# Patient Record
Sex: Female | Born: 1980 | Race: White | Hispanic: Yes | Marital: Single | Smoking: Never smoker
Health system: Southern US, Community
[De-identification: ages and names within clinical notes are randomized; demographics above are authoritative.]

## PROBLEM LIST (undated history)

## (undated) ENCOUNTER — Ambulatory Visit: Admission: EM

## (undated) DIAGNOSIS — R87629 Unspecified abnormal cytological findings in specimens from vagina: Secondary | ICD-10-CM

## (undated) DIAGNOSIS — R51 Headache: Secondary | ICD-10-CM

## (undated) DIAGNOSIS — O24419 Gestational diabetes mellitus in pregnancy, unspecified control: Secondary | ICD-10-CM

## (undated) DIAGNOSIS — E039 Hypothyroidism, unspecified: Secondary | ICD-10-CM

## (undated) DIAGNOSIS — R519 Headache, unspecified: Secondary | ICD-10-CM

## (undated) DIAGNOSIS — A609 Anogenital herpesviral infection, unspecified: Secondary | ICD-10-CM

## (undated) DIAGNOSIS — O139 Gestational [pregnancy-induced] hypertension without significant proteinuria, unspecified trimester: Secondary | ICD-10-CM

## (undated) DIAGNOSIS — D649 Anemia, unspecified: Secondary | ICD-10-CM

## (undated) HISTORY — DX: Anogenital herpesviral infection, unspecified: A60.9

---
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
## 1999-04-26 ENCOUNTER — Inpatient Hospital Stay (HOSPITAL_COMMUNITY): Admission: AD | Admit: 1999-04-26 | Discharge: 1999-04-26 | Payer: Self-pay | Admitting: *Deleted

## 1999-04-28 ENCOUNTER — Inpatient Hospital Stay (HOSPITAL_COMMUNITY): Admission: AD | Admit: 1999-04-28 | Discharge: 1999-04-28 | Payer: Self-pay | Admitting: Obstetrics

## 1999-05-01 ENCOUNTER — Inpatient Hospital Stay (HOSPITAL_COMMUNITY): Admission: AD | Admit: 1999-05-01 | Discharge: 1999-05-01 | Payer: Self-pay | Admitting: *Deleted

## 1999-05-10 ENCOUNTER — Inpatient Hospital Stay (HOSPITAL_COMMUNITY): Admission: AD | Admit: 1999-05-10 | Discharge: 1999-05-10 | Payer: Self-pay | Admitting: Obstetrics & Gynecology

## 1999-05-11 ENCOUNTER — Encounter (HOSPITAL_COMMUNITY): Admission: RE | Admit: 1999-05-11 | Discharge: 1999-05-17 | Payer: Self-pay | Admitting: *Deleted

## 1999-05-14 ENCOUNTER — Inpatient Hospital Stay (HOSPITAL_COMMUNITY): Admission: AD | Admit: 1999-05-14 | Discharge: 1999-05-20 | Payer: Self-pay | Admitting: Obstetrics

## 1999-05-14 ENCOUNTER — Encounter (INDEPENDENT_AMBULATORY_CARE_PROVIDER_SITE_OTHER): Payer: Self-pay

## 1999-05-22 ENCOUNTER — Inpatient Hospital Stay (HOSPITAL_COMMUNITY): Admission: AD | Admit: 1999-05-22 | Discharge: 1999-05-22 | Payer: Self-pay | Admitting: *Deleted

## 2001-06-23 ENCOUNTER — Encounter: Payer: Self-pay | Admitting: *Deleted

## 2001-06-23 ENCOUNTER — Ambulatory Visit (HOSPITAL_COMMUNITY): Admission: RE | Admit: 2001-06-23 | Discharge: 2001-06-23 | Payer: Self-pay | Admitting: *Deleted

## 2001-08-16 ENCOUNTER — Inpatient Hospital Stay (HOSPITAL_COMMUNITY): Admission: AD | Admit: 2001-08-16 | Discharge: 2001-08-16 | Payer: Self-pay | Admitting: *Deleted

## 2001-08-18 ENCOUNTER — Encounter: Payer: Self-pay | Admitting: *Deleted

## 2001-08-18 ENCOUNTER — Ambulatory Visit (HOSPITAL_COMMUNITY): Admission: RE | Admit: 2001-08-18 | Discharge: 2001-08-18 | Payer: Self-pay | Admitting: *Deleted

## 2001-09-29 ENCOUNTER — Ambulatory Visit (HOSPITAL_COMMUNITY): Admission: RE | Admit: 2001-09-29 | Discharge: 2001-09-29 | Payer: Self-pay | Admitting: *Deleted

## 2001-10-06 ENCOUNTER — Encounter (HOSPITAL_COMMUNITY): Admission: RE | Admit: 2001-10-06 | Discharge: 2001-10-22 | Payer: Self-pay | Admitting: *Deleted

## 2001-10-18 ENCOUNTER — Inpatient Hospital Stay (HOSPITAL_COMMUNITY): Admission: AD | Admit: 2001-10-18 | Discharge: 2001-10-18 | Payer: Self-pay | Admitting: *Deleted

## 2001-10-26 ENCOUNTER — Inpatient Hospital Stay (HOSPITAL_COMMUNITY): Admission: AD | Admit: 2001-10-26 | Discharge: 2001-10-30 | Payer: Self-pay | Admitting: *Deleted

## 2002-01-12 ENCOUNTER — Encounter (INDEPENDENT_AMBULATORY_CARE_PROVIDER_SITE_OTHER): Payer: Self-pay | Admitting: Specialist

## 2002-01-12 ENCOUNTER — Encounter: Admission: RE | Admit: 2002-01-12 | Discharge: 2002-01-12 | Payer: Self-pay | Admitting: *Deleted

## 2002-01-12 ENCOUNTER — Other Ambulatory Visit: Admission: RE | Admit: 2002-01-12 | Discharge: 2002-01-12 | Payer: Self-pay | Admitting: *Deleted

## 2004-03-07 ENCOUNTER — Other Ambulatory Visit: Admission: RE | Admit: 2004-03-07 | Discharge: 2004-03-07 | Payer: Self-pay | Admitting: Gynecology

## 2005-04-03 ENCOUNTER — Other Ambulatory Visit: Admission: RE | Admit: 2005-04-03 | Discharge: 2005-04-03 | Payer: Self-pay | Admitting: Gynecology

## 2007-01-13 ENCOUNTER — Inpatient Hospital Stay (HOSPITAL_COMMUNITY): Admission: AD | Admit: 2007-01-13 | Discharge: 2007-01-13 | Payer: Self-pay | Admitting: Obstetrics and Gynecology

## 2007-04-07 ENCOUNTER — Inpatient Hospital Stay (HOSPITAL_COMMUNITY): Admission: AD | Admit: 2007-04-07 | Discharge: 2007-04-07 | Payer: Self-pay | Admitting: Obstetrics and Gynecology

## 2007-05-09 ENCOUNTER — Inpatient Hospital Stay (HOSPITAL_COMMUNITY): Admission: AD | Admit: 2007-05-09 | Discharge: 2007-05-10 | Payer: Self-pay | Admitting: Obstetrics & Gynecology

## 2007-05-15 ENCOUNTER — Inpatient Hospital Stay (HOSPITAL_COMMUNITY): Admission: AD | Admit: 2007-05-15 | Discharge: 2007-05-15 | Payer: Self-pay | Admitting: Obstetrics and Gynecology

## 2007-06-01 ENCOUNTER — Inpatient Hospital Stay (HOSPITAL_COMMUNITY): Admission: RE | Admit: 2007-06-01 | Discharge: 2007-06-03 | Payer: Self-pay | Admitting: Obstetrics and Gynecology

## 2010-11-23 NOTE — Discharge Summary (Signed)
Marisa Campbell, Marisa Campbell              ACCOUNT NO.:  [id number]   MEDICAL RECORD NO.:  [id number]          PATIENT TYPE:  INP   LOCATION:  9166                          FACILITY:  [facility]   PHYSICIAN:  Gerrit Friends. Aldona Bar, M.D.   DATE OF BIRTH:  December 26, 1980   DATE OF ADMISSION:  05/09/2007  DATE OF DISCHARGE:  05/10/2007                               DISCHARGE SUMMARY   FINAL DIAGNOSES:  1. Intrauterine gestation at 36-1/2 weeks.  2. Threatened labor.   COMPLICATIONS:  None.   This 30 year old, G4, P 2-0-1-2, presents at 36-1/[redacted] weeks gestation in  questionable labor.  The patient was monitored in the hospital for about  9 hours with no change in her cervix. At this point, Dr. Aldona Bar realized  the patient was not actually in labor.  She was able to be sent home and  follow up in our office the next day for her routine office visit.   Labs:  On discharge, the patient had a hemoglobin of 12.4, white blood  cell count of 10.5 and platelets of 212,000.      Leilani Able, P.A.-C.      Gerrit Friends. Aldona Bar, M.D.  Electronically Signed    MB/MEDQ  D:  06/26/2007  T:  06/27/2007  Job:  [id number]

## 2010-11-23 NOTE — Op Note (Signed)
[facility]  Patient:    Marisa Campbell                      MRN: [id number] Proc. Date: 05/17/99 Adm. Date:  [id number] Attending:  Tammi Sou                           Operative Report  PREOPERATIVE DIAGNOSES:       1. Forty-week intrauterine pregnancy.                               2. Meconium-stained fluid.                               3. Chorioamnionitis.                               4. Arrested descent, persistent occiput posterior.  POSTOPERATIVE DIAGNOSES:      1. Forty-week intrauterine pregnancy.                               2. Meconium-stained fluid.                               3. Chorioamnionitis.                               4. Arrested descent, persistent occiput posterior.                               5. Large for gestational age infant.  PROCEDURE:                    Primary low transverse cesarean section via Pfannenstiel.  SURGEON:                      Roseanna Rainbow, M.D.                               Charles A. Clearance Coots, M.D.  ANESTHESIA:                   Epidural.  COMPLICATIONS:                None.  ESTIMATED BLOOD LOSS:         800 ccs.  FLUIDS:                       1500 cc lactated Ringers.  URINE OUTPUT:                 300 cc clear urine.  INDICATIONS:                  The patient is a 30 year old gravida 1, para 0 at 40 weeks induced for oligohydramnios.  Maximum dilatation fully dilated with descent to 0 to +1 station.  FINDINGS:                     Female  infant, cephalic, presentation occipitoposterior.  Suspect meconium of the cord.  Pediatrics were present at delivery.  Apgars were 9 and 9.  Weight 9 pounds.  Normal uterus, tubes and ovaries.  DESCRIPTION OF PROCEDURE:     The patient was taken to the operating room where her epidural anesthetic was found to be adequate.  She was then prepped and draped n the usual sterile fashion in the dorsal supine position with  leftward tilt.  A Pfannenstiel skin incision was then made with the scalpel and carried through to the underlying layer of fascia.  The fascia was entered in the midline and incision extended laterally with Mayo scissors.  Superior aspect of the fascial incision was then grasped with Kocher clamps, elevated and the underlying rectus muscles dissected off.  Attention was then turned to the inferior aspect of the incision which was manipulated in a similar fashion.  The rectus muscles were separated n the midline and the parietal peritoneum identified, tented up and entered sharply with Metzenbaum scissors.  The peritoneal incision was then extended superiorly and inferiorly and there was good visualization of the bladder.  The bladder blade as then inserted and the vesicouterine peritoneum identified ___ and entered sharply with Metzenbaum scissors.  The incision was extended laterally and the bladder lap created distally.  The bladder blade was then reinserted and the lower uterine segment incised in a transverse fashion with the scalpel.  The uterine incision was then extended laterally with bandage scissors.  The bladder blade was removed and the infants head delivered atraumatically with elevation from below. The nose and mouth were suctioned with DeLee suction and the cord was clamped and cut.  The infant was handed off the awaiting pediatricians.  Cord gases were sent.  The umbilical artery pH was 7.31.  The placenta was then removed and the uterus exteriorized and cleared of all clots and debris.  The uterine incision was noted to have two extensions, approximately 1 to 2 cm in length involving the lateral  lower margins of the uterine incision.  The uterine incision including these extensions was repaired with 0 Monocryl in running lock fashion.  Excellent hemostasis was noted.  The gutters were cleared of all clots and the fascia was  reapproximated with 0 Vicryl in a  running fashion.  The skin was closed with staples. The patient tolerated the procedure well. Sponge and needle counts were correct x 2.  The patient is taken to the PACU in stable condition. DD:  05/17/99 TD:  05/20/99 Job: [id number]

## 2010-11-23 NOTE — Discharge Summary (Signed)
[facility]  Patient:    Marisa Campbell, Marisa Campbell A Visit Number: [id number] MRN: [id number]          Service Type: OBS Location: 910A 9104 01 Attending Physician:  Enid Cutter Dictated by:   Emelda Fear, M.D. Admit Date:  10/26/2001 Discharge Date: 10/30/2001                             Discharge Summary  DATE OF BIRTH:                11-Nov-1980  PROCEDURES:                   Transfusion of two units of packed red blood                               cells.  CONSULTS:                     None.  DISCHARGE DIAGNOSES:          1. Intrauterine pregnancy at 39-6/[redacted] weeks                                  gestation.                               2. Active labor.                               3. Delivery of a viable female infant via normal                                  spontaneous vaginal delivery.                               4. Postpartum hemorrhage secondary to uterine                                  atony and periurethral lacerations.                               5. Uterine atony.                               6. Bilateral periurethral lacerations.                               7. Anemia, acute blood loss.                               8. Status post transfusion.  DISCHARGE MEDICATIONS:        1. Ibuprofen 600 mg one tablet p.o. q.6h.  p.r.n. pain.                               2. Prenatal vitamins with iron one tablet p.o.                                  q.d. while breast feeding.  HOSPITAL COURSE:              Mrs. Roger Shelter is a 30 year old, G2, P1-0-0-1, presenting at 39-6/[redacted] weeks gestation in active labor.  The patient with a previous cesarean section secondary to failure to descend and desires a trial of labor after cesarean section.  The patient underwent spontaneous rupture of membranes with clear fluid return.  Delivery of a viable female infant via normal spontaneous vaginal delivery.  Infant with  Apgars of 8 at one minute and 9 at five minutes.  The patient weighted 9 pounds 9 ounces.  The patient suffered a postpartum hemorrhage secondary to uterine atony and bilateral periurethral lacerations.  Fundal massage was performed to facilitate increased uterine tone.  The patient also received Pitocin and methergine IM times one with decreased bleeding.  Periurethral lacerations were repaired to facilitate hemostasis.  The patient was continued on methergine p.o. 24 hours after delivery.  Expected blood loss was 1 liter.  The patient with a profound anemia secondary to acute blood loss due to postpartum hemorrhage.  Postpartum day two, the patient with a hemoglobin of 5.7 and symptomatic, therefore the patient received two units of packed red blood cells.  Transfusion hemoglobin of 7.4.  The patient symptomatically improved following transfusion.  The patient is breast feeding and desires an intrauterine device for contraception.  The patient was discharged on postpartum day three in stable condition.  DISCHARGE INSTRUCTIONS:  ACTIVITY:                     No restrictions.  DIET:                         No restrictions.  INSTRUCTIONS:                 The patient is recommended to return to [facility] for development of fever, vomiting, severe abdominal pain, or increased bleeding.  FOLLOW-UP:                    The patient will follow up in six weeks for postpartum visit at [facility] Dictated by:   Emelda Fear, M.D. Attending Physician:  Enid Cutter DD:  10/30/01 TD:  10/31/01 Job: [id number]

## 2011-01-10 ENCOUNTER — Other Ambulatory Visit (HOSPITAL_COMMUNITY): Payer: Self-pay | Admitting: Endocrinology

## 2011-01-10 DIAGNOSIS — E049 Nontoxic goiter, unspecified: Secondary | ICD-10-CM

## 2011-01-14 ENCOUNTER — Other Ambulatory Visit (HOSPITAL_COMMUNITY): Payer: Self-pay

## 2011-01-15 ENCOUNTER — Ambulatory Visit (HOSPITAL_COMMUNITY)
Admission: RE | Admit: 2011-01-15 | Discharge: 2011-01-15 | Disposition: A | Discharge status: Home / Self Care | Payer: Self-pay | Source: Ambulatory Visit | Attending: Endocrinology | Admitting: Endocrinology

## 2011-01-15 DIAGNOSIS — E049 Nontoxic goiter, unspecified: Secondary | ICD-10-CM | POA: Insufficient documentation

## 2011-01-15 DIAGNOSIS — R1310 Dysphagia, unspecified: Secondary | ICD-10-CM | POA: Insufficient documentation

## 2011-04-16 LAB — CROSSMATCH
ABO/RH(D): AB POS
Antibody Screen: POSITIVE
DAT, IgG: NEGATIVE
Donor AG Type: NEGATIVE

## 2011-04-16 LAB — TYPE AND SCREEN
Antibody Screen: POSITIVE
DAT, IgG: NEGATIVE
Donor AG Type: NEGATIVE

## 2011-04-16 LAB — CBC
HCT: 28.6 — ABNORMAL LOW
HCT: 35 — ABNORMAL LOW
Hemoglobin: 12
Hemoglobin: 12.4
Platelets: 179
RBC: 3.98
RDW: 13.7
WBC: 7.3
WBC: 9.1

## 2011-04-18 LAB — URINALYSIS, ROUTINE W REFLEX MICROSCOPIC
Bilirubin Urine: NEGATIVE
Ketones, ur: NEGATIVE
Nitrite: NEGATIVE
Protein, ur: NEGATIVE
Urobilinogen, UA: 0.2

## 2011-04-23 LAB — LIPASE, BLOOD: Lipase: 14

## 2011-04-23 LAB — COMPREHENSIVE METABOLIC PANEL
Alkaline Phosphatase: 41
BUN: 5 — ABNORMAL LOW
CO2: 23
GFR calc non Af Amer: >60
Glucose, Bld: 86
Potassium: 3.8
Total Protein: 6.4

## 2011-04-23 LAB — URINALYSIS, ROUTINE W REFLEX MICROSCOPIC
Glucose, UA: NEGATIVE
Hgb urine dipstick: NEGATIVE
Ketones, ur: 15 — AB
Protein, ur: NEGATIVE

## 2011-04-23 LAB — CBC
HCT: 35.4 — ABNORMAL LOW
Hemoglobin: 12.1
MCHC: 34.1
RDW: 14.8 — ABNORMAL HIGH

## 2011-10-25 ENCOUNTER — Other Ambulatory Visit (HOSPITAL_COMMUNITY): Payer: Self-pay | Admitting: *Deleted

## 2011-10-25 DIAGNOSIS — E669 Obesity, unspecified: Secondary | ICD-10-CM

## 2011-10-25 DIAGNOSIS — E0590 Thyrotoxicosis, unspecified without thyrotoxic crisis or storm: Secondary | ICD-10-CM

## 2011-11-01 ENCOUNTER — Other Ambulatory Visit (HOSPITAL_COMMUNITY): Payer: Self-pay

## 2012-06-01 ENCOUNTER — Other Ambulatory Visit (HOSPITAL_COMMUNITY): Payer: Self-pay | Admitting: *Deleted

## 2012-06-01 DIAGNOSIS — E669 Obesity, unspecified: Secondary | ICD-10-CM

## 2012-06-01 DIAGNOSIS — E0590 Thyrotoxicosis, unspecified without thyrotoxic crisis or storm: Secondary | ICD-10-CM

## 2012-06-09 ENCOUNTER — Ambulatory Visit (HOSPITAL_COMMUNITY)
Admission: RE | Admit: 2012-06-09 | Discharge: 2012-06-09 | Disposition: A | Discharge status: Home / Self Care | Payer: Self-pay | Source: Ambulatory Visit | Attending: *Deleted | Admitting: *Deleted

## 2012-06-09 DIAGNOSIS — E039 Hypothyroidism, unspecified: Secondary | ICD-10-CM | POA: Insufficient documentation

## 2012-06-09 DIAGNOSIS — E049 Nontoxic goiter, unspecified: Secondary | ICD-10-CM | POA: Insufficient documentation

## 2012-06-09 DIAGNOSIS — E669 Obesity, unspecified: Secondary | ICD-10-CM

## 2012-06-09 DIAGNOSIS — E0590 Thyrotoxicosis, unspecified without thyrotoxic crisis or storm: Secondary | ICD-10-CM

## 2016-05-17 DIAGNOSIS — R87612 Low grade squamous intraepithelial lesion on cytologic smear of cervix (LGSIL): Secondary | ICD-10-CM | POA: Insufficient documentation

## 2016-05-24 DIAGNOSIS — N870 Mild cervical dysplasia: Secondary | ICD-10-CM | POA: Insufficient documentation

## 2017-03-25 LAB — OB RESULTS CONSOLE GC/CHLAMYDIA
Chlamydia: NEGATIVE
GC PROBE AMP, GENITAL: NEGATIVE

## 2017-03-25 LAB — OB RESULTS CONSOLE ABO/RH: RH TYPE: POSITIVE

## 2017-03-25 LAB — OB RESULTS CONSOLE RPR: RPR: NONREACTIVE

## 2017-03-25 LAB — OB RESULTS CONSOLE HIV ANTIBODY (ROUTINE TESTING): HIV: NONREACTIVE

## 2017-03-25 LAB — OB RESULTS CONSOLE ANTIBODY SCREEN: Antibody Screen: POSITIVE

## 2017-03-25 LAB — OB RESULTS CONSOLE RUBELLA ANTIBODY, IGM: Rubella: IMMUNE

## 2017-03-25 LAB — OB RESULTS CONSOLE HEPATITIS B SURFACE ANTIGEN: Hepatitis B Surface Ag: NEGATIVE

## 2017-05-12 ENCOUNTER — Encounter (HOSPITAL_COMMUNITY): Payer: Self-pay

## 2017-05-13 ENCOUNTER — Other Ambulatory Visit (HOSPITAL_COMMUNITY): Payer: Self-pay

## 2017-05-13 ENCOUNTER — Ambulatory Visit (HOSPITAL_COMMUNITY)
Admission: RE | Admit: 2017-05-13 | Discharge: 2017-05-13 | Disposition: A | Discharge status: Home / Self Care | Payer: Medicaid Other | Source: Ambulatory Visit | Attending: Obstetrics and Gynecology | Admitting: Obstetrics and Gynecology

## 2017-05-13 ENCOUNTER — Encounter (HOSPITAL_COMMUNITY): Payer: Self-pay | Admitting: *Deleted

## 2017-05-13 DIAGNOSIS — O09522 Supervision of elderly multigravida, second trimester: Secondary | ICD-10-CM | POA: Insufficient documentation

## 2017-05-13 DIAGNOSIS — E039 Hypothyroidism, unspecified: Secondary | ICD-10-CM | POA: Diagnosis not present

## 2017-05-13 DIAGNOSIS — O99282 Endocrine, nutritional and metabolic diseases complicating pregnancy, second trimester: Secondary | ICD-10-CM | POA: Insufficient documentation

## 2017-05-13 DIAGNOSIS — O360920 Maternal care for other rhesus isoimmunization, second trimester, not applicable or unspecified: Secondary | ICD-10-CM | POA: Insufficient documentation

## 2017-05-13 DIAGNOSIS — Z3A19 19 weeks gestation of pregnancy: Secondary | ICD-10-CM | POA: Insufficient documentation

## 2017-05-13 DIAGNOSIS — O361110 Maternal care for Anti-A sensitization, first trimester, not applicable or unspecified: Secondary | ICD-10-CM | POA: Insufficient documentation

## 2017-05-13 DIAGNOSIS — O99012 Anemia complicating pregnancy, second trimester: Secondary | ICD-10-CM | POA: Insufficient documentation

## 2017-05-13 HISTORY — DX: Headache: R51

## 2017-05-13 HISTORY — DX: Hypothyroidism, unspecified: E03.9

## 2017-05-13 HISTORY — DX: Anemia, unspecified: D64.9

## 2017-05-13 HISTORY — DX: Headache, unspecified: R51.9

## 2017-05-13 HISTORY — DX: Unspecified abnormal cytological findings in specimens from vagina: R87.629

## 2017-05-13 NOTE — Consult Note (Signed)
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 She had this with her last pregnancy and apparently had low titers throughout. Baby did have moderate to severe neonatal jaundice. She probably was isoimmunized after blood transfusion after her second child OB History: OB History    Gravida Para Term Preterm AB Living   6 3 3   1 3    SAB TAB Ectopic Multiple Live Births   1              PMH:  Past Medical History:  Diagnosis Date  . Anemia   . Headache   . Hypothyroidism   . Vaginal Pap smear, abnormal     PSH:  Past Surgical History:  Procedure Laterality Date  . CESAREAN SECTION     Meds: PNV Allergies: NKDA FH: See EPIC Section Soc: See EPIC Section  Review of Systems: no vaginal bleeding or cramping/contractions, no LOF, no nausea/vomiting. All other systems reviewed and are negative.  PE:  VS: See EPIC Section GEN: well-appearing female ABD: gravid, NT  A/P: AMA: recommend some form of testing; she is a candidate for cell free DNA testing. If desired, we would be glad to have her see our genetic counselor Anti-c isoimmunization: FOB refuses testing for antigen status. At this time the titer is low and does not need middle cerebral artery doppler studies. These would be required if titer reached 8 or greater. Recommend checking titer again at 17 weeks. If >8 then we can begin MCA dopplers as early as 18 weeks. If it remains <8 then titers every 4 weeks are sufficient, with a referral for MCA dopplers if it ever becomes 8 or greater.  Thank you for the opportunity to be a part of the care of [employer] Motor [employer]ebecca A Dehaven. Please contact our office if we can be of further assistance.   I spent approximately 30 minutes with this patient with over 50% of time spent in face-to-face counseling.

## 2017-05-16 ENCOUNTER — Other Ambulatory Visit (HOSPITAL_COMMUNITY): Payer: Self-pay

## 2017-05-16 ENCOUNTER — Encounter (HOSPITAL_COMMUNITY): Payer: Self-pay

## 2017-07-08 NOTE — L&D Delivery Note (Signed)
Delivery Note At 3:54 AM a viable and healthy female was delivered via Vaginal, Spontaneous (Presentation: Occiput; Anterior ). Pt had precipitous delivery.  APGAR: 8, 9; weight pending .   Placenta status: spontaneous, intact.  Cord: 3V with the following complications: .    Anesthesia:  None Episiotomy: None Lacerations: None Suture Repair: NA Est. Blood Loss (mL): 250  Mom to postpartum.  Baby to Couplet care / Skin to Skin.  Waynard Reeds 11/08/2017, 4:16 AM

## 2017-07-22 DIAGNOSIS — B001 Herpesviral vesicular dermatitis: Secondary | ICD-10-CM | POA: Insufficient documentation

## 2017-10-15 LAB — OB RESULTS CONSOLE GBS: STREP GROUP B AG: NEGATIVE

## 2017-11-06 ENCOUNTER — Encounter (HOSPITAL_COMMUNITY): Payer: Self-pay | Admitting: *Deleted

## 2017-11-06 ENCOUNTER — Other Ambulatory Visit: Payer: Self-pay | Admitting: Obstetrics and Gynecology

## 2017-11-06 ENCOUNTER — Telehealth (HOSPITAL_COMMUNITY): Payer: Self-pay | Admitting: *Deleted

## 2017-11-06 NOTE — Telephone Encounter (Signed)
Preadmission screen  

## 2017-11-07 ENCOUNTER — Inpatient Hospital Stay (HOSPITAL_COMMUNITY): Admission: RE | Admit: 2017-11-07 | Payer: Medicaid Other | Source: Ambulatory Visit

## 2017-11-08 ENCOUNTER — Inpatient Hospital Stay (HOSPITAL_COMMUNITY)
Admission: AD | Admit: 2017-11-08 | Discharge: 2017-11-10 | DRG: 807 | Disposition: A | Discharge status: Home / Self Care | Payer: Medicaid Other | Source: Ambulatory Visit | Attending: Obstetrics and Gynecology | Admitting: Obstetrics and Gynecology

## 2017-11-08 ENCOUNTER — Encounter (HOSPITAL_COMMUNITY): Payer: Self-pay

## 2017-11-08 DIAGNOSIS — O34219 Maternal care for unspecified type scar from previous cesarean delivery: Secondary | ICD-10-CM | POA: Diagnosis present

## 2017-11-08 DIAGNOSIS — Z3A39 39 weeks gestation of pregnancy: Secondary | ICD-10-CM | POA: Diagnosis not present

## 2017-11-08 DIAGNOSIS — E039 Hypothyroidism, unspecified: Secondary | ICD-10-CM | POA: Diagnosis present

## 2017-11-08 DIAGNOSIS — Z3483 Encounter for supervision of other normal pregnancy, third trimester: Secondary | ICD-10-CM | POA: Diagnosis present

## 2017-11-08 DIAGNOSIS — O99284 Endocrine, nutritional and metabolic diseases complicating childbirth: Secondary | ICD-10-CM | POA: Diagnosis present

## 2017-11-08 LAB — CBC
HCT: 31.5 % — ABNORMAL LOW (ref 36.0–46.0)
HCT: 34.9 % — ABNORMAL LOW (ref 36.0–46.0)
Hemoglobin: 11.1 g/dL — ABNORMAL LOW (ref 12.0–15.0)
Hemoglobin: 12.2 g/dL (ref 12.0–15.0)
MCH: 29.6 pg (ref 26.0–34.0)
MCH: 29.8 pg (ref 26.0–34.0)
MCHC: 35 g/dL (ref 30.0–36.0)
MCHC: 35.2 g/dL (ref 30.0–36.0)
MCV: 84 fL (ref 78.0–100.0)
MCV: 85.1 fL (ref 78.0–100.0)
PLATELETS: 167 10*3/uL (ref 150–400)
PLATELETS: 173 10*3/uL (ref 150–400)
RBC: 3.75 MIL/uL — ABNORMAL LOW (ref 3.87–5.11)
RBC: 4.1 MIL/uL (ref 3.87–5.11)
RDW: 13.5 % (ref 11.5–15.5)
RDW: 13.6 % (ref 11.5–15.5)
WBC: 10.3 10*3/uL (ref 4.0–10.5)
WBC: 8.3 10*3/uL (ref 4.0–10.5)

## 2017-11-08 LAB — TYPE AND SCREEN
ABO/RH(D): AB POS
ANTIBODY SCREEN: POSITIVE

## 2017-11-08 LAB — RPR: RPR Ser Ql: NONREACTIVE

## 2017-11-08 MED ORDER — BUTORPHANOL TARTRATE 1 MG/ML IJ SOLN
1.0000 mg | INTRAMUSCULAR | Status: DC | PRN
  Administered 2017-11-08: 1 mg via INTRAVENOUS
  Filled 2017-11-08 (×2): qty 1

## 2017-11-08 MED ORDER — LEVOTHYROXINE SODIUM 150 MCG PO TABS
150.0000 ug | ORAL_TABLET | Freq: Every day | ORAL | Status: DC
  Administered 2017-11-08 – 2017-11-10 (×3): 150 ug via ORAL
  Filled 2017-11-08 (×4): qty 1

## 2017-11-08 MED ORDER — SOD CITRATE-CITRIC ACID 500-334 MG/5ML PO SOLN
30.0000 mL | ORAL | Status: DC | PRN
  Administered 2017-11-08: 30 mL via ORAL
  Filled 2017-11-08: qty 15

## 2017-11-08 MED ORDER — OXYCODONE-ACETAMINOPHEN 5-325 MG PO TABS: 1.0000 | ORAL_TABLET | ORAL | Status: DC | PRN

## 2017-11-08 MED ORDER — COCONUT OIL OIL
1.0000 "application " | TOPICAL_OIL | Status: DC | PRN
  Administered 2017-11-10: 1 via TOPICAL
  Filled 2017-11-08: qty 120

## 2017-11-08 MED ORDER — PRENATAL MULTIVITAMIN CH
1.0000 | ORAL_TABLET | Freq: Every day | ORAL | Status: DC
  Administered 2017-11-08 – 2017-11-10 (×3): 1 via ORAL
  Filled 2017-11-08 (×3): qty 1

## 2017-11-08 MED ORDER — BUTORPHANOL TARTRATE 1 MG/ML IJ SOLN
1.0000 mg | INTRAMUSCULAR | Status: DC | PRN
  Administered 2017-11-08: 1 mg via INTRAVENOUS

## 2017-11-08 MED ORDER — OXYCODONE-ACETAMINOPHEN 5-325 MG PO TABS: 2.0000 | ORAL_TABLET | ORAL | Status: DC | PRN

## 2017-11-08 MED ORDER — METHYLERGONOVINE MALEATE 0.2 MG PO TABS: 0.2000 mg | ORAL_TABLET | ORAL | Status: DC | PRN

## 2017-11-08 MED ORDER — SIMETHICONE 80 MG PO CHEW: 80.0000 mg | CHEWABLE_TABLET | ORAL | Status: DC | PRN

## 2017-11-08 MED ORDER — BENZOCAINE-MENTHOL 20-0.5 % EX AERO: 1.0000 "application " | INHALATION_SPRAY | CUTANEOUS | Status: DC | PRN

## 2017-11-08 MED ORDER — LIDOCAINE HCL (PF) 1 % IJ SOLN
30.0000 mL | INTRAMUSCULAR | Status: DC | PRN
  Filled 2017-11-08: qty 30

## 2017-11-08 MED ORDER — ACETAMINOPHEN 325 MG PO TABS: 650.0000 mg | ORAL_TABLET | ORAL | Status: DC | PRN

## 2017-11-08 MED ORDER — SENNOSIDES-DOCUSATE SODIUM 8.6-50 MG PO TABS
2.0000 | ORAL_TABLET | ORAL | Status: DC
  Administered 2017-11-09 (×2): 2 via ORAL
  Filled 2017-11-08 (×2): qty 2

## 2017-11-08 MED ORDER — METHYLERGONOVINE MALEATE 0.2 MG/ML IJ SOLN: 0.2000 mg | INTRAMUSCULAR | Status: DC | PRN

## 2017-11-08 MED ORDER — ZOLPIDEM TARTRATE 5 MG PO TABS: 5.0000 mg | ORAL_TABLET | Freq: Every evening | ORAL | Status: DC | PRN

## 2017-11-08 MED ORDER — IBUPROFEN 600 MG PO TABS
600.0000 mg | ORAL_TABLET | Freq: Four times a day (QID) | ORAL | Status: DC
  Administered 2017-11-08 – 2017-11-10 (×9): 600 mg via ORAL
  Filled 2017-11-08 (×11): qty 1

## 2017-11-08 MED ORDER — ONDANSETRON HCL 4 MG/2ML IJ SOLN: 4.0000 mg | INTRAMUSCULAR | Status: DC | PRN

## 2017-11-08 MED ORDER — TETANUS-DIPHTH-ACELL PERTUSSIS 5-2.5-18.5 LF-MCG/0.5 IM SUSP: 0.5000 mL | Freq: Once | INTRAMUSCULAR | Status: DC

## 2017-11-08 MED ORDER — LACTATED RINGERS IV SOLN
INTRAVENOUS | Status: DC
  Administered 2017-11-08: 01:00:00 via INTRAVENOUS

## 2017-11-08 MED ORDER — ACETAMINOPHEN 325 MG PO TABS
650.0000 mg | ORAL_TABLET | ORAL | Status: DC | PRN
  Administered 2017-11-08 – 2017-11-09 (×2): 650 mg via ORAL
  Filled 2017-11-08 (×2): qty 2

## 2017-11-08 MED ORDER — ONDANSETRON HCL 4 MG/2ML IJ SOLN: 4.0000 mg | Freq: Four times a day (QID) | INTRAMUSCULAR | Status: DC | PRN

## 2017-11-08 MED ORDER — DIBUCAINE 1 % RE OINT: 1.0000 "application " | TOPICAL_OINTMENT | RECTAL | Status: DC | PRN

## 2017-11-08 MED ORDER — OXYTOCIN BOLUS FROM INFUSION
500.0000 mL | Freq: Once | INTRAVENOUS | Status: AC
  Administered 2017-11-08: 500 mL via INTRAVENOUS

## 2017-11-08 MED ORDER — ONDANSETRON HCL 4 MG PO TABS: 4.0000 mg | ORAL_TABLET | ORAL | Status: DC | PRN

## 2017-11-08 MED ORDER — WITCH HAZEL-GLYCERIN EX PADS: 1.0000 "application " | MEDICATED_PAD | CUTANEOUS | Status: DC | PRN

## 2017-11-08 MED ORDER — DIPHENHYDRAMINE HCL 25 MG PO CAPS: 25.0000 mg | ORAL_CAPSULE | Freq: Four times a day (QID) | ORAL | Status: DC | PRN

## 2017-11-08 MED ORDER — OXYTOCIN 40 UNITS IN LACTATED RINGERS INFUSION - SIMPLE MED
2.5000 [IU]/h | INTRAVENOUS | Status: DC
  Filled 2017-11-08: qty 1000

## 2017-11-08 MED ORDER — FLEET ENEMA 7-19 GM/118ML RE ENEM: 1.0000 | ENEMA | RECTAL | Status: DC | PRN

## 2017-11-08 MED ORDER — LACTATED RINGERS IV SOLN: 500.0000 mL | INTRAVENOUS | Status: DC | PRN

## 2017-11-08 NOTE — H&P (Signed)
Marisa Campbell is a 37 y.o. female presenting for labor  37 yo [id number] @ 39+3 presents for labor. 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 Her husband declined to have his blood tested to see if he was a carrier of either of these antigens. She was followed with serial titers. The highest her titer got was 1:4. She has a history of prior C/S and then 2 successful VBACs OB History    Gravida  6   Para  3   Term  3   Preterm      AB  2   Living  3     SAB  1   TAB  1   Ectopic      Multiple      Live Births  3          Past Medical History:  Diagnosis Date  . Anemia   . Headache   . HSV (herpes simplex virus) anogenital infection   . Hypothyroidism   . Vaginal Pap smear, abnormal    Past Surgical History:  Procedure Laterality Date  . CESAREAN SECTION     Family History: family history includes Hypertension in her father. Social History:  reports that she has never smoked. She has never used smokeless tobacco. She reports that she does not drink alcohol or use drugs.     Maternal Diabetes: No Genetic Screening: Declined Maternal Ultrasounds/Referrals: Normal Fetal Ultrasounds or other Referrals:  Referred to Materal Fetal Medicine  Maternal Substance Abuse:  No Significant Maternal Medications:  Meds include: Syntroid Significant Maternal Lab Results:  Lab values include: Other: Anti little c, Anti E Other Comments:  None  ROS History Dilation: 10 Effacement (%): 80 Station: -2 Exam by:: K.Wilson,RN Blood pressure (!) 142/79, pulse 65, temperature 98.7 F (37.1 C), temperature source Oral, resp. rate 18, last menstrual period 02/05/2017. Exam Physical Exam  Prenatal labs: ABO, Rh: --/--/AB POS (05/04 0102) Antibody: POS (05/04 0102) Rubella: Immune (09/18 0000) RPR: Nonreactive (09/18 0000)  HBsAg: Negative (09/18 0000)  HIV: Non-reactive (09/18 0000)  GBS:  Negative (04/10 0000)   Assessment/Plan: 1) Admit 2) Epidural on request 3) TOLAC   Waynard Reeds 11/08/2017, 4:08 AM

## 2017-11-08 NOTE — MAU Note (Signed)
Pt reports ctx since this morning. Closer now. 5cm in office/.Reports she had some blood when wiping. Good fetal movement

## 2017-11-09 NOTE — Lactation Note (Signed)
This note was copied from a baby's chart. Lactation Consultation Note  Patient Name: Marisa Campbell [name] Date: 11/09/2017    [facility] Visit for NICU baby: Mother not in bed and support person asleep: will try to check back later.         Marisa Campbell 11/09/2017, 12:45 AM

## 2017-11-09 NOTE — Progress Notes (Signed)
Post Partum Day 0 Subjective: no complaints, up ad lib, voiding and tolerating PO  Objective: Blood pressure 118/76, pulse (!) 56, temperature 98 F (36.7 C), temperature source Oral, resp. rate 20, last menstrual period 02/05/2017, SpO2 100 %.  Physical Exam:  General: alert, cooperative and appears stated age Lochia: appropriate Uterine Fundus: firm   Recent Labs    11/08/17 0050 11/08/17 0726  HGB 12.2 11.1*  HCT 34.9* 31.5*    Assessment/Plan: Routine PP care Baby in NICU due to severely elevated bilirubin   LOS: 1 day   Waynard Reeds 11/09/2017, 12:02 PM

## 2017-11-09 NOTE — Lactation Note (Signed)
This note was copied from a baby's chart. Lactation Consultation Note  Patient Name: Marisa Campbell [name] Date: 11/09/2017   [facility] Visit for NICU Baby:  Second attempt to visit with mother. She is not currently in her room.               [place]      Irene Pap Torion Hulgan 11/09/2017, 3:32 AM

## 2017-11-10 ENCOUNTER — Encounter (HOSPITAL_COMMUNITY): Payer: Self-pay | Admitting: *Deleted

## 2017-11-10 MED ORDER — POLYETHYLENE GLYCOL 3350 17 G PO PACK: 17.0000 g | PACK | Freq: Every day | ORAL | 0 refills | Status: DC

## 2017-11-10 MED ORDER — POLYETHYLENE GLYCOL 3350 17 G PO PACK
17.0000 g | PACK | Freq: Every day | ORAL | Status: DC
  Administered 2017-11-10: 17 g via ORAL
  Filled 2017-11-10 (×2): qty 1

## 2017-11-10 NOTE — Progress Notes (Signed)
Patient is doing well.  She is ambulating, voiding, tolerating PO.  Pain control is good.  Lochia is appropriate Reports baby's bilirubin improving in NICU  Vitals:   11/08/17 0640 11/08/17 1117 11/08/17 1820 11/09/17 0542  BP: 128/73 (!) 102/54 113/74 118/76  Pulse: (!) 57 66 68 (!) 56  Resp: Temp: 97.8 F (36.6 C) 98.8 F (37.1 C) 98.2 F (36.8 C) 98 F (36.7 C)  TempSrc: Oral Oral Axillary Oral  SpO2:   100%     NAD Fundus firm Ext: trace edema b/l  Lab Results  Component Value Date   WBC 10.3 11/08/2017   HGB 11.1 (L) 11/08/2017   HCT 31.5 (L) 11/08/2017   MCV 84.0 11/08/2017   PLT 173 11/08/2017    --/--/AB POS (05/04 0102)/RImmune  A/P 36 y.o. [id number] PPD#2. Routine care.   Meeting all goals--d/c to home today    [facility] GEFFEL Norman

## 2017-11-10 NOTE — Lactation Note (Addendum)
This note was copied from a baby's chart. Lactation Consultation Note  Patient Name: Marisa Campbell [name] Date: 11/10/2017 Reason for consult: Initial assessment;NICU baby;Maternal endocrine disorder Type of Endocrine Disorder?: Thyroid(Levothyroxine)   Initial assessment with Exp BF mom of 57 hour old NICU infant. Mom is pumping about every 3 hours and getting about 20 mol of EBM /pumping. Mom is concerned that her milk is not as abundant as it was with there first 2 children. She was diagnosed with Thyroid issues after 2nd child. She reports she has not had her levels checked in a while.   Mom is feeling fuller but no signs of engorgement today. Mom to tell nurses if she gets to feeling fuller to ask for ice.   Mom reports her nipples are sore with initial pumping. She does have a red ring around her nipple. She is using # 27 flanges and are thought to be the right fit for her. Enc mom to use Coconut oil to nipples prior to pumping and EBM and comfort gels to nipples post pumping/feeding. Comfort gels given with instructions for use.   Mom is planning to apply for [facility], spoke with [facility] reps and asked them to stop by and see mom about applying for [facility]. Attempted to fax [facility] referral, would not send at this time, will resend later today.   BF Resources handout and LC Brochure given, mom informed of IP/OP Services, BF Support Groups and LC phone #. Providing Milk for your Infant in NICU booklet given,   Mom was called to NICU to feed infant. She reports all questions/concenrs have been answered. Report to Wonda Horner, Charity fundraiser. Susie to take mom Coconut oil when mom returns.    Maternal Data Formula Feeding for Exclusion: No Has patient been taught Hand Expression?: Yes Does the patient have breastfeeding experience prior to this delivery?: Yes  Feeding    LATCH Score                   Interventions    Lactation Tools Discussed/Used WIC Program: No Pump Review: Setup,  frequency, and cleaning;Milk Storage Initiated by:: Reviewed and encouraged 8-12 x in 24 hours   Consult Status Consult Status: Follow-up Date: 11/11/17 Follow-up type: In-patient    Marisa Campbell Marisa Campbell 11/10/2017, 1:41 PM

## 2017-11-10 NOTE — Discharge Summary (Signed)
Obstetric Discharge Summary Reason for Admission: onset of labor Prenatal Procedures: none Intrapartum Procedures: spontaneous vaginal delivery Postpartum Procedures: none Complications-Operative and Postpartum: none Hemoglobin  Date Value Ref Range Status  11/08/2017 11.1 (L) 12.0 - 15.0 g/dL Final   HCT  Date Value Ref Range Status  11/08/2017 31.5 (L) 36.0 - 46.0 % Final    Physical Exam:  General: alert, cooperative and appears stated age 37: appropriate Uterine Fundus: firm DVT Evaluation: No evidence of DVT seen on physical exam.  Discharge Diagnoses: Term Pregnancy-delivered  Discharge Information: Date: 11/10/2017 Activity: pelvic rest Diet: routine Medications: PNV and Ibuprofen Condition: stable Instructions: refer to practice specific booklet Discharge to: home Follow-up Information    Marisa Reeds, Marisa Campbell Follow up in 4 week(s).   Specialty:  Obstetrics and Gynecology Contact information: [address] [place] [address] [phone]           Newborn Data: Live born female  Birth Weight: 8 lb 3.8 oz (3735 g) APGAR: 8, 9  Newborn Delivery   Birth date/time:  11/08/2017 03:54:00 Delivery type:  Vaginal, Spontaneous     Baby in  NICU w elevated bilirubin  Marisa Campbell Marisa Campbell 11/10/2017, 9:24 AM

## 2017-12-04 ENCOUNTER — Ambulatory Visit: Payer: Self-pay

## 2017-12-04 NOTE — Lactation Note (Addendum)
This note was copied from a baby's chart.  12/04/2017  Name: Marisa Campbell MRN: [id number] Date of Birth: 11/08/2018 Gestational Age: Gestational Age: [redacted]w[redacted]d Birth Weight: 131.8 oz Weight today:    9 pounds 1.3 ounces (4120 grams) with clean newborn diaper  Infant has gained 150 grams in the last 23 days with an average daily weight gain of 6 grams a day. Mom reports infant's lowest weight was 8 pounds 4 ounces on 5/17 at the [facility] office. With that in mind infant has gained 9 ounces in 13 days with an average daily weight gain of 0.7 ounces a day.   Marisa Campbell presents today with mom for feeding assessment due to weight gain. Mom is concerned that infant was not getting enough food. She reports since beginning Fenugreek last week she has noticed that infant is more satisfied and is sleeping better. Mom reports infant was not stooling last week but stools have increased in the last several days. Infant last ate at 7 am.   Mom reports infant is sleepy at the breast and takes up to an hour to feed. Enc mom to keep infant awake at the breast and to massage/compress breast with feeding.   Mom is pumping once a day with a manual pump and getting about an ounce, she is feeding to infant along with 1 bottle of formula bottle once a day with a Dr. Theora Gianotti bottle. Usually given in the evening when dad is at home. Mom has manual pump and has not been able to get to [facility] to get a DEBP due to taking care of sick family member.   Infant with thick labial frenulum that inserts at the bottom of the gum ridge. Upper lip is tight with flanging and on the breast. Infant with limited mid tongue elevation and bowl shaped tongue with crying. Infant with good tongue extension and lateralization. Mom's right nipple is asymmetrical after latch. Infant tongue thrusting and chomping on gloved finger, infant with poor seal on gloved finger with easy release with tension. Infant with hump to back of tongue when suckling  on gloved finger. Reviewed how tongue and lip restrictions may effect BF. Mom given website and local provider information. Mom reports 2 of her older children had to have tongue tie revised by Pediatrician.   Mom latched infant to left breast in the football hold. Infant sleepy at the breast and needed stimulation to maintain suckling. Infant transferred 40 ml on the left breast over 20 minutes. Infant was then latched to the right breast, he was more unsettled on the right breast. Infant transferred 8 ml on the right breast. He became fussy and did not want to continue feeding  Reviewed with mom that infant weight gain is not optimal, enc mom to offer bottle of EBM or formula post BF if infant still cueing to feed. Gave mom 5 french feeding tube to try at home with instructions for use and cleaning. Enc mom to offer supplement with each feeding until weight gain pattern improves. Strongly advised mom to rent DEBP. Mom rented [facility] loaner today. Enc mom to pump to stimulate supply and use EBM to feed infant.   Mom reports she is unsure how long ago her Thyroid levels were checked, mom to speak with OB at her follow up visit on June 3rd.   Infant to follow up with Pediatrician on June 12 th. Infant to follow up with Lactation in 1 week or 1-2 days post revisions if completed.  General Information: Mother's reason for visit: Feeding assessment, milk supply issues Consult: Initial Lactation consultant: Noralee Stain RN,IBCLC Breastfeeding experience: going better since starting Fenugreek, sleepy at the breast Maternal medical conditions: Thyroid(Hypothyroidism) Maternal medications: Pre-natal vitamin, Other(Fenugreek 610 ml 3 capsules TID, Levothyroxine 175 mcg qd)  Breastfeeding History: Frequency of breast feeding: every 2 hours Duration of feeding: 45- 1 hour  Supplementation: Supplement method: bottle(Dr. Brown's ) Brand: Enfamil Formula volume: 2-4 oz Formula frequency: 1 x a  day Total formula volume per day: 2-4 ounces Breast milk volume: 1 ounce  Breast milk frequency: once a day   Pump type: Manual Pump frequency: 1 x a day Pump volume: 1 oz  Infant Output Assessment: Voids per 24 hours: 8+ Urine color: Clear yellow Stools per 24 hours: 8+ Stool color: Yellow  Breast Assessment: Breast: Soft Nipple: Erect Pain level: 0 Pain interventions: Bra  Feeding Assessment: Infant oral assessment: Variance Infant oral assessment comment: Infant with thick labial frenulum that inserts at the bottom of the gum ridge. Upper lip is tight with flanging and on the breast. Infant with limited mid tongue elevation and bowl shaped tongue with crying. Infant with good tongue extension and lateralization. Mom's right nipple is asymmetrical after latch. Infant tongue thrusting and chomping on gloved finger, infant with poor seal on gloved finger with easy release with tension. Infant with hump to back of tongue when suckling on gloved finger. Reviewed how tongue and lip restrictions may effect BF.  Positioning: Cradle Latch: 2 - Grasps breast easily, tongue down, lips flanged, rhythmical sucking. Audible swallowing: 2 - Spontaneous and intermittent Type of nipple: 2 - Everted at rest and after stimulation Comfort: 2 - Soft/non-tender Hold: 2 - No assistance needed to correctly position infant at breast LATCH score: 10 Latch assessment: Deep Lips flanged: No(upper lip tight at the breast) Suck assessment: Displays both   Pre-feed weight: 4120 grams Post feed weight: 4160 grams Amount transferred: 40 ml Amount supplemented: 0  Additional Feeding Assessment: Infant oral assessment: Variance Infant oral assessment comment: see above Positioning: Cradle Latch: 1 - Repeated attempts neede to sustain latch, nipple held in mouth throughout feeding, stimulation needed to elicit sucking reflex. Audible swallowing: 2 - Spontaneous and intermittent Type of nipple: 2 -  Everted at rest and after stimulation Comfort: 2 - Soft/non-tender Hold: 2 - No assistance needed to correctly position infant at breast LATCH score: 9 Latch assessment: Deep Lips flanged: No Suck assessment: Displays both   Pre-feed weight: 4160 grams /4152 grams post diaper change Post feed weight: 4168 grams/4158 grams post diaper change Amount transferred: 8 ml/6 ml Amount supplemented: 14 ml  Totals: Total amount transferred: 54 ml Total supplement given: 0, did not have his kind of fomula available.  Total amount pumped post feed: 0   Ed Blalock RN, IBCLC  Addendeum 14:47 pm-Following appointment  mom was taken to clinic to check out and make follow up appt. LC left to take care of another patient. Mom did not check out, make follow up appt, or  get a print out of her AVS Summary. Copy of AVS printed and mailed to mom.                                                            Marisa Campbell  Marisa Campbell 12/04/2017, 8:46 AM

## 2018-11-17 ENCOUNTER — Other Ambulatory Visit (HOSPITAL_COMMUNITY): Payer: Self-pay | Admitting: Obstetrics and Gynecology

## 2018-11-17 LAB — OB RESULTS CONSOLE RUBELLA ANTIBODY, IGM: Rubella: IMMUNE

## 2018-11-17 LAB — OB RESULTS CONSOLE GC/CHLAMYDIA
Chlamydia: NEGATIVE
Gonorrhea: NEGATIVE

## 2018-11-17 LAB — OB RESULTS CONSOLE HIV ANTIBODY (ROUTINE TESTING): HIV: NONREACTIVE

## 2018-11-17 LAB — OB RESULTS CONSOLE HEPATITIS B SURFACE ANTIGEN: Hepatitis B Surface Ag: NEGATIVE

## 2018-11-17 LAB — OB RESULTS CONSOLE RPR: RPR: NONREACTIVE

## 2018-11-19 ENCOUNTER — Encounter (HOSPITAL_COMMUNITY): Payer: Self-pay | Admitting: *Deleted

## 2018-11-20 ENCOUNTER — Other Ambulatory Visit (HOSPITAL_COMMUNITY): Payer: Self-pay | Admitting: *Deleted

## 2018-11-20 ENCOUNTER — Other Ambulatory Visit: Payer: Self-pay

## 2018-11-20 ENCOUNTER — Ambulatory Visit (HOSPITAL_COMMUNITY): Payer: Medicaid Other | Attending: Obstetrics and Gynecology | Admitting: Obstetrics and Gynecology

## 2018-11-20 ENCOUNTER — Ambulatory Visit (HOSPITAL_COMMUNITY): Payer: Medicaid Other | Admitting: *Deleted

## 2018-11-20 ENCOUNTER — Encounter (HOSPITAL_COMMUNITY): Payer: Self-pay

## 2018-11-20 VITALS — BP 132/82 | HR 61 | Temp 98.2°F

## 2018-11-20 DIAGNOSIS — O09521 Supervision of elderly multigravida, first trimester: Secondary | ICD-10-CM | POA: Diagnosis not present

## 2018-11-20 DIAGNOSIS — O361110 Maternal care for Anti-A sensitization, first trimester, not applicable or unspecified: Secondary | ICD-10-CM

## 2018-11-20 DIAGNOSIS — O360910 Maternal care for other rhesus isoimmunization, first trimester, not applicable or unspecified: Secondary | ICD-10-CM

## 2018-11-20 DIAGNOSIS — Z3A08 8 weeks gestation of pregnancy: Secondary | ICD-10-CM | POA: Insufficient documentation

## 2018-11-20 DIAGNOSIS — O0990 Supervision of high risk pregnancy, unspecified, unspecified trimester: Secondary | ICD-10-CM

## 2018-11-20 DIAGNOSIS — O34219 Maternal care for unspecified type scar from previous cesarean delivery: Secondary | ICD-10-CM | POA: Diagnosis not present

## 2018-11-20 NOTE — Progress Notes (Signed)
Maternal-Fetal Medicine    Name: Marisa Campbell MRN: [id number] Requesting Provider: Waynard Reeds, MD  I had the pleasure of seeing [facility] at [facility]. She is G7 P4024 at 8w 4d gestation and is here for consultation because of increased anti-c (small) and anti-E (big) titers (1:32) on screening.  Obstetric history: 6. In May 2019, she delivered a female infant at term weighing 8-4. Precipitous delivery (from chart). The infant developed severe jaundice about 4 hours after birth and total bilirubin was greater than 20 mg/dL. He received IVIG treatment and exchange transfusion (once). The baby was discharged 3 days after delivery. Currently, he is in good health.  Patient had anti-c and anti-E antibody titers 1:4 in pregnancy and had MFM consultation. The titers have been stable and did not have serial middle-cerebral artery (MCA) Doppler surveillance.  5. 2017: Early SAB. 4. 2014: Early TAB without complications. 3. 2008: VBAC. Term delivery of a female infant weighing 8-10. No neonatal complications. 2. 2003: VBAC. Term delivery of a female infant weighing 9-9 at birth. Patient received blood transfusion following postpartum hemorrhage. No neonatal complications. 1. 2000: Term cesarean delivery (failure to progress in labor) of a female infant weighing 9-6 at birth.  Gyn history: History of abnormal Pap smears and cervical biopsy. No cervical surgeries including LEEP. No history of breast disease. She reports she had regular menstrual cycles.  PMH: Hypothyroidism. Patient takes nature thyroid. No history of hypertension or diabetes or any other chronic medical conditions. 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 No history of genital herpes infection. PSH: Cesarean section (2000). Allergies: NKDA. Medications: Prenatal vitamins, nature thyroid. Social: Denies tobacco or drug or alcohol use. Her husband is in good health. He is Hispanic and  is the father of her previous child (2019) and her pregnancy in 2017. All her previous children are from her previous partner.  Prenatal course: She had an early ultrasound and her EDD is established at 06/28/2019. Her blood pressure today at our office is 132/82 (132/88 mm Hg on 11/17/18).  I counseled the patient on the following: Rhesus Isoimmunization: -I explained rhesus antigens with help of diagrams.  -Patient does not have c and E antigen and she mounted antibody response to infused c and E antigens (blood transfusions and/or delivery of infant who has c and E antigens). -Explained isoimmunization in pregnancy, transplacental transfer of IgG antibodies and destruction of fetal RBC that can lead to fetal or neonatal hemolysis. -Her partner is more-likely to be c and E antigen positive. Partner's rhesus typing may be performed (zygosity). -Amniocentesis will determine fetal Rh status. I discussed amniocentesis procedure and its possible complications including miscarriage (1 in 500 procedures). Amniocentesis will also determine fetal karyotype (prenatal diagnosis). -I briefly discussed other options to screen for fetal Down syndrome (cell-free fetal DNA screening). -Fetal anemia can lead to fetal hydrops and stillbirth in severe cases. -If fetal Rh antigen status (c and E antigen) is unknown (i.e. the patient opts not to have amniocentesis), we will assume that the fetus is c and E antigen positive and would institute MCA Doppler surveillance. -Antenatal surveillance for detection of fetal anemia is recommended with a high titer of 1:32. Middle-cerebral artery Doppler is a noninvasive method of identifying fetal anemia and if the peak-systolic velocity is higher than the threshold (1.5 MoM), we would recommend fetal blood sampling.  -Briefly discussed fetal blood sampling and fetal blood transfusion that carries a fetal mortality rate of  about 1% to 2%.  -If pregnancy is under 20 weeks'  gestation and fetal anemia is detected, intraperitoneal transfusion may be tried. IVIG treatment has been given in some centers. -I recommend initiating MCA Doppler surveillance from 16 to 17 weeks' gestation and the frequency may be determined on the results obtained.  Timing of delivery: If there is no evidence of fetal anemia, the pregnancy can continue till 39 weeks. Elective induction at 39 weeks may be considered because of her history of precipitous labor.  Low-dose aspirin may be initiated if she has chronic hypertension. I briefly counseled her on the benefit of low-dose aspirin in delaying or preventing preeclampsia. If blood pressures are normal, aspirin is not indicated.  Previous cesarean section: I discussed the benefit and risks of VBAC. Having had 3 VBACs, the likelihood of successful VBAC is very high.  Recommendations: -Ultrasound at the [facility] in 8 weeks (16 weeks' gestation) for MCA Doppler study. -Amniocentesis at 16 weeks if the patient opts for the procedure. -Surveillance frequency will be determined on follow-up scans.  Thank you for your consult. Please do not hesitate to contact me if you have any questions or concerns.  Consultation including face-to-face counseling: 45 min.

## 2019-01-12 ENCOUNTER — Encounter (HOSPITAL_COMMUNITY): Payer: Self-pay

## 2019-01-13 ENCOUNTER — Other Ambulatory Visit: Payer: Self-pay

## 2019-01-13 ENCOUNTER — Encounter (HOSPITAL_COMMUNITY): Payer: Self-pay | Admitting: *Deleted

## 2019-01-13 ENCOUNTER — Other Ambulatory Visit (HOSPITAL_COMMUNITY): Payer: Self-pay | Admitting: Obstetrics and Gynecology

## 2019-01-13 ENCOUNTER — Ambulatory Visit (HOSPITAL_COMMUNITY): Payer: Medicaid Other | Admitting: *Deleted

## 2019-01-13 ENCOUNTER — Ambulatory Visit (HOSPITAL_COMMUNITY)
Admission: RE | Admit: 2019-01-13 | Discharge: 2019-01-13 | Disposition: A | Discharge status: Home / Self Care | Payer: Medicaid Other | Source: Ambulatory Visit | Attending: Obstetrics and Gynecology | Admitting: Obstetrics and Gynecology

## 2019-01-13 ENCOUNTER — Other Ambulatory Visit (HOSPITAL_COMMUNITY): Payer: Self-pay | Admitting: *Deleted

## 2019-01-13 VITALS — BP 130/72 | HR 59 | Temp 98.5°F

## 2019-01-13 DIAGNOSIS — O09522 Supervision of elderly multigravida, second trimester: Secondary | ICD-10-CM

## 2019-01-13 DIAGNOSIS — Z3A16 16 weeks gestation of pregnancy: Secondary | ICD-10-CM

## 2019-01-13 DIAGNOSIS — O99212 Obesity complicating pregnancy, second trimester: Secondary | ICD-10-CM

## 2019-01-13 DIAGNOSIS — O99282 Endocrine, nutritional and metabolic diseases complicating pregnancy, second trimester: Secondary | ICD-10-CM

## 2019-01-13 DIAGNOSIS — E039 Hypothyroidism, unspecified: Secondary | ICD-10-CM | POA: Diagnosis not present

## 2019-01-13 DIAGNOSIS — O34219 Maternal care for unspecified type scar from previous cesarean delivery: Secondary | ICD-10-CM

## 2019-01-13 DIAGNOSIS — O360910 Maternal care for other rhesus isoimmunization, first trimester, not applicable or unspecified: Secondary | ICD-10-CM | POA: Diagnosis present

## 2019-01-13 DIAGNOSIS — O09529 Supervision of elderly multigravida, unspecified trimester: Secondary | ICD-10-CM

## 2019-01-13 DIAGNOSIS — O360920 Maternal care for other rhesus isoimmunization, second trimester, not applicable or unspecified: Secondary | ICD-10-CM | POA: Diagnosis not present

## 2019-01-31 ENCOUNTER — Other Ambulatory Visit: Payer: Self-pay

## 2019-01-31 ENCOUNTER — Inpatient Hospital Stay (HOSPITAL_COMMUNITY)
Admission: AD | Admit: 2019-01-31 | Discharge: 2019-02-01 | Disposition: A | Discharge status: Home / Self Care | Payer: Medicaid Other | Attending: Obstetrics and Gynecology | Admitting: Obstetrics and Gynecology

## 2019-01-31 ENCOUNTER — Encounter (HOSPITAL_COMMUNITY): Payer: Self-pay

## 2019-01-31 DIAGNOSIS — Z7982 Long term (current) use of aspirin: Secondary | ICD-10-CM | POA: Diagnosis not present

## 2019-01-31 DIAGNOSIS — O26892 Other specified pregnancy related conditions, second trimester: Secondary | ICD-10-CM | POA: Diagnosis present

## 2019-01-31 DIAGNOSIS — U071 COVID-19: Secondary | ICD-10-CM

## 2019-01-31 DIAGNOSIS — O98512 Other viral diseases complicating pregnancy, second trimester: Secondary | ICD-10-CM | POA: Diagnosis not present

## 2019-01-31 DIAGNOSIS — Z3A19 19 weeks gestation of pregnancy: Secondary | ICD-10-CM | POA: Diagnosis not present

## 2019-01-31 HISTORY — DX: COVID-19: U07.1

## 2019-01-31 LAB — CBC WITH DIFFERENTIAL/PLATELET
Abs Immature Granulocytes: 0.04 10*3/uL (ref 0.00–0.07)
Basophils Absolute: 0 10*3/uL (ref 0.0–0.1)
Basophils Relative: 0 %
Eosinophils Absolute: 0 10*3/uL (ref 0.0–0.5)
Eosinophils Relative: 0 %
HCT: 34.2 % — ABNORMAL LOW (ref 36.0–46.0)
Hemoglobin: 11.9 g/dL — ABNORMAL LOW (ref 12.0–15.0)
Immature Granulocytes: 1 %
Lymphocytes Relative: 15 %
Lymphs Abs: 1 10*3/uL (ref 0.7–4.0)
MCH: 29.5 pg (ref 26.0–34.0)
MCHC: 34.8 g/dL (ref 30.0–36.0)
MCV: 84.9 fL (ref 80.0–100.0)
Monocytes Absolute: 0.4 10*3/uL (ref 0.1–1.0)
Monocytes Relative: 6 %
Neutro Abs: 4.8 10*3/uL (ref 1.7–7.7)
Neutrophils Relative %: 78 %
Platelets: 143 10*3/uL — ABNORMAL LOW (ref 150–400)
RBC: 4.03 MIL/uL (ref 3.87–5.11)
RDW: 14.5 % (ref 11.5–15.5)
WBC: 6.3 10*3/uL (ref 4.0–10.5)
nRBC: 0 % (ref 0.0–0.2)

## 2019-01-31 LAB — URINALYSIS, ROUTINE W REFLEX MICROSCOPIC
Bacteria, UA: NONE SEEN
Bilirubin Urine: NEGATIVE
Glucose, UA: NEGATIVE mg/dL
Ketones, ur: 5 mg/dL — AB
Leukocytes,Ua: NEGATIVE
Nitrite: NEGATIVE
Protein, ur: NEGATIVE mg/dL
Specific Gravity, Urine: 1.013 (ref 1.005–1.030)
pH: 5 (ref 5.0–8.0)

## 2019-01-31 MED ORDER — LACTATED RINGERS IV BOLUS
1000.0000 mL | Freq: Once | INTRAVENOUS | Status: AC
  Administered 2019-01-31: 1000 mL via INTRAVENOUS

## 2019-01-31 NOTE — MAU Provider Note (Addendum)
History     CSN: [id number]  Arrival date and time: 01/31/19 2108   First Provider Initiated Contact with Patient 01/31/19 2259      Chief Complaint  Patient presents with  . Fever    Pt took Tylenol 1000mg  around 5:00pm today.   . Diarrhea  . Flank Pain   HPI Marisa Campbell is a 38 y.o. [id number] at 7491w0d who presents with multiple complaints. She states she started having diarrhea on Friday 7/24. She states that has since improved but she is having pain in her back that she attributes to possibly being her kidneys. She reports a fever of 101.3 at home and took tylenol at 1700 with relief. She denies anyone in the house with similar symptoms. She denies any urinary symptoms. She also reports a lack of appetite because she cannot taste anything when she attempts to eat. She also reports being unable to smell anything. She denies any COVID contacts but states she travelled to the beach last week. She states she thought she lost her smell after a "sinus problem" last week where she had some congestion and sinus pressure. She denies any leaking or bleeding. Denies any abdominal pain.   OB History    Gravida  7   Para  4   Term  4   Preterm      AB  2   Living  4     SAB  1   TAB  1   Ectopic      Multiple      Live Births  4           Past Medical History:  Diagnosis Date  . Anemia   . Headache   . 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 Hypothyroidism    on meds currently  . Vaginal Pap smear, abnormal     Past Surgical History:  Procedure Laterality Date  . CESAREAN SECTION      Family History  Problem Relation Age of Onset  . Hypertension Father     Social History   Tobacco Use  . Smoking status: Never Smoker  . Smokeless tobacco: Never Used  Substance Use Topics  . Alcohol use: No    Frequency: Never  . Drug use: No    Allergies: No Known Allergies  Medications Prior to Admission  Medication Sig Dispense  Refill Last Dose  . aspirin EC 81 MG tablet Take 81 mg by mouth daily.     . butalbital-acetaminophen-caffeine (FIORICET) 50-325-40 MG tablet Take 1 tablet by mouth every 4 (four) hours as needed for headache.     . Thyroid (NATURE-THROID PO) Take by mouth.     . levothyroxine (SYNTHROID, LEVOTHROID) 175 MCG tablet Take 175 mcg by mouth daily before breakfast.     . polyethylene glycol (MIRALAX / GLYCOLAX) packet Take 17 g by mouth daily. (Patient not taking: Reported on 11/20/2018) 14 each 0   . Prenatal Vit w/Fe-Methylfol-FA (PNV PO) Take 1 tablet by mouth daily.        Review of Systems  Constitutional: Positive for appetite change, fatigue and fever.  HENT: Negative.   Respiratory: Negative.  Negative for shortness of breath.   Cardiovascular: Negative.  Negative for chest pain.  Gastrointestinal: Positive for diarrhea. Negative for abdominal pain, constipation, nausea and vomiting.  Genitourinary: Negative.  Negative for dysuria.  Musculoskeletal: Positive for back pain.  Neurological: Negative.  Negative for dizziness and headaches.  Physical Exam   Blood pressure 132/75, pulse 74, temperature 98.3 F (36.8 C), temperature source Oral, resp. rate 18, height 5' 2.5" (1.588 m), weight 119 kg, last menstrual period 09/21/2018, SpO2 99 %, unknown if currently breastfeeding.  Physical Exam  Nursing note and vitals reviewed. Constitutional: She is oriented to person, place, and time. She appears well-developed and well-nourished. No distress.  HENT:  Head: Normocephalic.  Eyes: Pupils are equal, round, and reactive to light.  Cardiovascular: Normal rate, regular rhythm and normal heart sounds.  Respiratory: Effort normal and breath sounds normal. No respiratory distress.  GI: Soft. Bowel sounds are normal. She exhibits no distension. There is no abdominal tenderness.  Neurological: She is alert and oriented to person, place, and time.  Skin: Skin is warm and dry.  Psychiatric: She  has a normal mood and affect. Her behavior is normal. Judgment and thought content normal.   FHT: 160 bpm  MAU Course  Procedures Results for orders placed or performed during the hospital encounter of 01/31/19 (from the past 24 hour(s))  Urinalysis, Routine w reflex microscopic     Status: Abnormal   Collection Time: 01/31/19 10:40 PM  Result Value Ref Range   Color, Urine YELLOW YELLOW   APPearance CLEAR CLEAR   Specific Gravity, Urine 1.013 1.005 - 1.030   pH 5.0 5.0 - 8.0   Glucose, UA NEGATIVE NEGATIVE mg/dL   Hgb urine dipstick SMALL (A) NEGATIVE   Bilirubin Urine NEGATIVE NEGATIVE   Ketones, ur 5 (A) NEGATIVE mg/dL   Protein, ur NEGATIVE NEGATIVE mg/dL   Nitrite NEGATIVE NEGATIVE   Leukocytes,Ua NEGATIVE NEGATIVE   RBC / HPF 0-5 0 - 5 RBC/hpf   WBC, UA 0-5 0 - 5 WBC/hpf   Bacteria, UA NONE SEEN NONE SEEN   Mucus PRESENT   CBC with Differential/Platelet     Status: Abnormal   Collection Time: 01/31/19 10:43 PM  Result Value Ref Range   WBC 6.3 4.0 - 10.5 K/uL   RBC 4.03 3.87 - 5.11 MIL/uL   Hemoglobin 11.9 (L) 12.0 - 15.0 g/dL   HCT 34.2 (L) 36.0 - 46.0 %   MCV 84.9 80.0 - 100.0 fL   MCH 29.5 26.0 - 34.0 pg   MCHC 34.8 30.0 - 36.0 g/dL   RDW 14.5 11.5 - 15.5 %   Platelets 143 (L) 150 - 400 K/uL   nRBC 0.0 0.0 - 0.2 %   Neutrophils Relative % 78 %   Neutro Abs 4.8 1.7 - 7.7 K/uL   Lymphocytes Relative 15 %   Lymphs Abs 1.0 0.7 - 4.0 K/uL   Monocytes Relative 6 %   Monocytes Absolute 0.4 0.1 - 1.0 K/uL   Eosinophils Relative 0 %   Eosinophils Absolute 0.0 0.0 - 0.5 K/uL   Basophils Relative 0 %   Basophils Absolute 0.0 0.0 - 0.1 K/uL   Immature Granulocytes 1 %   Abs Immature Granulocytes 0.04 0.00 - 0.07 K/uL  Comprehensive metabolic panel     Status: Abnormal   Collection Time: 01/31/19 10:43 PM  Result Value Ref Range   Sodium 134 (L) 135 - 145 mmol/L   Potassium 3.5 3.5 - 5.1 mmol/L   Chloride 105 98 - 111 mmol/L   CO2 22 22 - 32 mmol/L   Glucose, Bld  101 (H) 70 - 99 mg/dL   BUN 5 (L) 6 - 20 mg/dL   Creatinine, Ser 0.58 0.44 - 1.00 mg/dL   Calcium 8.3 (L) 8.9 - 10.3 mg/dL  Total Protein 6.5 6.5 - 8.1 g/dL   Albumin 3.0 (L) 3.5 - 5.0 g/dL   AST 17 15 - 41 U/L   ALT 16 0 - 44 U/L   Alkaline Phosphatase 57 38 - 126 U/L   Total Bilirubin 0.4 0.3 - 1.2 mg/dL   GFR calc non Af Amer >60 >60 mL/min   GFR calc Af Amer >60 >60 mL/min   Anion gap 7 5 - 15  SARS Coronavirus 2 (CEPHEID- [facility]formed [facility] lab), Hosp Order     Status: Abnormal   Collection Time: 01/31/19 11:22 PM   Specimen: Nasopharyngeal Swab  Result Value Ref Range   SARS Coronavirus 2 POSITIVE (A) NEGATIVE   MDM UA CBC with Diff CMP LR bolus  Patient afebrile and without respiratory complaints while in MAU. Due to GI upset and loss of taste and smell, will test for COVID-19.  Discussed with patient positive results of COVID testing. Lengthy discussion about importance of self quarantining for her and her family. Discussed warning signs of when to return and [facility] felt [facility] needed [facility]. Patient instructed to call office to cancel appointment in the office next week and reschedule for after quarantine period. Patient verbalized understanding of quarantine instructions.  [facility] informing of patient's appointment needing to be rescheduled   Assessment and Plan   1. COVID-19 virus infection   2. [redacted] weeks gestation of pregnancy    -Discharge home in stable condition -COVID-19 precautions discussed -Patient advised to [facility] to cancel appointments next week and reschedule for after quarantine. -Patient may return to MAU as needed or if her condition were to change or worsen   Rolm BookbinderCaroline M Angelmarie Ponzo CNM 01/31/2019, 10:59 PM

## 2019-01-31 NOTE — MAU Note (Signed)
Pt here with complaints of diarrhea since Friday, more controlled today. Pt reports today she has been feeling weak, dizzy and like her kidneys were hurting. Having flank pain. Pt reports she had a fever today of 101.3. Took Tylenol at 5pm and rechecked temp and it was 99.5. Pt denies urinary s/s.

## 2019-02-01 DIAGNOSIS — U071 COVID-19: Secondary | ICD-10-CM

## 2019-02-01 HISTORY — DX: COVID-19: U07.1

## 2019-02-01 LAB — SARS CORONAVIRUS 2 BY RT PCR (HOSPITAL ORDER, PERFORMED IN ~~LOC~~ HOSPITAL LAB): SARS Coronavirus 2: POSITIVE — AB

## 2019-02-01 LAB — COMPREHENSIVE METABOLIC PANEL
ALT: 16 U/L (ref 0–44)
AST: 17 U/L (ref 15–41)
Albumin: 3 g/dL — ABNORMAL LOW (ref 3.5–5.0)
Alkaline Phosphatase: 57 U/L (ref 38–126)
Anion gap: 7 (ref 5–15)
BUN: 5 mg/dL — ABNORMAL LOW (ref 6–20)
CO2: 22 mmol/L (ref 22–32)
Calcium: 8.3 mg/dL — ABNORMAL LOW (ref 8.9–10.3)
Chloride: 105 mmol/L (ref 98–111)
Creatinine, Ser: 0.58 mg/dL (ref 0.44–1.00)
GFR calc Af Amer: >60 mL/min (ref >60)
GFR calc non Af Amer: >60 mL/min (ref >60)
Glucose, Bld: 101 mg/dL — ABNORMAL HIGH (ref 70–99)
Potassium: 3.5 mmol/L (ref 3.5–5.1)
Sodium: 134 mmol/L — ABNORMAL LOW (ref 135–145)
Total Bilirubin: 0.4 mg/dL (ref 0.3–1.2)
Total Protein: 6.5 g/dL (ref 6.5–8.1)

## 2019-02-01 NOTE — Discharge Instructions (Signed)
COVID-19 °COVID-19 is a respiratory infection that is caused by a virus called severe acute respiratory syndrome coronavirus 2 (SARS-CoV-2). The disease is also known as coronavirus disease or novel coronavirus. In some people, the virus may not cause any symptoms. In others, it may cause a serious infection. The infection can get worse quickly and can lead to complications, such as: °· Pneumonia, or infection of the lungs. °· Acute respiratory distress syndrome or ARDS. This is fluid build-up in the lungs. °· Acute respiratory failure. This is a condition in which there is not enough oxygen passing from the lungs to the body. °· Sepsis or septic shock. This is a serious bodily reaction to an infection. °· Blood clotting problems. °· Secondary infections due to bacteria or fungus. °The virus that causes COVID-19 is contagious. This means that it can spread from person to person through droplets from coughs and sneezes (respiratory secretions). °What are the causes? °This illness is caused by a virus. You may catch the virus by: °· Breathing in droplets from an infected person's cough or sneeze. °· Touching something, like a table or a doorknob, that was exposed to the virus (contaminated) and then touching your mouth, nose, or eyes. °What increases the risk? °Risk for infection °You are more likely to be infected with this virus if you: °· Live in or travel to an area with a COVID-19 outbreak. °· Come in contact with a sick person who recently traveled to an area with a COVID-19 outbreak. °· Provide care for or live with a person who is infected with COVID-19. °Risk for serious illness °You are more likely to become seriously ill from the virus if you: °· Are 65 years of age or older. °· Have a long-term disease that lowers your body's ability to fight infection (immunocompromised). °· Live in a nursing home or long-term care facility. °· Have a long-term (chronic) disease such as: °? Chronic lung disease, including  chronic obstructive pulmonary disease or asthma °? Heart disease. °? Diabetes. °? Chronic kidney disease. °? Liver disease. °· Are obese. °What are the signs or symptoms? °Symptoms of this condition can range from mild to severe. Symptoms may appear any time from 2 to 14 days after being exposed to the virus. They include: °· A fever. °· A cough. °· Difficulty breathing. °· Chills. °· Muscle pains. °· A sore throat. °· Loss of taste or smell. °Some people may also have stomach problems, such as nausea, vomiting, or diarrhea. °Other people may not have any symptoms of COVID-19. °How is this diagnosed? °This condition may be diagnosed based on: °· Your signs and symptoms, especially if: °? You live in an area with a COVID-19 outbreak. °? You recently traveled to or from an area where the virus is common. °? You provide care for or live with a person who was diagnosed with COVID-19. °· A physical exam. °· Lab tests, which may include: °? A nasal swab to take a sample of fluid from your nose. °? A throat swab to take a sample of fluid from your throat. °? A sample of mucus from your lungs (sputum). °? Blood tests. °· Imaging tests, which may include, X-rays, CT scan, or ultrasound. °How is this treated? °At present, there is no medicine to treat COVID-19. Medicines that treat other diseases are being used on a trial basis to see if they are effective against COVID-19. °Your health care provider will talk with you about ways to treat your symptoms. For most   people, the infection is mild and can be managed at home with rest, fluids, and over-the-counter medicines. °Treatment for a serious infection usually takes places in a hospital intensive care unit (ICU). It may include one or more of the following treatments. These treatments are given until your symptoms improve. °· Receiving fluids and medicines through an IV. °· Supplemental oxygen. Extra oxygen is given through a tube in the nose, a face mask, or a  hood. °· Positioning you to lie on your stomach (prone position). This makes it easier for oxygen to get into the lungs. °· Continuous positive airway pressure (CPAP) or bi-level positive airway pressure (BPAP) machine. This treatment uses mild air pressure to keep the airways open. A tube that is connected to a motor delivers oxygen to the body. °· Ventilator. This treatment moves air into and out of the lungs by using a tube that is placed in your windpipe. °· Tracheostomy. This is a procedure to create a hole in the neck so that a breathing tube can be inserted. °· Extracorporeal membrane oxygenation (ECMO). This procedure gives the lungs a chance to recover by taking over the functions of the heart and lungs. It supplies oxygen to the body and removes carbon dioxide. °Follow these instructions at home: °Lifestyle °· If you are sick, stay home except to get medical care. Your health care provider will tell you how long to stay home. Call your health care provider before you go for medical care. °· Rest at home as told by your health care provider. °· Do not use any products that contain nicotine or tobacco, such as cigarettes, e-cigarettes, and chewing tobacco. If you need help quitting, ask your health care provider. °· Return to your normal activities as told by your health care provider. Ask your health care provider what activities are safe for you. °General instructions °· Take over-the-counter and prescription medicines only as told by your health care provider. °· Drink enough fluid to keep your urine pale yellow. °· Keep all follow-up visits as told by your health care provider. This is important. °How is this prevented? ° °There is no vaccine to help prevent COVID-19 infection. However, there are steps you can take to protect yourself and others from this virus. °To protect yourself:  °· Do not travel to areas where COVID-19 is a risk. The areas where COVID-19 is reported change often. To identify  high-risk areas and travel restrictions, check the CDC travel website: wwwnc.cdc.gov/travel/notices °· If you live in, or must travel to, an area where COVID-19 is a risk, take precautions to avoid infection. °? Stay away from people who are sick. °? Wash your hands often with soap and water for 20 seconds. If soap and water are not available, use an alcohol-based hand sanitizer. °? Avoid touching your mouth, face, eyes, or nose. °? Avoid going out in public, follow guidance from your state and local health authorities. °? If you must go out in public, wear a cloth face covering or face mask. °? Disinfect objects and surfaces that are frequently touched every day. This may include: °§ Counters and tables. °§ Doorknobs and light switches. °§ Sinks and faucets. °§ Electronics, such as phones, remote controls, keyboards, computers, and tablets. °To protect others: °If you have symptoms of COVID-19, take steps to prevent the virus from spreading to others. °· If you think you have a COVID-19 infection, contact your health care provider right away. Tell your health care team that you think you may   have a COVID-19 infection. °· Stay home. Leave your house only to seek medical care. Do not use public transport. °· Do not travel while you are sick. °· Wash your hands often with soap and water for 20 seconds. If soap and water are not available, use alcohol-based hand sanitizer. °· Stay away from other members of your household. Let healthy household members care for children and pets, if possible. If you have to care for children or pets, wash your hands often and wear a mask. If possible, stay in your own room, separate from others. Use a different bathroom. °· Make sure that all people in your household wash their hands well and often. °· Cough or sneeze into a tissue or your sleeve or elbow. Do not cough or sneeze into your hand or into the air. °· Wear a cloth face covering or face mask. °Where to find more  information °· Centers for Disease Control and Prevention: www.cdc.gov/coronavirus/2019-ncov/index.html °· World Health Organization: www.who.int/health-topics/coronavirus °Contact a health care provider if: °· You live in or have traveled to an area where COVID-19 is a risk and you have symptoms of the infection. °· You have had contact with someone who has COVID-19 and you have symptoms of the infection. °Get help right away if: °· You have trouble breathing. °· You have pain or pressure in your chest. °· You have confusion. °· You have bluish lips and fingernails. °· You have difficulty waking from sleep. °· You have symptoms that get worse. °These symptoms may represent a serious problem that is an emergency. Do not wait to see if the symptoms will go away. Get medical help right away. Call your local emergency services (911 in the [place] Do not drive yourself to the hospital. Let the emergency medical personnel know if you think you have COVID-19. °Summary °· COVID-19 is a respiratory infection that is caused by a virus. It is also known as coronavirus disease or novel coronavirus. It can cause serious infections, such as pneumonia, acute respiratory distress syndrome, acute respiratory failure, or sepsis. °· The virus that causes COVID-19 is contagious. This means that it can spread from person to person through droplets from coughs and sneezes. °· You are more likely to develop a serious illness if you are 65 years of age or older, have a weak immunity, live in a nursing home, or have chronic disease. °· There is no medicine to treat COVID-19. Your health care provider will talk with you about ways to treat your symptoms. °· Take steps to protect yourself and others from infection. Wash your hands often and disinfect objects and surfaces that are frequently touched every day. Stay away from people who are sick and wear a mask if you are sick. °This information is not intended to replace advice given to you by  your health care provider. Make sure you discuss any questions you have with your health care provider. °Document Released: 07/30/2018 Document Revised: 11/19/2018 Document Reviewed: 07/30/2018 °Elsevier Patient Education © 2020 [employer] ° °COVID-19: How to Protect Yourself and Others °Know how it spreads °· There is currently no vaccine to prevent coronavirus disease 2019 (COVID-19). °· The best way to prevent illness is to avoid being exposed to this virus. °· The virus is thought to spread mainly from person-to-person. °? Between people who are in close contact with one another (within about 6 feet). °? Through respiratory droplets produced when an infected person coughs, sneezes or talks. °? These droplets can land in   the mouths or noses of people who are nearby or possibly be inhaled into the lungs. °? Some recent studies have suggested that COVID-19 may be spread by people who are not showing symptoms. °Everyone should °Clean your hands often °· Wash your hands often with soap and water for at least 20 seconds especially after you have been in a public place, or after blowing your nose, coughing, or sneezing. °· If soap and water are not readily available, use a hand sanitizer that contains at least 60% alcohol. Cover all surfaces of your hands and rub them together until they feel dry. °· Avoid touching your eyes, nose, and mouth with unwashed hands. °Avoid close contact °· Stay home if you are sick. °· Avoid close contact with people who are sick. °· Put distance between yourself and other people. °? Remember that some people without symptoms may be able to spread virus. °? This is especially important for people who are at higher risk of getting very sick.www.cdc.gov/coronavirus/2019-ncov/need-extra-precautions/people-at-higher-risk.html °Cover your mouth and nose with a cloth face cover when around others °· You could spread COVID-19 to others even if you do not feel sick. °· Everyone should wear a  cloth face cover when they have to go out in public, for example to the grocery store or to pick up other necessities. °? Cloth face coverings should not be placed on young children under age 2, anyone who has trouble breathing, or is unconscious, incapacitated or otherwise unable to remove the mask without assistance. °· The cloth face cover is meant to protect other people in case you are infected. °· Do NOT use a facemask meant for a healthcare worker. °· Continue to keep about 6 feet between yourself and others. The cloth face cover is not a substitute for social distancing. °Cover coughs and sneezes °· If you are in a private setting and do not have on your cloth face covering, remember to always cover your mouth and nose with a tissue when you cough or sneeze or use the inside of your elbow. °· Throw used tissues in the trash. °· Immediately wash your hands with soap and water for at least 20 seconds. If soap and water are not readily available, clean your hands with a hand sanitizer that contains at least 60% alcohol. °Clean and disinfect °· Clean AND disinfect frequently touched surfaces daily. This includes tables, doorknobs, light switches, countertops, handles, desks, phones, keyboards, toilets, faucets, and sinks. www.cdc.gov/coronavirus/2019-ncov/prevent-getting-sick/disinfecting-your-home.html °· If surfaces are dirty, clean them: Use detergent or soap and water prior to disinfection. °· Then, use a household disinfectant. You can see a list of EPA-registered household disinfectants here. °cdc.gov/coronavirus °11/10/2018 °This information is not intended to replace advice given to you by your health care provider. Make sure you discuss any questions you have with your health care provider. °Document Released: 10/20/2018 Document Revised: 11/18/2018 Document Reviewed: 10/20/2018 °Elsevier Patient Education © 2020 [employer] ° ° °COVID-19 Frequently Asked Questions °COVID-19 (coronavirus disease) is  an infection that is caused by a large family of viruses. Some viruses cause illness in people and others cause illness in animals like camels, cats, and bats. In some cases, the viruses that cause illness in animals can spread to humans. °Where did the coronavirus come from? °In December 2019, [place] told the World Health Organization (WHO) of several cases of lung disease (human respiratory illness). These cases were linked to an open seafood and livestock market in the city of [place]. The link to the seafood and   livestock market suggests that the virus may have spread from animals to humans. However, since that first outbreak in December, the virus has also been shown to spread from person to person. °What is the name of the disease and the virus? °Disease name °Early on, this disease was called novel coronavirus. This is because scientists determined that the disease was caused by a new (novel) respiratory virus. The World Health Organization (WHO) has now named the disease COVID-19, or coronavirus disease. °Virus name °The virus that causes the disease is called severe acute respiratory syndrome coronavirus 2 (SARS-CoV-2). °More information on disease and virus naming °World Health Organization (WHO): www.who.int/emergencies/diseases/novel-coronavirus-2019/technical-guidance/naming-the-coronavirus-disease-(covid-2019)-and-the-virus-that-causes-it °Who is at risk for complications from coronavirus disease? °Some people may be at higher risk for complications from coronavirus disease. This includes older adults and people who have chronic diseases, such as heart disease, diabetes, and lung disease. °If you are at higher risk for complications, take these extra precautions: °· Avoid close contact with people who are sick or have a fever or cough. Stay at least 3-6 ft (1-2 m) away from them, if possible. °· Wash your hands often with soap and water for at least 20 seconds. °· Avoid touching your face, mouth, nose, or  eyes. °· Keep supplies on hand at home, such as food, medicine, and cleaning supplies. °· Stay home as much as possible. °· Avoid social gatherings and travel. °How does coronavirus disease spread? °The virus that causes coronavirus disease spreads easily from person to person (is contagious). There are also cases of community-spread disease. This means the disease has spread to: °· People who have no known contact with other infected people. °· People who have not traveled to areas where there are known cases. °It appears to spread from one person to another through droplets from coughing or sneezing. °Can I get the virus from touching surfaces or objects? °There is still a lot that we do not know about the virus that causes coronavirus disease. Scientists are basing a lot of information on what they know about similar viruses, such as: °· Viruses cannot generally survive on surfaces for long. They need a human body (host) to survive. °· It is more likely that the virus is spread by close contact with people who are sick (direct contact), such as through: °? Shaking hands or hugging. °? Breathing in respiratory droplets that travel through the air. This can happen when an infected person coughs or sneezes on or near other people. °· It is less likely that the virus is spread when a person touches a surface or object that has the virus on it (indirect contact). The virus may be able to enter the body if the person touches a surface or object and then touches his or her face, eyes, nose, or mouth. °Can a person spread the virus without having symptoms of the disease? °It may be possible for the virus to spread before a person has symptoms of the disease, but this is most likely not the main way the virus is spreading. It is more likely for the virus to spread by being in close contact with people who are sick and breathing in the respiratory droplets of a sick person's cough or sneeze. °What are the symptoms of  coronavirus disease? °Symptoms vary from person to person and can range from mild to severe. Symptoms may include: °· Fever. °· Cough. °· Tiredness, weakness, or fatigue. °· Fast breathing or feeling short of breath. °These symptoms can appear anywhere from   2 to 14 days after you have been exposed to the virus. If you develop symptoms, call your health care provider. People with severe symptoms may need hospital care. °If I am exposed to the virus, how long does it take before symptoms start? °Symptoms of coronavirus disease may appear anywhere from 2 to 14 days after a person has been exposed to the virus. If you develop symptoms, call your health care provider. °Should I be tested for this virus? °Your health care provider will decide whether to test you based on your symptoms, history of exposure, and your risk factors. °How does a health care provider test for this virus? °Health care providers will collect samples to send for testing. Samples may include: °· Taking a swab of fluid from the nose. °· Taking fluid from the lungs by having you cough up mucus (sputum) into a sterile cup. °· Taking a blood sample. °· Taking a stool or urine sample. °Is there a treatment or vaccine for this virus? °Currently, there is no vaccine to prevent coronavirus disease. Also, there are no medicines like antibiotics or antivirals to treat the virus. A person who becomes sick is given supportive care, which means rest and fluids. A person may also relieve his or her symptoms by using over-the-counter medicines that treat sneezing, coughing, and runny nose. These are the same medicines that a person takes for the common cold. °If you develop symptoms, call your health care provider. People with severe symptoms may need hospital care. °What can I do to protect myself and my family from this virus? ° °  ° °You can protect yourself and your family by taking the same actions that you would take to prevent the spread of other viruses.  Take the following actions: °· Wash your hands often with soap and water for at least 20 seconds. If soap and water are not available, use alcohol-based hand sanitizer. °· Avoid touching your face, mouth, nose, or eyes. °· Cough or sneeze into a tissue, sleeve, or elbow. Do not cough or sneeze into your hand or the air. °? If you cough or sneeze into a tissue, throw it away immediately and wash your hands. °· Disinfect objects and surfaces that you frequently touch every day. °· Avoid close contact with people who are sick or have a fever or cough. Stay at least 3-6 ft (1-2 m) away from them, if possible. °· Stay home if you are sick, except to get medical care. Call your health care provider before you get medical care. °· Make sure your vaccines are up to date. Ask your health care provider what vaccines you need. °What should I do if I need to travel? °Follow travel recommendations from your local health authority, the CDC, and WHO. °Travel information and advice °· Centers for Disease Control and Prevention (CDC): www.cdc.gov/coronavirus/2019-ncov/travelers/index.html °· World Health Organization (WHO): www.who.int/emergencies/diseases/novel-coronavirus-2019/travel-advice °Know the risks and take action to protect your health °· You are at higher risk of getting coronavirus disease if you are traveling to areas with an outbreak or if you are exposed to travelers from areas with an outbreak. °· Wash your hands often and practice good hygiene to lower the risk of catching or spreading the virus. °What should I do if I am sick? °General instructions to stop the spread of infection °· Wash your hands often with soap and water for at least 20 seconds. If soap and water are not available, use alcohol-based hand sanitizer. °· Cough or sneeze into a   tissue, sleeve, or elbow. Do not cough or sneeze into your hand or the air. °· If you cough or sneeze into a tissue, throw it away immediately and wash your hands. °· Stay  home unless you must get medical care. Call your health care provider or local health authority before you get medical care. °· Avoid public areas. Do not take public transportation, if possible. °· If you can, wear a mask if you must go out of the house or if you are in close contact with someone who is not sick. °Keep your home clean °· Disinfect objects and surfaces that are frequently touched every day. This may include: °? Counters and tables. °? Doorknobs and light switches. °? Sinks and faucets. °? Electronics such as phones, remote controls, keyboards, computers, and tablets. °· Wash dishes in hot, soapy water or use a dishwasher. Air-dry your dishes. °· Wash laundry in hot water. °Prevent infecting other household members °· Let healthy household members care for children and pets, if possible. If you have to care for children or pets, wash your hands often and wear a mask. °· Sleep in a different bedroom or bed, if possible. °· Do not share personal items, such as razors, toothbrushes, deodorant, combs, brushes, towels, and washcloths. °Where to find more information °Centers for Disease Control and Prevention (CDC) °· Information and news updates: www.cdc.gov/coronavirus/2019-ncov °World Health Organization (WHO) °· Information and news updates: www.who.int/emergencies/diseases/novel-coronavirus-2019 °· Coronavirus health topic: www.who.int/health-topics/coronavirus °· Questions and answers on COVID-19: www.who.int/news-room/q-a-detail/q-a-coronaviruses °· Global tracker: who.sprinklr.com °American Academy of Pediatrics (AAP) °· Information for families: www.healthychildren.org/English/health-issues/conditions/chest-lungs/Pages/2019-Novel-Coronavirus.aspx °The coronavirus situation is changing rapidly. Check your local health authority website or the CDC and WHO websites for updates and news. °When should I contact a health care provider? °· Contact your health care provider if you have symptoms of an  infection, such as fever or cough, and you: °? Have been near anyone who is known to have coronavirus disease. °? Have come into contact with a person who is suspected to have coronavirus disease. °? Have traveled outside of the country. °When should I get emergency medical care? °· Get help right away by calling your local emergency services (911 in the [place] if you have: °? Trouble breathing. °? Pain or pressure in your chest. °? Confusion. °? Blue-tinged lips and fingernails. °? Difficulty waking from sleep. °? Symptoms that get worse. °Let the emergency medical personnel know if you think you have coronavirus disease. °Summary °· A new respiratory virus is spreading from person to person and causing COVID-19 (coronavirus disease). °· The virus that causes COVID-19 appears to spread easily. It spreads from one person to another through droplets from coughing or sneezing. °· Older adults and those with chronic diseases are at higher risk of disease. If you are at higher risk for complications, take extra precautions. °· There is currently no vaccine to prevent coronavirus disease. There are no medicines, such as antibiotics or antivirals, to treat the virus. °· You can protect yourself and your family by washing your hands often, avoiding touching your face, and covering your coughs and sneezes. °This information is not intended to replace advice given to you by your health care provider. Make sure you discuss any questions you have with your health care provider. °Document Released: 10/20/2018 Document Revised: 10/20/2018 Document Reviewed: 10/20/2018 °Elsevier Patient Education © 2020 Elsevier Inc. ° °

## 2019-02-10 ENCOUNTER — Ambulatory Visit (HOSPITAL_COMMUNITY): Payer: Medicaid Other

## 2019-02-22 ENCOUNTER — Other Ambulatory Visit (HOSPITAL_COMMUNITY): Payer: Self-pay | Admitting: *Deleted

## 2019-02-22 ENCOUNTER — Encounter (HOSPITAL_COMMUNITY): Payer: Self-pay

## 2019-02-22 ENCOUNTER — Other Ambulatory Visit: Payer: Self-pay

## 2019-02-22 ENCOUNTER — Ambulatory Visit (HOSPITAL_COMMUNITY): Payer: Medicaid Other | Admitting: *Deleted

## 2019-02-22 ENCOUNTER — Ambulatory Visit (HOSPITAL_COMMUNITY)
Admission: RE | Admit: 2019-02-22 | Discharge: 2019-02-22 | Disposition: A | Discharge status: Home / Self Care | Payer: Medicaid Other | Source: Ambulatory Visit | Attending: Obstetrics and Gynecology | Admitting: Obstetrics and Gynecology

## 2019-02-22 VITALS — BP 121/62 | HR 54 | Temp 98.4°F

## 2019-02-22 DIAGNOSIS — E039 Hypothyroidism, unspecified: Secondary | ICD-10-CM

## 2019-02-22 DIAGNOSIS — O09512 Supervision of elderly primigravida, second trimester: Secondary | ICD-10-CM

## 2019-02-22 DIAGNOSIS — O99212 Obesity complicating pregnancy, second trimester: Secondary | ICD-10-CM

## 2019-02-22 DIAGNOSIS — O360920 Maternal care for other rhesus isoimmunization, second trimester, not applicable or unspecified: Secondary | ICD-10-CM | POA: Diagnosis not present

## 2019-02-22 DIAGNOSIS — O99282 Endocrine, nutritional and metabolic diseases complicating pregnancy, second trimester: Secondary | ICD-10-CM

## 2019-02-22 DIAGNOSIS — O361920 Maternal care for other isoimmunization, second trimester, not applicable or unspecified: Secondary | ICD-10-CM

## 2019-02-22 DIAGNOSIS — Z3A22 22 weeks gestation of pregnancy: Secondary | ICD-10-CM

## 2019-02-22 DIAGNOSIS — O09522 Supervision of elderly multigravida, second trimester: Secondary | ICD-10-CM | POA: Insufficient documentation

## 2019-02-22 DIAGNOSIS — O34219 Maternal care for unspecified type scar from previous cesarean delivery: Secondary | ICD-10-CM

## 2019-03-08 ENCOUNTER — Other Ambulatory Visit (HOSPITAL_COMMUNITY): Payer: Medicaid Other

## 2019-03-08 ENCOUNTER — Encounter (HOSPITAL_COMMUNITY): Payer: Self-pay | Admitting: *Deleted

## 2019-03-08 ENCOUNTER — Other Ambulatory Visit: Payer: Self-pay

## 2019-03-08 ENCOUNTER — Ambulatory Visit (HOSPITAL_COMMUNITY): Payer: Medicaid Other | Admitting: *Deleted

## 2019-03-08 ENCOUNTER — Ambulatory Visit (HOSPITAL_COMMUNITY)
Admission: RE | Admit: 2019-03-08 | Discharge: 2019-03-08 | Disposition: A | Discharge status: Home / Self Care | Payer: Medicaid Other | Source: Ambulatory Visit | Attending: Obstetrics and Gynecology | Admitting: Obstetrics and Gynecology

## 2019-03-08 VITALS — BP 126/65 | HR 54 | Temp 98.6°F

## 2019-03-08 DIAGNOSIS — O09529 Supervision of elderly multigravida, unspecified trimester: Secondary | ICD-10-CM

## 2019-03-08 DIAGNOSIS — O361920 Maternal care for other isoimmunization, second trimester, not applicable or unspecified: Secondary | ICD-10-CM | POA: Diagnosis present

## 2019-03-08 DIAGNOSIS — O99212 Obesity complicating pregnancy, second trimester: Secondary | ICD-10-CM | POA: Diagnosis not present

## 2019-03-08 DIAGNOSIS — O34219 Maternal care for unspecified type scar from previous cesarean delivery: Secondary | ICD-10-CM

## 2019-03-08 DIAGNOSIS — O360920 Maternal care for other rhesus isoimmunization, second trimester, not applicable or unspecified: Secondary | ICD-10-CM

## 2019-03-08 DIAGNOSIS — O09512 Supervision of elderly primigravida, second trimester: Secondary | ICD-10-CM | POA: Diagnosis not present

## 2019-03-08 DIAGNOSIS — Z3A24 24 weeks gestation of pregnancy: Secondary | ICD-10-CM

## 2019-03-22 ENCOUNTER — Other Ambulatory Visit (HOSPITAL_COMMUNITY): Payer: Self-pay | Admitting: *Deleted

## 2019-03-22 ENCOUNTER — Other Ambulatory Visit: Payer: Self-pay

## 2019-03-22 ENCOUNTER — Ambulatory Visit (HOSPITAL_COMMUNITY)
Admission: RE | Admit: 2019-03-22 | Discharge: 2019-03-22 | Disposition: A | Discharge status: Home / Self Care | Payer: Medicaid Other | Source: Ambulatory Visit | Attending: Obstetrics and Gynecology | Admitting: Obstetrics and Gynecology

## 2019-03-22 ENCOUNTER — Ambulatory Visit (HOSPITAL_COMMUNITY): Payer: Medicaid Other | Admitting: *Deleted

## 2019-03-22 ENCOUNTER — Encounter (HOSPITAL_COMMUNITY): Payer: Self-pay

## 2019-03-22 VITALS — BP 118/63 | HR 64 | Temp 98.2°F

## 2019-03-22 DIAGNOSIS — O99282 Endocrine, nutritional and metabolic diseases complicating pregnancy, second trimester: Secondary | ICD-10-CM | POA: Diagnosis not present

## 2019-03-22 DIAGNOSIS — Z3A26 26 weeks gestation of pregnancy: Secondary | ICD-10-CM

## 2019-03-22 DIAGNOSIS — O09512 Supervision of elderly primigravida, second trimester: Secondary | ICD-10-CM | POA: Diagnosis not present

## 2019-03-22 DIAGNOSIS — O99212 Obesity complicating pregnancy, second trimester: Secondary | ICD-10-CM

## 2019-03-22 DIAGNOSIS — O361920 Maternal care for other isoimmunization, second trimester, not applicable or unspecified: Secondary | ICD-10-CM | POA: Insufficient documentation

## 2019-03-22 DIAGNOSIS — O360920 Maternal care for other rhesus isoimmunization, second trimester, not applicable or unspecified: Secondary | ICD-10-CM | POA: Diagnosis not present

## 2019-03-22 DIAGNOSIS — E039 Hypothyroidism, unspecified: Secondary | ICD-10-CM

## 2019-03-22 DIAGNOSIS — Z362 Encounter for other antenatal screening follow-up: Secondary | ICD-10-CM | POA: Diagnosis not present

## 2019-03-22 DIAGNOSIS — O09523 Supervision of elderly multigravida, third trimester: Secondary | ICD-10-CM

## 2019-03-22 DIAGNOSIS — O34219 Maternal care for unspecified type scar from previous cesarean delivery: Secondary | ICD-10-CM

## 2019-03-22 DIAGNOSIS — O361931 Maternal care for other isoimmunization, third trimester, fetus 1: Secondary | ICD-10-CM

## 2019-03-22 NOTE — Progress Notes (Signed)
Pt reports having a gush of clear fluid during intercourse about a 1.5 weeks ago. Has been wearing a pad due to feeling wet.  She notified her OB office and has an appt later this week.

## 2019-04-05 ENCOUNTER — Ambulatory Visit (HOSPITAL_COMMUNITY): Payer: Medicaid Other | Admitting: *Deleted

## 2019-04-05 ENCOUNTER — Encounter (HOSPITAL_COMMUNITY): Payer: Self-pay

## 2019-04-05 ENCOUNTER — Other Ambulatory Visit: Payer: Self-pay

## 2019-04-05 ENCOUNTER — Ambulatory Visit (HOSPITAL_COMMUNITY)
Admission: RE | Admit: 2019-04-05 | Discharge: 2019-04-05 | Disposition: A | Discharge status: Home / Self Care | Payer: Medicaid Other | Source: Ambulatory Visit | Attending: Obstetrics and Gynecology | Admitting: Obstetrics and Gynecology

## 2019-04-05 ENCOUNTER — Other Ambulatory Visit (HOSPITAL_COMMUNITY): Payer: Self-pay | Admitting: Obstetrics and Gynecology

## 2019-04-05 VITALS — BP 128/60 | HR 67 | Temp 98.4°F

## 2019-04-05 DIAGNOSIS — O360930 Maternal care for other rhesus isoimmunization, third trimester, not applicable or unspecified: Secondary | ICD-10-CM

## 2019-04-05 DIAGNOSIS — O34219 Maternal care for unspecified type scar from previous cesarean delivery: Secondary | ICD-10-CM

## 2019-04-05 DIAGNOSIS — Z3A28 28 weeks gestation of pregnancy: Secondary | ICD-10-CM

## 2019-04-05 DIAGNOSIS — O99283 Endocrine, nutritional and metabolic diseases complicating pregnancy, third trimester: Secondary | ICD-10-CM | POA: Diagnosis not present

## 2019-04-05 DIAGNOSIS — O0990 Supervision of high risk pregnancy, unspecified, unspecified trimester: Secondary | ICD-10-CM

## 2019-04-05 DIAGNOSIS — O09523 Supervision of elderly multigravida, third trimester: Secondary | ICD-10-CM

## 2019-04-05 DIAGNOSIS — O99213 Obesity complicating pregnancy, third trimester: Secondary | ICD-10-CM

## 2019-04-05 DIAGNOSIS — O361931 Maternal care for other isoimmunization, third trimester, fetus 1: Secondary | ICD-10-CM | POA: Insufficient documentation

## 2019-04-05 DIAGNOSIS — E039 Hypothyroidism, unspecified: Secondary | ICD-10-CM

## 2019-04-19 ENCOUNTER — Other Ambulatory Visit: Payer: Self-pay

## 2019-04-19 ENCOUNTER — Ambulatory Visit (HOSPITAL_COMMUNITY): Payer: Medicaid Other | Admitting: *Deleted

## 2019-04-19 ENCOUNTER — Encounter (HOSPITAL_COMMUNITY): Payer: Self-pay | Admitting: *Deleted

## 2019-04-19 ENCOUNTER — Ambulatory Visit (HOSPITAL_COMMUNITY)
Admission: RE | Admit: 2019-04-19 | Discharge: 2019-04-19 | Disposition: A | Discharge status: Home / Self Care | Payer: Medicaid Other | Source: Ambulatory Visit | Attending: Obstetrics and Gynecology | Admitting: Obstetrics and Gynecology

## 2019-04-19 ENCOUNTER — Other Ambulatory Visit (HOSPITAL_COMMUNITY): Payer: Self-pay | Admitting: *Deleted

## 2019-04-19 VITALS — BP 125/60 | HR 52 | Temp 97.8°F

## 2019-04-19 DIAGNOSIS — O360930 Maternal care for other rhesus isoimmunization, third trimester, not applicable or unspecified: Secondary | ICD-10-CM

## 2019-04-19 DIAGNOSIS — O09529 Supervision of elderly multigravida, unspecified trimester: Secondary | ICD-10-CM | POA: Diagnosis present

## 2019-04-19 DIAGNOSIS — Z362 Encounter for other antenatal screening follow-up: Secondary | ICD-10-CM | POA: Diagnosis not present

## 2019-04-19 DIAGNOSIS — O99213 Obesity complicating pregnancy, third trimester: Secondary | ICD-10-CM | POA: Diagnosis not present

## 2019-04-19 DIAGNOSIS — O09523 Supervision of elderly multigravida, third trimester: Secondary | ICD-10-CM | POA: Diagnosis not present

## 2019-04-19 DIAGNOSIS — E039 Hypothyroidism, unspecified: Secondary | ICD-10-CM

## 2019-04-19 DIAGNOSIS — O99283 Endocrine, nutritional and metabolic diseases complicating pregnancy, third trimester: Secondary | ICD-10-CM

## 2019-04-19 DIAGNOSIS — O361931 Maternal care for other isoimmunization, third trimester, fetus 1: Secondary | ICD-10-CM | POA: Insufficient documentation

## 2019-04-19 DIAGNOSIS — O34219 Maternal care for unspecified type scar from previous cesarean delivery: Secondary | ICD-10-CM

## 2019-04-19 DIAGNOSIS — Z3A30 30 weeks gestation of pregnancy: Secondary | ICD-10-CM

## 2019-05-03 ENCOUNTER — Encounter (HOSPITAL_COMMUNITY): Payer: Self-pay

## 2019-05-03 ENCOUNTER — Other Ambulatory Visit (HOSPITAL_COMMUNITY): Payer: Self-pay | Admitting: *Deleted

## 2019-05-03 ENCOUNTER — Ambulatory Visit (HOSPITAL_COMMUNITY): Payer: Medicaid Other | Admitting: *Deleted

## 2019-05-03 ENCOUNTER — Other Ambulatory Visit: Payer: Self-pay

## 2019-05-03 ENCOUNTER — Ambulatory Visit (HOSPITAL_COMMUNITY): Payer: Medicaid Other

## 2019-05-03 ENCOUNTER — Ambulatory Visit (HOSPITAL_COMMUNITY)
Admission: RE | Admit: 2019-05-03 | Discharge: 2019-05-03 | Disposition: A | Discharge status: Home / Self Care | Payer: Medicaid Other | Source: Ambulatory Visit | Attending: Obstetrics | Admitting: Obstetrics

## 2019-05-03 ENCOUNTER — Other Ambulatory Visit (HOSPITAL_COMMUNITY): Payer: Self-pay | Admitting: Obstetrics

## 2019-05-03 VITALS — BP 121/73 | HR 61 | Temp 98.4°F

## 2019-05-03 DIAGNOSIS — O09529 Supervision of elderly multigravida, unspecified trimester: Secondary | ICD-10-CM | POA: Diagnosis present

## 2019-05-03 DIAGNOSIS — O09523 Supervision of elderly multigravida, third trimester: Secondary | ICD-10-CM

## 2019-05-03 DIAGNOSIS — O99283 Endocrine, nutritional and metabolic diseases complicating pregnancy, third trimester: Secondary | ICD-10-CM

## 2019-05-03 DIAGNOSIS — O34219 Maternal care for unspecified type scar from previous cesarean delivery: Secondary | ICD-10-CM

## 2019-05-03 DIAGNOSIS — E039 Hypothyroidism, unspecified: Secondary | ICD-10-CM

## 2019-05-03 DIAGNOSIS — O360930 Maternal care for other rhesus isoimmunization, third trimester, not applicable or unspecified: Secondary | ICD-10-CM

## 2019-05-03 DIAGNOSIS — Z3A32 32 weeks gestation of pregnancy: Secondary | ICD-10-CM

## 2019-05-03 DIAGNOSIS — O99213 Obesity complicating pregnancy, third trimester: Secondary | ICD-10-CM | POA: Diagnosis not present

## 2019-05-03 DIAGNOSIS — O0990 Supervision of high risk pregnancy, unspecified, unspecified trimester: Secondary | ICD-10-CM

## 2019-05-10 ENCOUNTER — Encounter (HOSPITAL_COMMUNITY): Payer: Self-pay | Admitting: *Deleted

## 2019-05-10 ENCOUNTER — Ambulatory Visit (HOSPITAL_COMMUNITY): Payer: Medicaid Other | Admitting: *Deleted

## 2019-05-10 ENCOUNTER — Other Ambulatory Visit: Payer: Self-pay

## 2019-05-10 ENCOUNTER — Ambulatory Visit (HOSPITAL_COMMUNITY)
Admission: RE | Admit: 2019-05-10 | Discharge: 2019-05-10 | Disposition: A | Discharge status: Home / Self Care | Payer: Medicaid Other | Source: Ambulatory Visit | Attending: Obstetrics and Gynecology | Admitting: Obstetrics and Gynecology

## 2019-05-10 VITALS — BP 117/63 | HR 60 | Temp 98.5°F

## 2019-05-10 DIAGNOSIS — E039 Hypothyroidism, unspecified: Secondary | ICD-10-CM | POA: Insufficient documentation

## 2019-05-10 DIAGNOSIS — O360930 Maternal care for other rhesus isoimmunization, third trimester, not applicable or unspecified: Secondary | ICD-10-CM | POA: Diagnosis not present

## 2019-05-10 DIAGNOSIS — O09523 Supervision of elderly multigravida, third trimester: Secondary | ICD-10-CM | POA: Insufficient documentation

## 2019-05-10 DIAGNOSIS — O99213 Obesity complicating pregnancy, third trimester: Secondary | ICD-10-CM

## 2019-05-10 DIAGNOSIS — O34219 Maternal care for unspecified type scar from previous cesarean delivery: Secondary | ICD-10-CM

## 2019-05-10 DIAGNOSIS — O99283 Endocrine, nutritional and metabolic diseases complicating pregnancy, third trimester: Secondary | ICD-10-CM

## 2019-05-10 DIAGNOSIS — Z3A33 33 weeks gestation of pregnancy: Secondary | ICD-10-CM

## 2019-05-13 ENCOUNTER — Other Ambulatory Visit: Payer: Self-pay

## 2019-05-13 ENCOUNTER — Inpatient Hospital Stay (HOSPITAL_COMMUNITY)
Admission: AD | Admit: 2019-05-13 | Discharge: 2019-05-17 | DRG: 832 | Disposition: A | Discharge status: Home / Self Care | Payer: Medicaid Other | Attending: Obstetrics and Gynecology | Admitting: Obstetrics and Gynecology

## 2019-05-13 DIAGNOSIS — N939 Abnormal uterine and vaginal bleeding, unspecified: Secondary | ICD-10-CM

## 2019-05-13 DIAGNOSIS — Z20828 Contact with and (suspected) exposure to other viral communicable diseases: Secondary | ICD-10-CM | POA: Diagnosis present

## 2019-05-13 DIAGNOSIS — O99283 Endocrine, nutritional and metabolic diseases complicating pregnancy, third trimester: Secondary | ICD-10-CM | POA: Diagnosis present

## 2019-05-13 DIAGNOSIS — Z3A33 33 weeks gestation of pregnancy: Secondary | ICD-10-CM

## 2019-05-13 DIAGNOSIS — R1084 Generalized abdominal pain: Secondary | ICD-10-CM | POA: Diagnosis present

## 2019-05-13 DIAGNOSIS — O360930 Maternal care for other rhesus isoimmunization, third trimester, not applicable or unspecified: Secondary | ICD-10-CM | POA: Diagnosis present

## 2019-05-13 DIAGNOSIS — O4693 Antepartum hemorrhage, unspecified, third trimester: Secondary | ICD-10-CM

## 2019-05-13 DIAGNOSIS — O34219 Maternal care for unspecified type scar from previous cesarean delivery: Secondary | ICD-10-CM | POA: Diagnosis present

## 2019-05-14 ENCOUNTER — Encounter (HOSPITAL_COMMUNITY): Payer: Self-pay

## 2019-05-14 ENCOUNTER — Other Ambulatory Visit: Payer: Self-pay

## 2019-05-14 ENCOUNTER — Inpatient Hospital Stay (HOSPITAL_COMMUNITY): Payer: Medicaid Other

## 2019-05-14 DIAGNOSIS — O99213 Obesity complicating pregnancy, third trimester: Secondary | ICD-10-CM

## 2019-05-14 DIAGNOSIS — Z362 Encounter for other antenatal screening follow-up: Secondary | ICD-10-CM | POA: Diagnosis not present

## 2019-05-14 DIAGNOSIS — O361130 Maternal care for Anti-A sensitization, third trimester, not applicable or unspecified: Secondary | ICD-10-CM

## 2019-05-14 DIAGNOSIS — O360930 Maternal care for other rhesus isoimmunization, third trimester, not applicable or unspecified: Secondary | ICD-10-CM | POA: Diagnosis present

## 2019-05-14 DIAGNOSIS — O4693 Antepartum hemorrhage, unspecified, third trimester: Secondary | ICD-10-CM | POA: Diagnosis present

## 2019-05-14 DIAGNOSIS — O34219 Maternal care for unspecified type scar from previous cesarean delivery: Secondary | ICD-10-CM | POA: Diagnosis present

## 2019-05-14 DIAGNOSIS — Z3A33 33 weeks gestation of pregnancy: Secondary | ICD-10-CM

## 2019-05-14 DIAGNOSIS — O99283 Endocrine, nutritional and metabolic diseases complicating pregnancy, third trimester: Secondary | ICD-10-CM | POA: Diagnosis present

## 2019-05-14 DIAGNOSIS — O09523 Supervision of elderly multigravida, third trimester: Secondary | ICD-10-CM | POA: Diagnosis not present

## 2019-05-14 DIAGNOSIS — E039 Hypothyroidism, unspecified: Secondary | ICD-10-CM

## 2019-05-14 DIAGNOSIS — Z20828 Contact with and (suspected) exposure to other viral communicable diseases: Secondary | ICD-10-CM | POA: Diagnosis present

## 2019-05-14 DIAGNOSIS — R1084 Generalized abdominal pain: Secondary | ICD-10-CM | POA: Diagnosis present

## 2019-05-14 LAB — CBC
HCT: 35.1 % — ABNORMAL LOW (ref 36.0–46.0)
Hemoglobin: 12.6 g/dL (ref 12.0–15.0)
MCH: 31.5 pg (ref 26.0–34.0)
MCHC: 35.9 g/dL (ref 30.0–36.0)
MCV: 87.8 fL (ref 80.0–100.0)
Platelets: 194 10*3/uL (ref 150–400)
RBC: 4 MIL/uL (ref 3.87–5.11)
RDW: 13.5 % (ref 11.5–15.5)
WBC: 10.1 10*3/uL (ref 4.0–10.5)
nRBC: 0 % (ref 0.0–0.2)

## 2019-05-14 LAB — TSH: TSH: 2.405 u[IU]/mL (ref 0.350–4.500)

## 2019-05-14 LAB — T4, FREE: Free T4: 0.67 ng/dL (ref 0.61–1.12)

## 2019-05-14 LAB — SARS CORONAVIRUS 2 (TAT 6-24 HRS): SARS Coronavirus 2: NEGATIVE

## 2019-05-14 MED ORDER — LEVOTHYROXINE SODIUM 25 MCG PO TABS
125.0000 ug | ORAL_TABLET | Freq: Every day | ORAL | Status: DC
  Administered 2019-05-15 – 2019-05-17 (×3): 125 ug via ORAL
  Filled 2019-05-14 (×3): qty 5

## 2019-05-14 MED ORDER — BETAMETHASONE SOD PHOS & ACET 6 (3-3) MG/ML IJ SUSP
12.0000 mg | INTRAMUSCULAR | Status: AC
  Administered 2019-05-14 – 2019-05-15 (×2): 12 mg via INTRAMUSCULAR
  Filled 2019-05-14: qty 5

## 2019-05-14 MED ORDER — LACTATED RINGERS IV SOLN
INTRAVENOUS | Status: DC
  Administered 2019-05-14: 125 mL/h via INTRAVENOUS

## 2019-05-14 MED ORDER — LACTATED RINGERS IV SOLN: INTRAVENOUS | Status: DC

## 2019-05-14 MED ORDER — PRENATAL MULTIVITAMIN CH
1.0000 | ORAL_TABLET | Freq: Every day | ORAL | Status: DC
  Administered 2019-05-14 – 2019-05-17 (×4): 1 via ORAL
  Filled 2019-05-14 (×4): qty 1

## 2019-05-14 MED ORDER — DOCUSATE SODIUM 100 MG PO CAPS
100.0000 mg | ORAL_CAPSULE | Freq: Every day | ORAL | Status: DC
  Administered 2019-05-14 – 2019-05-17 (×4): 100 mg via ORAL
  Filled 2019-05-14 (×4): qty 1

## 2019-05-14 MED ORDER — ACETAMINOPHEN 325 MG PO TABS
650.0000 mg | ORAL_TABLET | ORAL | Status: DC | PRN
  Administered 2019-05-14: 650 mg via ORAL
  Filled 2019-05-14: qty 2

## 2019-05-14 MED ORDER — CALCIUM CARBONATE ANTACID 500 MG PO CHEW
2.0000 | CHEWABLE_TABLET | ORAL | Status: DC | PRN
  Administered 2019-05-14 – 2019-05-15 (×6): 400 mg via ORAL
  Filled 2019-05-14 (×8): qty 2

## 2019-05-14 MED ORDER — LEVOTHYROXINE SODIUM 25 MCG PO TABS: 175.0000 ug | ORAL_TABLET | Freq: Every day | ORAL | Status: DC

## 2019-05-14 MED ORDER — ZOLPIDEM TARTRATE 5 MG PO TABS: 5.0000 mg | ORAL_TABLET | Freq: Every evening | ORAL | Status: DC | PRN

## 2019-05-14 NOTE — MAU Provider Note (Signed)
Chief Complaint:  Vaginal Bleeding and Abdominal Pain   First Provider Initiated Contact with Patient 05/14/19 0043     HPI: Marisa Campbell is a 38 y.o. [id number] at 43w4dwho presents to maternity admissions reporting generalized abdominal pain and vaginal bleeding tonight.  Abdominal pain started yesterday during the day. . She reports good fetal movement, denies LOF, vaginal bleeding, vaginal itching/burning, urinary symptoms, h/a, dizziness, n/v, diarrhea, constipation or fever/chills.  She denies headache, visual changes or RUQ abdominal pain.  Vaginal Bleeding The patient's primary symptoms include vaginal bleeding. The patient's pertinent negatives include no genital itching, genital lesions or genital odor. This is a new problem. The current episode started today. The problem occurs constantly. The problem has been unchanged. The pain is moderate. The problem affects both sides. She is pregnant. Associated symptoms include abdominal pain, back pain and nausea. Pertinent negatives include no constipation, diarrhea, fever, frequency, headaches or vomiting. The vaginal discharge was bloody. The vaginal bleeding is heavier than menses. She has been passing clots. She has not been passing tissue. Nothing aggravates the symptoms. She has tried nothing for the symptoms.  Abdominal Pain This is a new problem. The current episode started today. The problem occurs constantly. The problem has been unchanged. The pain is located in the generalized abdominal region. The quality of the pain is cramping (sore). The abdominal pain does not radiate. Associated symptoms include nausea. Pertinent negatives include no constipation, diarrhea, fever, frequency, headaches or vomiting. The pain is aggravated by palpation and movement. The pain is relieved by nothing. She has tried nothing for the symptoms.   Patient Active Problem List   Diagnosis Date Noted  . Vaginal bleeding in pregnancy, third trimester  05/14/2019  . COVID-19 virus infection 02/01/2019  . 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  Lower back started hurting too and felt nauseated.  Then rested all today but pain continued.  Started having vaginal bleeding at 11:30 pm, underwear soaked and toilet water was bright red, had a clot - golf ball size- then went to hospital.  Last sex was a week ago. Baby moving a lot.   Past Medical History: Past Medical History:  Diagnosis Date  . Anemia   . COVID-19 01/31/2019  . Headache   . 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 Hypothyroidism    on meds currently  . Vaginal Pap smear, abnormal     Past obstetric history: OB History  Gravida Para Term Preterm AB Living  7 4 4   2 4   SAB TAB Ectopic Multiple Live Births  1 1     4     # Outcome Date GA Lbr Len/2nd Weight Sex Delivery Anes PTL Lv  7 Current           6 Term 11/08/17 [redacted]w[redacted]d       LIV  5 SAB 2017          4 TAB 2014          3 Term 2008 [redacted]w[redacted]d  4082 g M Vag-Spont   LIV  2 Term 2003 [redacted]w[redacted]d  4338 g M Vag-Spont   LIV  1 Term 2000 [redacted]w[redacted]d  4252 g M CS-LTranv   LIV     Complications: Failure to progress in labor    Past Surgical  History: Past Surgical History:  Procedure Laterality Date  . CESAREAN SECTION      Family History: Family History  Problem Relation Age of Onset  . Hypertension Father     Social History: Social History   Tobacco Use  . Smoking status: Never Smoker  . Smokeless tobacco: Never Used  Substance Use Topics  . Alcohol use: No    Frequency: Never  . Drug use: No    Allergies: No Known Allergies  Meds:  Medications Prior to Admission  Medication Sig Dispense Refill Last Dose  . aspirin EC 81 MG tablet Take 81 mg by mouth daily.   05/13/2019 at Unknown time  .  levothyroxine (SYNTHROID, LEVOTHROID) 175 MCG tablet Take 175 mcg by mouth daily before breakfast.   05/13/2019 at Unknown time  . Prenatal Vit w/Fe-Methylfol-FA (PNV PO) Take 1 tablet by mouth daily.    05/13/2019 at Unknown time  . butalbital-acetaminophen-caffeine (FIORICET) 50-325-40 MG tablet Take 1 tablet by mouth every 4 (four) hours as needed for headache.   More than a month at Unknown time    I have reviewed patient's Past Medical Hx, Surgical Hx, Family Hx, Social Hx, medications and allergies.   ROS:  Review of Systems  Constitutional: Negative for fever.  Gastrointestinal: Positive for abdominal pain and nausea. Negative for constipation, diarrhea and vomiting.  Genitourinary: Positive for vaginal bleeding. Negative for frequency.  Musculoskeletal: Positive for back pain.  Neurological: Negative for headaches.   Other systems negative  Physical Exam  No data found. Constitutional: Well-developed, well-nourished female in no acute distress.  Cardiovascular: normal rate and rhythm Respiratory: normal effort, clear to auscultation bilaterally GI: Abd soft, non-tender, gravid appropriate for gestational age.   No rebound or guarding. MS: Extremities nontender, no edema, normal ROM Neurologic: Alert and oriented x 4.  GU: Neg CVAT.  PELVIC EXAM: Cervix pink, visually closed, without lesion, small amount of bloody discharge, vaginal walls and external genitalia normal     Dilation: 1 Effacement (%): 30, 40 Cervical Position: Posterior Station: Ballotable Exam by:: [place] CNM   FHT:  Baseline 140 , moderate variability, accelerations present, no decelerations Contractions:  Irregular    Labs: Pending CBC and T/S   Imaging:  Pending  MAU Course/MDM: I have ordered routine labs and antenatal orders   NST reviewed, reactive with irregular mild contractions Consult Dr Elly Modena with presentation, exam findings and test results. She recommends admission Dr Ouida Sills  consulted with same.  Will get limited US to evaluate placenta    Just had an [place] for growth a few days ago  Assessment: Single IUP at [redacted]w[redacted]d Third Trimester Bleeding Diffuse abdominal pain Irregular mild contractions  Plan: Admit to Antenatal  Routine orders Patient wants to go home to be with 42month old child.  Encouraged to stay overnight and discuss with MD in the am   Hansel Feinstein CNM, MSN Certified Nurse-Midwife 05/14/2019 12:43 AM

## 2019-05-14 NOTE — MAU Note (Signed)
Abdominal soreness since yesterday, hurts to bend down, like muscles are sore but got stronger and contractions started last night.  Lower back started hurting too and felt nauseated.  Then rested all today but pain continued.  Started having vaginal bleeding at 11:30 pm, underwear soaked and toilet water was bright red, had a clot - golf ball size- then went to hospital.  Last sex was a week ago. Baby moving a lot.

## 2019-05-14 NOTE — Consult Note (Signed)
MFM Consult  This patient was seen in consultation at the request of Dr. Philis Pique due to third trimester vaginal bleeding.  The patient is a gravida 7 para 4-0-2-4 currently at 33 weeks and 4 days.  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  She has been followed in our office with serial ultrasounds due to isoimmunization.  Her middle cerebral artery Doppler studies have not indicated that her fetus is anemic.  The patient reports that about two nights ago, she started to feel contractions and some abdominal pain.  She then stayed in bed all day yesterday.  Last evening after she took a shower and used the bathroom, she had bright red vaginal bleeding and passed a golf ball sized blood clot.  She then presented to the hospital where a small amount of blood was noted in her vagina.  Her cervix at the time of admission was 1 cm dilated and 30 to 40% effaced.  Her hemoglobin and hematocrit at the time of admission was 12.6 and 35.1.  The patient denies any recent trauma to her abdomen and denies any falls.  She had sexual intercourse over a week ago.  Due to vaginal bleeding, the patient is currently receiving a complete course of antenatal corticosteroids.  Her obstetrical history includes 1 C-section followed by 3 subsequent VBACs.  Currently the patient reports that her vaginal bleeding has resolved.  She still reports some achiness in her abdomen.  Her fetal heart rate tracing is reactive with no decelerations.  No contractions are noted on the toco.  The patient had a limited ultrasound performed this morning that showed normal amniotic fluid and the fetus was in the vertex presentation.  There were no sonographic signs of placental abruption noted.  The patient was advised that the cause of her vaginal bleeding remains undetermined.  Possible causes of vaginal bleeding including spontaneous labor and placental abruption were discussed.  As the patient is not having  any contractions, it is unlikely that she is in labor.  The patient was advised that someone with placental abruption will usually present with persistent abdominal pain and vaginal bleeding.  There may also be decelerations noted on the fetal heart rate tracing.  Although no retroplacental clots were noted on today's ultrasound, placental abruption may not always be visualized via ultrasound.  Due to vaginal bleeding in the third trimester, the patient should be observed in the hospital for at least 72 hours.  She may be discharged home after 72 hours of observation should her vaginal bleeding and lower abdominal pain resolve.  However, should she continue to have intermittent spotting and intermittent lower abdominal pain, I would recommend continued inpatient observation.  Delivery would be indicated should she experience significant hemorrhage and /or significant abdominal pain.  The patient should receive the second dose of betamethasone later tonight.    The patient was reassured that the vaginal bleeding she is experiencing will likely not worsen her isoimmunization.  We will continue to follow her closely with middle cerebral artery Doppler studies to assess for fetal anemia should she be discharged.  As the patient has been monitored on continuous monitoring for over 12 hours, she may be taken off of continuous monitoring at around 5 PM this afternoon should the fetal heart rate tracing remain reassuring and reactive.  At the end of the consultation, the patient stated that all of her questions had been answered to her complete satisfaction.  Thank you for referring  this very nice patient for a Maternal-Fetal Medicine consultation.

## 2019-05-14 NOTE — H&P (Addendum)
Please refer to full h&p from CNM below.   38 y.o. [id number] 3660w4d with third trimester bleeding and abdominal pain.  By exam by CNM there was small amount of blood in vault but no active bleeding.  H/H is stable.  Pt has been admitted for betamethasone and observation.  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  Last BPP done 11-2 and dopplers were done on 10-26.  Dopplers due on 11-9.   Hypothyroidism and on thyroid meds- last checked 9-18, will get third tri check here.  AMA with no genetic testing done this pregnancy.  Vitals:   05/14/19 0055 05/14/19 0100 05/14/19 0209 05/14/19 0752  BP:   (!) 147/81 116/69  Pulse:   63 60  Resp:   18 18  Temp:   98.1 F (36.7 C) 97.8 F (36.6 C)  TempSrc:   Oral Oral  SpO2: 97% 98% 100% 98%  Weight:      Height:       FHTs 120s, gSTV, NST R, cat 1  Toco occ  Results for orders placed or performed during the hospital encounter of 05/13/19 (from the past 24 hour(s))  Type and screen [facility]     Status: None (Preliminary result)   Collection Time: 05/14/19  1:25 AM  Result Value Ref Range   ABO/RH(D) AB POS    Antibody Screen POS    Sample Expiration 05/17/2019,2359    Antibody Identification      ANTI E ANTI c Performed at [facility], [address] [place], [place] [address]    Unit Number [id number]    Blood Component Type RED CELLS,LR    Unit division 00    Status of Unit ALLOCATED    Donor AG Type NEGATIVE FOR E ANTIGEN NEGATIVE FOR c ANTIGEN    Transfusion Status OK TO TRANSFUSE    Crossmatch Result COMPATIBLE    Unit Number [id number]    Blood Component Type RED CELLS,LR    Unit division 00    Status of Unit ALLOCATED    Donor AG Type NEGATIVE FOR E ANTIGEN NEGATIVE FOR c ANTIGEN    Transfusion Status OK TO TRANSFUSE    Crossmatch Result COMPATIBLE   CBC on admission     Status: Abnormal    Collection Time: 05/14/19  1:25 AM  Result Value Ref Range   WBC 10.1 4.0 - 10.5 K/uL   RBC 4.00 3.87 - 5.11 MIL/uL   Hemoglobin 12.6 12.0 - 15.0 g/dL   HCT 95.235.1 (L) 84.136.0 - 32.446.0 %   MCV 87.8 80.0 - 100.0 fL   MCH 31.5 26.0 - 34.0 pg   MCHC 35.9 30.0 - 36.0 g/dL   RDW 40.113.5 02.711.5 - 25.315.5 %   Platelets 194 150 - 400 K/uL   nRBC 0.0 0.0 - 0.2 %  SARS CORONAVIRUS 2 (TAT 6-24 HRS) Nasopharyngeal Nasopharyngeal Swab     Status: None   Collection Time: 05/14/19  1:40 AM   Specimen: Nasopharyngeal Swab  Result Value Ref Range   SARS Coronavirus 2 NEGATIVE NEGATIVE   [age] y.o. [id number] 2560w4d with third trimester bleeding and known isoimmunization.  Will have MFM see for any further rec.  For obs and BMZ for now.     Chief Complaint:  Vaginal Bleeding and Abdominal Pain   First Provider Initiated Contact with Patient 05/14/19 0043     HPI: Marisa Campbell  Marisa Campbell is Marisa 38 y.o. [id number] at 1w4dwho presents to maternity admissions reporting generalized abdominal pain and vaginal bleeding tonight.  Abdominal pain started yesterday during the day. . She reports good fetal movement, denies LOF, vaginal bleeding, vaginal itching/burning, urinary symptoms, h/Marisa, dizziness, n/v, diarrhea, constipation or fever/chills.  She denies headache, visual changes or RUQ abdominal pain.  Vaginal Bleeding The patient's primary symptoms include vaginal bleeding. The patient's pertinent negatives include no genital itching, genital lesions or genital odor. This is Marisa new problem. The current episode started today. The problem occurs constantly. The problem has been unchanged. The pain is moderate. The problem affects both sides. She is pregnant. Associated symptoms include abdominal pain, back pain and nausea. Pertinent negatives include no constipation, diarrhea, fever, frequency, headaches or vomiting. The vaginal discharge was bloody. The vaginal bleeding is heavier than menses. She has been passing clots. She has not been  passing tissue. Nothing aggravates the symptoms. She has tried nothing for the symptoms.  Abdominal Pain This is Marisa new problem. The current episode started today. The problem occurs constantly. The problem has been unchanged. The pain is located in the generalized abdominal region. The quality of the pain is cramping (sore). The abdominal pain does not radiate. Associated symptoms include nausea. Pertinent negatives include no constipation, diarrhea, fever, frequency, headaches or vomiting. The pain is aggravated by palpation and movement. The pain is relieved by nothing. She has tried nothing for the symptoms.       Patient Active Problem List   Diagnosis Date Noted  . Vaginal bleeding in pregnancy, third trimester 05/14/2019  . COVID-19 virus infection 02/01/2019  . 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 Lower back started hurting too and felt nauseated. Then rested all today but pain continued. Started having vaginal bleeding at 11:30 pm, underwear soaked and toilet water was bright red, had Marisa clot - golf ball size- then went to hospital. Last sex was Marisa week ago. Baby moving Marisa lot.   Past Medical History:     Past Medical History:  Diagnosis Date  . Anemia   . COVID-19 01/31/2019  . Headache   . 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 Hypothyroidism    on meds currently  . Vaginal Pap smear, abnormal     Past obstetric history:                 OB History  Gravida Para Term Preterm AB Living  7 4 4   2 4   SAB TAB Ectopic Multiple Live Births     1 1     4        # Outcome Date GA Lbr Len/2nd Weight Sex Delivery Anes PTL Lv  7 Current           6 Term 11/08/17 [redacted]w[redacted]d       LIV  5 SAB 2017          4 TAB 2014           3 Term 2008 [redacted]w[redacted]d  4082 g M Vag-Spont   LIV  2 Term 2003 [redacted]w[redacted]d  4338 g M Vag-Spont   LIV  1 Term 2000 [redacted]w[redacted]d  4252 g M CS-LTranv   LIV     Complications: Failure to progress in labor  Past Surgical History:      Past Surgical History:  Procedure Laterality Date  . CESAREAN SECTION      Family History:      Family History  Problem Relation Age of Onset  . Hypertension Father     Social History: Social History        Tobacco Use  . Smoking status: Never Smoker  . Smokeless tobacco: Never Used  Substance Use Topics  . Alcohol use: No    Frequency: Never  . Drug use: No    Allergies: No Known Allergies  Meds:         Medications Prior to Admission  Medication Sig Dispense Refill Last Dose  . aspirin EC 81 MG tablet Take 81 mg by mouth daily.   05/13/2019 at Unknown time  . levothyroxine (SYNTHROID, LEVOTHROID) 175 MCG tablet Take 175 mcg by mouth daily before breakfast.   05/13/2019 at Unknown time  . Prenatal Vit w/Fe-Methylfol-FA (PNV PO) Take 1 tablet by mouth daily.    05/13/2019 at Unknown time  . butalbital-acetaminophen-caffeine (FIORICET) 50-325-40 MG tablet Take 1 tablet by mouth every 4 (four) hours as needed for headache.   More than Marisa month at Unknown time    I have reviewed patient's Past Medical Hx, Surgical Hx, Family Hx, Social Hx, medications and allergies.   ROS:  Review of Systems  Constitutional: Negative for fever.  Gastrointestinal: Positive for abdominal pain and nausea. Negative for constipation, diarrhea and vomiting.  Genitourinary: Positive for vaginal bleeding. Negative for frequency.  Musculoskeletal: Positive for back pain.  Neurological: Negative for headaches.   Other systems negative  Physical Exam  No data found. Constitutional: Well-developed, well-nourished female in no acute distress.  Cardiovascular: normal rate and rhythm Respiratory: normal effort, clear to auscultation  bilaterally GI: Abd soft, non-tender, gravid appropriate for gestational age.   No rebound or guarding. MS: Extremities nontender, no edema, normal ROM Neurologic: Alert and oriented x 4.  GU: Neg CVAT.  PELVIC EXAM: Cervix pink, visually closed, without lesion, small amount of bloody discharge, vaginal walls and external genitalia normal     Dilation: 1 Effacement (%): 30, 40 Cervical Position: Posterior Station: Ballotable Exam by:: [employer] CNM FHT:  Baseline 140 , moderate variability, accelerations present, no decelerations Contractions:  Irregular    Labs: Pending CBC and T/S   Imaging:  Pending  MAU Course/MDM: I have ordered routine labs and antenatal orders   NST reviewed, reactive with irregular mild contractions Consult Dr Jolayne Panther with presentation, exam findings and test results. She recommends admission Dr Dareen Piano consulted with same.  Will get limited US to evaluate placenta    Just had an [place] for growth Marisa few days ago  Assessment: Single IUP at [redacted]w[redacted]d Third Trimester Bleeding Diffuse abdominal pain Irregular mild contractions  Plan: Admit to Antenatal  Routine orders Patient wants to go home to be with 36month old child.  Encouraged to stay overnight and discuss with MD in the am   Wynelle Bourgeois CNM, MSN Certified Nurse-Midwife 05/14/2019 12:43 AM        Cosigned by: Catalina Antigua, MD at 05/14/2019 7:08 AM  Revision History

## 2019-05-14 NOTE — MAU Note (Signed)
Covid swab obtained without difficulty and pt tol well. No symptoms 

## 2019-05-15 DIAGNOSIS — O4693 Antepartum hemorrhage, unspecified, third trimester: Secondary | ICD-10-CM | POA: Diagnosis present

## 2019-05-15 MED ORDER — ASPIRIN 81 MG PO CHEW
81.0000 mg | CHEWABLE_TABLET | Freq: Every day | ORAL | Status: DC
  Administered 2019-05-15 – 2019-05-17 (×3): 81 mg via ORAL
  Filled 2019-05-15 (×3): qty 1

## 2019-05-15 MED ORDER — SODIUM CHLORIDE 0.9% FLUSH
3.0000 mL | Freq: Two times a day (BID) | INTRAVENOUS | Status: DC
  Administered 2019-05-15 – 2019-05-16 (×4): 3 mL via INTRAVENOUS

## 2019-05-15 NOTE — Progress Notes (Signed)
38 y.o. [id number] [redacted]w[redacted]d HD#1 admitted for 32.5WKS BLEEDING.  Pt currently stable with no c/o today.  Good FM.  Vitals:   05/14/19 1941 05/14/19 2306 05/15/19 0543 05/15/19 0800  BP: (!) 106/50 122/71 119/65 106/60  Pulse: 66 69 60 60  Resp: 18 18 18 18   Temp: 97.7 F (36.5 C) 98.2 F (36.8 C) 98.4 F (36.9 C) 98.2 F (36.8 C)  TempSrc: Oral Oral Oral Oral  SpO2: 99% 100% 98% 99%  Weight:      Height:        Lungs CTA Cor RRR Abd  Soft, gravid, nontender Ex SCDs FHTs  120s, good short term variability, NST R Toco  occ  No results found for this or any previous visit (from the past 24 hour(s)).  A:  HD#1  [redacted]w[redacted]d with third trimester bleeding and cat 1 tracing.  P: Per Dr. Annamaria Boots, will continue to observe pt for at least 72 hours in house.  No additional ANT required right now.  Pt is s/p BMZ.  Daria Pastures

## 2019-05-16 ENCOUNTER — Inpatient Hospital Stay (HOSPITAL_COMMUNITY): Payer: Medicaid Other

## 2019-05-16 NOTE — Progress Notes (Signed)
38 y.o. [id number] [redacted]w[redacted]d HD#2 admitted for 82.5WKS BLEEDING.  Pt currently stable with no c/o.  Good FM.  Vitals:   05/15/19 1209 05/15/19 1630 05/15/19 1934 05/16/19 0537  BP: (!) 109/59 (!) 118/57 129/71 118/63  Pulse: (!) 55 (!) 58 (!) 58 (!) 54  Resp: 18 17 18 17   Temp: 98.5 F (36.9 C) 98.7 F (37.1 C) 98.3 F (36.8 C) 97.9 F (36.6 C)  TempSrc: Oral Oral Oral Oral  SpO2: 100% 98% 100% 99%  Weight:      Height:        Lungs CTA Cor RRR Abd  Soft, gravid, nontender Ex SCDs FHTs  Last night 130s, good short term variability, NST R Toco  occ  No results found for this or any previous visit (from the past 24 hour(s)).  A:  HD#2  [redacted]w[redacted]d with third tri bleeding.  P: For observation for three days without new bleeding.  S/p BMZ. 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  MFM did not have any further recs except for pt to stay for 3 days.  Pt has hypothyroid and TFTs were normal on current dose for third trimester.  Daria Pastures

## 2019-05-17 ENCOUNTER — Ambulatory Visit (HOSPITAL_COMMUNITY): Payer: Medicaid Other

## 2019-05-17 ENCOUNTER — Ambulatory Visit (HOSPITAL_COMMUNITY): Admission: RE | Admit: 2019-05-17 | Payer: Medicaid Other | Source: Ambulatory Visit

## 2019-05-17 ENCOUNTER — Inpatient Hospital Stay (HOSPITAL_COMMUNITY): Payer: Medicaid Other

## 2019-05-17 DIAGNOSIS — O99280 Endocrine, nutritional and metabolic diseases complicating pregnancy, unspecified trimester: Secondary | ICD-10-CM

## 2019-05-17 DIAGNOSIS — Z362 Encounter for other antenatal screening follow-up: Secondary | ICD-10-CM

## 2019-05-17 DIAGNOSIS — O99213 Obesity complicating pregnancy, third trimester: Secondary | ICD-10-CM

## 2019-05-17 DIAGNOSIS — O4693 Antepartum hemorrhage, unspecified, third trimester: Secondary | ICD-10-CM

## 2019-05-17 DIAGNOSIS — O34219 Maternal care for unspecified type scar from previous cesarean delivery: Secondary | ICD-10-CM

## 2019-05-17 DIAGNOSIS — E039 Hypothyroidism, unspecified: Secondary | ICD-10-CM

## 2019-05-17 DIAGNOSIS — Z3A34 34 weeks gestation of pregnancy: Secondary | ICD-10-CM

## 2019-05-17 DIAGNOSIS — O360930 Maternal care for other rhesus isoimmunization, third trimester, not applicable or unspecified: Secondary | ICD-10-CM

## 2019-05-17 DIAGNOSIS — O09523 Supervision of elderly multigravida, third trimester: Secondary | ICD-10-CM

## 2019-05-17 NOTE — Discharge Summary (Signed)
Physician Discharge Summary  Patient ID: Marisa Campbell MRN: [id number] DOB/AGE: 1981/05/22 38 y.o.  Admit date: 05/13/2019 Discharge date: 05/17/2019  Admission Diagnoses:  Discharge Diagnoses:  Active Problems:   Vaginal bleeding in pregnancy, third trimester   [redacted] weeks gestation of pregnancy   Third trimester bleeding, antepartum   Discharged Condition: good  Hospital Course: Marisa Campbell admitted at [redacted]w[redacted]d with vaginal bleeding and abdominal pain.  She was admitted to antepartum for continuous fetal monitoring and betamethasone series.  She received an MFM consultation.  Bleeding quickly tapered to spotting and stopped.  Contractions / abdominal pain resolved.  She has now been >72 hr since last bleed.  BPP 8/8 today.  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 Per MFM, ok to discharge to home  Consults: MFM  Significant Diagnostic Studies: BPP and MCA dopplers  Treatments: observation   Discharge Exam: Blood pressure (!) 114/53, pulse 63, temperature 97.7 F (36.5 C), temperature source Oral, resp. rate 18, height 5' 2.5" (1.588 m), weight 123.8 kg, last menstrual period 09/21/2018, SpO2 100 %, unknown if currently breastfeeding. NAD Abdomen: soft, gravid, non-tender No blood on peripad  Disposition: Discharge disposition: 01-Home or Self Care       Discharge Instructions    Discharge activity:   Complete by: As directed    Reduced intensity of daily physical activity   Discharge diet:  No restrictions   Complete by: As directed    Do not have sex or do anything that might make you have an orgasm   Complete by: As directed    Fetal Kick Count:  Lie on our left side for one hour after a meal, and count the number of times your baby kicks.  If it is less than 5 times, get up, move around and drink some juice.  Repeat the test 30 minutes later.  If it is still less than 5 kicks in an hour, notify your doctor.   Complete by: As directed    Notify  physician for a general feeling that "something is not right"   Complete by: As directed    Notify physician for increase or change in vaginal discharge   Complete by: As directed    Notify physician for intestinal cramps, with or without diarrhea, sometimes described as "gas pain"   Complete by: As directed    Notify physician for leaking of fluid   Complete by: As directed    Notify physician for low, dull backache, unrelieved by heat or Tylenol   Complete by: As directed    Notify physician for menstrual like cramps   Complete by: As directed    Notify physician for pelvic pressure   Complete by: As directed    Notify physician for uterine contractions.  These may be painless and feel like the uterus is tightening or the baby is  "balling up"   Complete by: As directed    Notify physician for vaginal bleeding   Complete by: As directed    PRETERM LABOR:  Includes any of the follwing symptoms that occur between 20 - [redacted] weeks gestation.  If these symptoms are not stopped, preterm labor can result in preterm delivery, placing your baby at risk   Complete by: As directed      Allergies as of 05/17/2019   No Known Allergies     Medication List    TAKE these medications   aspirin EC 81 MG tablet Take 81 mg by mouth daily.   butalbital-acetaminophen-caffeine  50-325-40 MG tablet Commonly known as: FIORICET Take 1 tablet by mouth every 4 (four) hours as needed for headache.   calcium carbonate 500 MG chewable tablet Commonly known as: TUMS - dosed in mg elemental calcium Chew 2 tablets by mouth as needed for indigestion or heartburn.   CitraNatal B-Calm 20-1 MG & 2 x 25 MG Misc Take 3 tablets by mouth daily.   Euthyrox 125 MCG tablet Generic drug: levothyroxine Take 125 mcg by mouth daily.   valACYclovir 1000 MG tablet Commonly known as: VALTREX Take 1,000 mg by mouth daily. As needed for repeat cold sore      Follow-up Information    Ob/Gyn, Esm[facility] Follow up on  05/21/2019.   Why: please call to schedule your NST for 11/13.  Contact information: [address] [place] [address] [phone]           Signed: Beryle Quant GEFFEL Marisa Campbell 05/17/2019, 12:13 PM

## 2019-05-17 NOTE — Progress Notes (Signed)
38 y.o. [id number] [redacted]w[redacted]d HD#3 admitted for 26.5WKS BLEEDING.  Doing well. Has not had any additional bleeding since admission.  Abdominal pain and contractions have resolved.  She had her previously scheduled [place] w MFM this AM for MCA dopplers and results are pending.   Vitals:   05/16/19 1946 05/16/19 2221 05/17/19 0555 05/17/19 0802  BP: (!) 112/55 (!) 118/58 113/66 (!) 112/51  Pulse: 64 (!) 59 (!) 58 (!) 54  Resp: 18 18 16 18   Temp: 98.3 F (36.8 C) 97.8 F (36.6 C) 97.8 F (36.6 C) 98.2 F (36.8 C)  TempSrc:  Oral Oral Oral  SpO2:  100% 99% 100%  Weight:      Height:       NAD Abd  Soft, gravid, nontender Ext:  No edema FHTs currently 130s, moderate variability, + accels, no decels Toco  quiet  No results found for this or any previous visit (from the past 24 hour(s)).  A:  HD#3  106w0d with third trimester bleeding P: Vaginal bleeding in third trimester--no additional bleeding since admission 3 days ago.  Abomdinal pain has resolved.  S/p MFM consultation--appreciate recs.  OK for discharge to home today pending results of previously scheduled MFM scan for AntiE/Anti c isoimmunization S/p BMZ Hypothryoidism--on synthroid Keep NST on 11/13   T[place]

## 2019-05-17 NOTE — Progress Notes (Signed)
Final read of [place]:   BPP 8/8.   No sign of fetal anemia--MCA dopplers < 1.5 MoM. OK for d/c to home today  Continue weekly fetal testing / MCA dopplers w MFM in 1 week Will f/u in our office on Friday

## 2019-05-18 ENCOUNTER — Encounter: Payer: Self-pay | Admitting: Advanced Practice Midwife

## 2019-05-18 DIAGNOSIS — R768 Other specified abnormal immunological findings in serum: Secondary | ICD-10-CM | POA: Insufficient documentation

## 2019-05-18 DIAGNOSIS — R7689 Other specified abnormal immunological findings in serum: Secondary | ICD-10-CM | POA: Insufficient documentation

## 2019-05-18 LAB — TYPE AND SCREEN
ABO/RH(D): AB POS
Antibody Screen: POSITIVE
Donor AG Type: NEGATIVE
Donor AG Type: NEGATIVE
Unit division: 0
Unit division: 0

## 2019-05-18 LAB — BPAM RBC
Blood Product Expiration Date: 202012092359
Blood Product Expiration Date: 202012152359
Unit Type and Rh: 5100
Unit Type and Rh: 6200

## 2019-05-25 ENCOUNTER — Other Ambulatory Visit (HOSPITAL_COMMUNITY): Payer: Self-pay | Admitting: Obstetrics

## 2019-05-25 ENCOUNTER — Ambulatory Visit (HOSPITAL_COMMUNITY): Payer: Medicaid Other | Admitting: *Deleted

## 2019-05-25 ENCOUNTER — Encounter (HOSPITAL_COMMUNITY): Payer: Self-pay

## 2019-05-25 ENCOUNTER — Other Ambulatory Visit: Payer: Self-pay

## 2019-05-25 ENCOUNTER — Other Ambulatory Visit (HOSPITAL_COMMUNITY): Payer: Self-pay | Admitting: *Deleted

## 2019-05-25 ENCOUNTER — Ambulatory Visit (HOSPITAL_COMMUNITY)
Admission: RE | Admit: 2019-05-25 | Discharge: 2019-05-25 | Disposition: A | Discharge status: Home / Self Care | Payer: Medicaid Other | Source: Ambulatory Visit | Attending: Obstetrics | Admitting: Obstetrics

## 2019-05-25 VITALS — BP 111/72 | HR 66 | Temp 98.6°F

## 2019-05-25 DIAGNOSIS — O4103X0 Oligohydramnios, third trimester, not applicable or unspecified: Secondary | ICD-10-CM

## 2019-05-25 DIAGNOSIS — O0990 Supervision of high risk pregnancy, unspecified, unspecified trimester: Secondary | ICD-10-CM | POA: Insufficient documentation

## 2019-05-25 DIAGNOSIS — O34219 Maternal care for unspecified type scar from previous cesarean delivery: Secondary | ICD-10-CM

## 2019-05-25 DIAGNOSIS — O99283 Endocrine, nutritional and metabolic diseases complicating pregnancy, third trimester: Secondary | ICD-10-CM

## 2019-05-25 DIAGNOSIS — O09213 Supervision of pregnancy with history of pre-term labor, third trimester: Secondary | ICD-10-CM | POA: Diagnosis not present

## 2019-05-25 DIAGNOSIS — O360930 Maternal care for other rhesus isoimmunization, third trimester, not applicable or unspecified: Secondary | ICD-10-CM

## 2019-05-25 DIAGNOSIS — Z3A35 35 weeks gestation of pregnancy: Secondary | ICD-10-CM

## 2019-05-25 DIAGNOSIS — O09523 Supervision of elderly multigravida, third trimester: Secondary | ICD-10-CM | POA: Diagnosis present

## 2019-05-25 DIAGNOSIS — O4693 Antepartum hemorrhage, unspecified, third trimester: Secondary | ICD-10-CM | POA: Diagnosis not present

## 2019-05-25 DIAGNOSIS — E039 Hypothyroidism, unspecified: Secondary | ICD-10-CM

## 2019-05-25 NOTE — Procedures (Signed)
LYFE REIHL 02-19-1981 [redacted]w[redacted]d  Fetus A Non-Stress Test Interpretation for 05/25/19  Indication: Decreased amniotic fluid volume  Fetal Heart Rate A Mode: External Baseline Rate (A): 135 bpm Variability: Moderate Accelerations: 15 x 15 Decelerations: None Multiple birth?: No  Uterine Activity Mode: Palpation, Toco Contraction Frequency (min): 1 UC w/UI Contraction Duration (sec): 10-60 Contraction Quality: Mild Resting Tone Palpated: Relaxed Resting Time: Adequate  Interpretation (Fetal Testing) Nonstress Test Interpretation: Reactive

## 2019-05-28 ENCOUNTER — Encounter (HOSPITAL_COMMUNITY): Payer: Self-pay

## 2019-05-28 ENCOUNTER — Ambulatory Visit (HOSPITAL_COMMUNITY): Payer: Medicaid Other

## 2019-05-28 ENCOUNTER — Ambulatory Visit (HOSPITAL_COMMUNITY)
Admission: RE | Admit: 2019-05-28 | Discharge: 2019-05-28 | Disposition: A | Discharge status: Home / Self Care | Payer: Medicaid Other | Source: Ambulatory Visit | Attending: Obstetrics and Gynecology | Admitting: Obstetrics and Gynecology

## 2019-05-28 ENCOUNTER — Other Ambulatory Visit: Payer: Self-pay

## 2019-05-28 ENCOUNTER — Ambulatory Visit (HOSPITAL_COMMUNITY): Payer: Medicaid Other | Admitting: *Deleted

## 2019-05-28 ENCOUNTER — Other Ambulatory Visit (HOSPITAL_COMMUNITY): Payer: Self-pay | Admitting: Obstetrics and Gynecology

## 2019-05-28 VITALS — BP 123/70 | HR 64 | Temp 97.6°F

## 2019-05-28 DIAGNOSIS — O0990 Supervision of high risk pregnancy, unspecified, unspecified trimester: Secondary | ICD-10-CM | POA: Diagnosis present

## 2019-05-28 DIAGNOSIS — O4103X0 Oligohydramnios, third trimester, not applicable or unspecified: Secondary | ICD-10-CM | POA: Diagnosis not present

## 2019-05-28 DIAGNOSIS — O360930 Maternal care for other rhesus isoimmunization, third trimester, not applicable or unspecified: Secondary | ICD-10-CM | POA: Diagnosis not present

## 2019-05-28 DIAGNOSIS — O99213 Obesity complicating pregnancy, third trimester: Secondary | ICD-10-CM

## 2019-05-28 DIAGNOSIS — O4693 Antepartum hemorrhage, unspecified, third trimester: Secondary | ICD-10-CM

## 2019-05-28 DIAGNOSIS — O34219 Maternal care for unspecified type scar from previous cesarean delivery: Secondary | ICD-10-CM

## 2019-05-28 DIAGNOSIS — O09523 Supervision of elderly multigravida, third trimester: Secondary | ICD-10-CM

## 2019-05-28 DIAGNOSIS — E039 Hypothyroidism, unspecified: Secondary | ICD-10-CM

## 2019-05-28 DIAGNOSIS — O99283 Endocrine, nutritional and metabolic diseases complicating pregnancy, third trimester: Secondary | ICD-10-CM

## 2019-05-28 DIAGNOSIS — Z3A35 35 weeks gestation of pregnancy: Secondary | ICD-10-CM

## 2019-05-31 ENCOUNTER — Encounter (HOSPITAL_COMMUNITY): Payer: Self-pay | Admitting: *Deleted

## 2019-05-31 ENCOUNTER — Other Ambulatory Visit: Payer: Self-pay

## 2019-05-31 ENCOUNTER — Ambulatory Visit (HOSPITAL_BASED_OUTPATIENT_CLINIC_OR_DEPARTMENT_OTHER)
Admission: RE | Admit: 2019-05-31 | Discharge: 2019-05-31 | Disposition: A | Discharge status: Home / Self Care | Payer: Medicaid Other | Source: Ambulatory Visit | Attending: Obstetrics and Gynecology | Admitting: Obstetrics and Gynecology

## 2019-05-31 ENCOUNTER — Ambulatory Visit (HOSPITAL_COMMUNITY): Payer: Medicaid Other | Admitting: *Deleted

## 2019-05-31 ENCOUNTER — Other Ambulatory Visit (HOSPITAL_COMMUNITY): Payer: Self-pay | Admitting: Obstetrics and Gynecology

## 2019-05-31 VITALS — BP 129/71 | HR 53 | Temp 97.6°F

## 2019-05-31 DIAGNOSIS — O360930 Maternal care for other rhesus isoimmunization, third trimester, not applicable or unspecified: Secondary | ICD-10-CM | POA: Diagnosis not present

## 2019-05-31 DIAGNOSIS — O4103X0 Oligohydramnios, third trimester, not applicable or unspecified: Secondary | ICD-10-CM

## 2019-05-31 DIAGNOSIS — O09529 Supervision of elderly multigravida, unspecified trimester: Secondary | ICD-10-CM | POA: Insufficient documentation

## 2019-05-31 DIAGNOSIS — Z3A36 36 weeks gestation of pregnancy: Secondary | ICD-10-CM

## 2019-05-31 DIAGNOSIS — E039 Hypothyroidism, unspecified: Secondary | ICD-10-CM

## 2019-05-31 DIAGNOSIS — O09523 Supervision of elderly multigravida, third trimester: Secondary | ICD-10-CM

## 2019-05-31 DIAGNOSIS — O34219 Maternal care for unspecified type scar from previous cesarean delivery: Secondary | ICD-10-CM

## 2019-05-31 DIAGNOSIS — O4693 Antepartum hemorrhage, unspecified, third trimester: Secondary | ICD-10-CM

## 2019-05-31 DIAGNOSIS — O99213 Obesity complicating pregnancy, third trimester: Secondary | ICD-10-CM

## 2019-05-31 DIAGNOSIS — O99283 Endocrine, nutritional and metabolic diseases complicating pregnancy, third trimester: Secondary | ICD-10-CM

## 2019-06-01 ENCOUNTER — Inpatient Hospital Stay (HOSPITAL_COMMUNITY): Payer: Medicaid Other

## 2019-06-01 ENCOUNTER — Inpatient Hospital Stay (HOSPITAL_COMMUNITY): Payer: Medicaid Other | Admitting: Certified Registered Nurse Anesthetist

## 2019-06-01 ENCOUNTER — Inpatient Hospital Stay (HOSPITAL_COMMUNITY): Payer: Medicaid Other | Admitting: Anesthesiology

## 2019-06-01 ENCOUNTER — Encounter (HOSPITAL_COMMUNITY)
Admission: AD | Disposition: A | Discharge status: Home / Self Care | Payer: Self-pay | Source: Home / Self Care | Attending: Obstetrics and Gynecology

## 2019-06-01 ENCOUNTER — Inpatient Hospital Stay (HOSPITAL_COMMUNITY)
Admission: AD | Admit: 2019-06-01 | Discharge: 2019-06-04 | DRG: 787 | Disposition: A | Discharge status: Home / Self Care | Payer: Medicaid Other | Attending: Obstetrics and Gynecology | Admitting: Obstetrics and Gynecology

## 2019-06-01 ENCOUNTER — Encounter (HOSPITAL_COMMUNITY): Payer: Self-pay | Admitting: *Deleted

## 2019-06-01 DIAGNOSIS — Z20828 Contact with and (suspected) exposure to other viral communicable diseases: Secondary | ICD-10-CM | POA: Diagnosis present

## 2019-06-01 DIAGNOSIS — O99214 Obesity complicating childbirth: Secondary | ICD-10-CM | POA: Diagnosis present

## 2019-06-01 DIAGNOSIS — Z7982 Long term (current) use of aspirin: Secondary | ICD-10-CM

## 2019-06-01 DIAGNOSIS — O360930 Maternal care for other rhesus isoimmunization, third trimester, not applicable or unspecified: Secondary | ICD-10-CM | POA: Diagnosis present

## 2019-06-01 DIAGNOSIS — A6000 Herpesviral infection of urogenital system, unspecified: Secondary | ICD-10-CM | POA: Diagnosis present

## 2019-06-01 DIAGNOSIS — Z3A36 36 weeks gestation of pregnancy: Secondary | ICD-10-CM

## 2019-06-01 DIAGNOSIS — Z3490 Encounter for supervision of normal pregnancy, unspecified, unspecified trimester: Secondary | ICD-10-CM

## 2019-06-01 DIAGNOSIS — O321XX0 Maternal care for breech presentation, not applicable or unspecified: Secondary | ICD-10-CM | POA: Diagnosis present

## 2019-06-01 DIAGNOSIS — R768 Other specified abnormal immunological findings in serum: Secondary | ICD-10-CM

## 2019-06-01 DIAGNOSIS — E039 Hypothyroidism, unspecified: Secondary | ICD-10-CM | POA: Diagnosis present

## 2019-06-01 DIAGNOSIS — O34219 Maternal care for unspecified type scar from previous cesarean delivery: Secondary | ICD-10-CM | POA: Diagnosis present

## 2019-06-01 DIAGNOSIS — O99284 Endocrine, nutritional and metabolic diseases complicating childbirth: Secondary | ICD-10-CM | POA: Diagnosis present

## 2019-06-01 DIAGNOSIS — O9832 Other infections with a predominantly sexual mode of transmission complicating childbirth: Secondary | ICD-10-CM | POA: Diagnosis present

## 2019-06-01 DIAGNOSIS — O361191 Maternal care for Anti-A sensitization, unspecified trimester, fetus 1: Secondary | ICD-10-CM

## 2019-06-01 LAB — CBC
HCT: 34.6 % — ABNORMAL LOW (ref 36.0–46.0)
Hemoglobin: 12 g/dL (ref 12.0–15.0)
MCH: 30.5 pg (ref 26.0–34.0)
MCHC: 34.7 g/dL (ref 30.0–36.0)
MCV: 88 fL (ref 80.0–100.0)
Platelets: 187 10*3/uL (ref 150–400)
RBC: 3.93 MIL/uL (ref 3.87–5.11)
RDW: 13.3 % (ref 11.5–15.5)
WBC: 8.9 10*3/uL (ref 4.0–10.5)
nRBC: 0 % (ref 0.0–0.2)

## 2019-06-01 LAB — SARS CORONAVIRUS 2 (TAT 6-24 HRS): SARS Coronavirus 2: NEGATIVE

## 2019-06-01 SURGERY — Surgical Case
Anesthesia: Spinal | Site: Abdomen | Wound class: Clean Contaminated

## 2019-06-01 MED ORDER — OXYTOCIN 40 UNITS IN NORMAL SALINE INFUSION - SIMPLE MED
1.0000 m[IU]/min | INTRAVENOUS | Status: DC
  Administered 2019-06-01: 12:00:00 2 m[IU]/min via INTRAVENOUS

## 2019-06-01 MED ORDER — TETANUS-DIPHTH-ACELL PERTUSSIS 5-2.5-18.5 LF-MCG/0.5 IM SUSP: 0.5000 mL | Freq: Once | INTRAMUSCULAR | Status: DC

## 2019-06-01 MED ORDER — LACTATED RINGERS IV SOLN
INTRAVENOUS | Status: DC
  Administered 2019-06-01: 15:00:00 via INTRAVENOUS

## 2019-06-01 MED ORDER — SCOPOLAMINE 1 MG/3DAYS TD PT72
MEDICATED_PATCH | TRANSDERMAL | Status: DC | PRN
  Administered 2019-06-01: 1 via TRANSDERMAL

## 2019-06-01 MED ORDER — NALBUPHINE HCL 10 MG/ML IJ SOLN
5.0000 mg | Freq: Once | INTRAMUSCULAR | Status: DC | PRN
  Filled 2019-06-01: qty 0.5

## 2019-06-01 MED ORDER — METOCLOPRAMIDE HCL 5 MG/ML IJ SOLN
INTRAMUSCULAR | Status: DC | PRN
  Administered 2019-06-01 (×2): 5 mg via INTRAVENOUS

## 2019-06-01 MED ORDER — ACETAMINOPHEN 325 MG PO TABS: 650.0000 mg | ORAL_TABLET | ORAL | Status: DC | PRN

## 2019-06-01 MED ORDER — BUPIVACAINE IN DEXTROSE 0.75-8.25 % IT SOLN
INTRATHECAL | Status: DC | PRN
  Administered 2019-06-01: 1.6 mg via INTRATHECAL

## 2019-06-01 MED ORDER — ONDANSETRON HCL 4 MG/2ML IJ SOLN
4.0000 mg | Freq: Four times a day (QID) | INTRAMUSCULAR | Status: DC | PRN
  Administered 2019-06-01: 4 mg via INTRAVENOUS

## 2019-06-01 MED ORDER — SCOPOLAMINE 1 MG/3DAYS TD PT72
1.0000 | MEDICATED_PATCH | Freq: Once | TRANSDERMAL | Status: DC
  Administered 2019-06-01: 22:00:00 1.5 mg via TRANSDERMAL

## 2019-06-01 MED ORDER — PENICILLIN G POT IN DEXTROSE 60000 UNIT/ML IV SOLN: 3.0000 10*6.[IU] | INTRAVENOUS | Status: DC

## 2019-06-01 MED ORDER — DIPHENHYDRAMINE HCL 50 MG/ML IJ SOLN: 12.5000 mg | INTRAMUSCULAR | Status: DC | PRN

## 2019-06-01 MED ORDER — OXYTOCIN 40 UNITS IN NORMAL SALINE INFUSION - SIMPLE MED
2.5000 [IU]/h | INTRAVENOUS | Status: AC
  Administered 2019-06-01: 2.5 [IU]/h via INTRAVENOUS

## 2019-06-01 MED ORDER — PRENATAL MULTIVITAMIN CH
1.0000 | ORAL_TABLET | Freq: Every day | ORAL | Status: DC
  Administered 2019-06-02 – 2019-06-03 (×2): 1 via ORAL
  Filled 2019-06-01 (×2): qty 1

## 2019-06-01 MED ORDER — ONDANSETRON HCL 4 MG/2ML IJ SOLN
INTRAMUSCULAR | Status: AC
  Filled 2019-06-01: qty 2

## 2019-06-01 MED ORDER — SCOPOLAMINE 1 MG/3DAYS TD PT72
MEDICATED_PATCH | TRANSDERMAL | Status: AC
  Filled 2019-06-01: qty 1

## 2019-06-01 MED ORDER — ONDANSETRON HCL 4 MG/2ML IJ SOLN
4.0000 mg | Freq: Three times a day (TID) | INTRAMUSCULAR | Status: DC | PRN
  Administered 2019-06-01: 21:00:00 4 mg via INTRAVENOUS

## 2019-06-01 MED ORDER — LACTATED RINGERS IV SOLN
INTRAVENOUS | Status: DC
  Administered 2019-06-02: 03:00:00 via INTRAVENOUS

## 2019-06-01 MED ORDER — LACTATED RINGERS IV SOLN
500.0000 mL | INTRAVENOUS | Status: DC | PRN
  Administered 2019-06-01: 18:00:00 1000 mL via INTRAVENOUS

## 2019-06-01 MED ORDER — MEPERIDINE HCL 25 MG/ML IJ SOLN: 6.2500 mg | INTRAMUSCULAR | Status: DC | PRN

## 2019-06-01 MED ORDER — OXYCODONE-ACETAMINOPHEN 5-325 MG PO TABS: 1.0000 | ORAL_TABLET | ORAL | Status: DC | PRN

## 2019-06-01 MED ORDER — WITCH HAZEL-GLYCERIN EX PADS: 1.0000 "application " | MEDICATED_PAD | CUTANEOUS | Status: DC | PRN

## 2019-06-01 MED ORDER — ACETAMINOPHEN 500 MG PO TABS
1000.0000 mg | ORAL_TABLET | Freq: Four times a day (QID) | ORAL | Status: AC
  Administered 2019-06-02: 1000 mg via ORAL
  Filled 2019-06-01 (×3): qty 2

## 2019-06-01 MED ORDER — DIPHENHYDRAMINE HCL 25 MG PO CAPS: 25.0000 mg | ORAL_CAPSULE | ORAL | Status: DC | PRN

## 2019-06-01 MED ORDER — COCONUT OIL OIL
1.0000 "application " | TOPICAL_OIL | Status: DC | PRN
  Administered 2019-06-04: 1 via TOPICAL

## 2019-06-01 MED ORDER — FENTANYL CITRATE (PF) 100 MCG/2ML IJ SOLN: 25.0000 ug | INTRAMUSCULAR | Status: DC | PRN

## 2019-06-01 MED ORDER — ACETAMINOPHEN 10 MG/ML IV SOLN: 1000.0000 mg | Freq: Once | INTRAVENOUS | Status: DC | PRN

## 2019-06-01 MED ORDER — PROMETHAZINE HCL 25 MG/ML IJ SOLN
6.2500 mg | Freq: Once | INTRAMUSCULAR | Status: AC
  Administered 2019-06-01: 21:00:00 6.25 mg via INTRAVENOUS

## 2019-06-01 MED ORDER — NALBUPHINE HCL 10 MG/ML IJ SOLN
5.0000 mg | INTRAMUSCULAR | Status: DC | PRN
  Filled 2019-06-01: qty 0.5

## 2019-06-01 MED ORDER — KETOROLAC TROMETHAMINE 30 MG/ML IJ SOLN: 30.0000 mg | Freq: Once | INTRAMUSCULAR | Status: DC | PRN

## 2019-06-01 MED ORDER — OXYTOCIN BOLUS FROM INFUSION: 500.0000 mL | Freq: Once | INTRAVENOUS | Status: DC

## 2019-06-01 MED ORDER — KETOROLAC TROMETHAMINE 30 MG/ML IJ SOLN
30.0000 mg | Freq: Four times a day (QID) | INTRAMUSCULAR | Status: AC | PRN
  Administered 2019-06-01: 22:00:00 30 mg via INTRAVENOUS

## 2019-06-01 MED ORDER — SOD CITRATE-CITRIC ACID 500-334 MG/5ML PO SOLN
30.0000 mL | ORAL | Status: DC | PRN
  Administered 2019-06-01: 18:00:00 30 mL via ORAL
  Filled 2019-06-01: qty 30

## 2019-06-01 MED ORDER — DIBUCAINE (PERIANAL) 1 % EX OINT: 1.0000 "application " | TOPICAL_OINTMENT | CUTANEOUS | Status: DC | PRN

## 2019-06-01 MED ORDER — ZOLPIDEM TARTRATE 5 MG PO TABS: 5.0000 mg | ORAL_TABLET | Freq: Every evening | ORAL | Status: DC | PRN

## 2019-06-01 MED ORDER — FENTANYL CITRATE (PF) 100 MCG/2ML IJ SOLN
INTRAMUSCULAR | Status: DC | PRN
  Administered 2019-06-01: 15 ug via INTRATHECAL

## 2019-06-01 MED ORDER — NALOXONE HCL 4 MG/10ML IJ SOLN
1.0000 ug/kg/h | INTRAVENOUS | Status: DC | PRN
  Filled 2019-06-01: qty 5

## 2019-06-01 MED ORDER — MORPHINE SULFATE (PF) 0.5 MG/ML IJ SOLN
INTRAMUSCULAR | Status: AC
  Filled 2019-06-01: qty 10

## 2019-06-01 MED ORDER — OXYCODONE-ACETAMINOPHEN 5-325 MG PO TABS: 2.0000 | ORAL_TABLET | ORAL | Status: DC | PRN

## 2019-06-01 MED ORDER — DEXTROSE 5 % IV SOLN
INTRAVENOUS | Status: AC
  Filled 2019-06-01: qty 3000

## 2019-06-01 MED ORDER — LEVOTHYROXINE SODIUM 75 MCG PO TABS
125.0000 ug | ORAL_TABLET | Freq: Every day | ORAL | Status: DC
  Administered 2019-06-02 – 2019-06-04 (×3): 125 ug via ORAL
  Filled 2019-06-01 (×3): qty 1

## 2019-06-01 MED ORDER — NALOXONE HCL 0.4 MG/ML IJ SOLN: 0.4000 mg | INTRAMUSCULAR | Status: DC | PRN

## 2019-06-01 MED ORDER — SODIUM CHLORIDE 0.9 % IV SOLN
5.0000 10*6.[IU] | Freq: Once | INTRAVENOUS | Status: AC
  Administered 2019-06-01: 14:00:00 5 10*6.[IU] via INTRAVENOUS
  Filled 2019-06-01: qty 5

## 2019-06-01 MED ORDER — TRAMADOL HCL 50 MG PO TABS: 50.0000 mg | ORAL_TABLET | Freq: Four times a day (QID) | ORAL | Status: DC | PRN

## 2019-06-01 MED ORDER — LIDOCAINE HCL (PF) 1 % IJ SOLN: 30.0000 mL | INTRAMUSCULAR | Status: DC | PRN

## 2019-06-01 MED ORDER — LACTATED RINGERS IV SOLN
INTRAVENOUS | Status: DC | PRN
  Administered 2019-06-01 (×2): via INTRAVENOUS

## 2019-06-01 MED ORDER — KETOROLAC TROMETHAMINE 30 MG/ML IJ SOLN
INTRAMUSCULAR | Status: AC
  Filled 2019-06-01: qty 1

## 2019-06-01 MED ORDER — DEXAMETHASONE SODIUM PHOSPHATE 10 MG/ML IJ SOLN
INTRAMUSCULAR | Status: DC | PRN
  Administered 2019-06-01: 10 mg via INTRAVENOUS

## 2019-06-01 MED ORDER — OXYTOCIN 40 UNITS IN NORMAL SALINE INFUSION - SIMPLE MED
2.5000 [IU]/h | INTRAVENOUS | Status: DC
  Filled 2019-06-01: qty 1000

## 2019-06-01 MED ORDER — DEXAMETHASONE SODIUM PHOSPHATE 10 MG/ML IJ SOLN
INTRAMUSCULAR | Status: AC
  Filled 2019-06-01: qty 1

## 2019-06-01 MED ORDER — MEASLES, MUMPS & RUBELLA VAC IJ SOLR: 0.5000 mL | Freq: Once | INTRAMUSCULAR | Status: DC

## 2019-06-01 MED ORDER — PHENYLEPHRINE HCL-NACL 20-0.9 MG/250ML-% IV SOLN
INTRAVENOUS | Status: DC | PRN
  Administered 2019-06-01: 60 ug/min via INTRAVENOUS

## 2019-06-01 MED ORDER — PHENYLEPHRINE HCL-NACL 20-0.9 MG/250ML-% IV SOLN
INTRAVENOUS | Status: AC
  Filled 2019-06-01: qty 250

## 2019-06-01 MED ORDER — OXYTOCIN 40 UNITS IN NORMAL SALINE INFUSION - SIMPLE MED
INTRAVENOUS | Status: DC | PRN
  Administered 2019-06-01: 40 [IU] via INTRAVENOUS

## 2019-06-01 MED ORDER — PROMETHAZINE HCL 25 MG/ML IJ SOLN
INTRAMUSCULAR | Status: AC
  Filled 2019-06-01: qty 1

## 2019-06-01 MED ORDER — MORPHINE SULFATE (PF) 0.5 MG/ML IJ SOLN
INTRAMUSCULAR | Status: DC | PRN
  Administered 2019-06-01: .15 mg via INTRATHECAL

## 2019-06-01 MED ORDER — SENNOSIDES-DOCUSATE SODIUM 8.6-50 MG PO TABS
2.0000 | ORAL_TABLET | ORAL | Status: DC
  Administered 2019-06-01 – 2019-06-03 (×3): 2 via ORAL
  Filled 2019-06-01 (×3): qty 2

## 2019-06-01 MED ORDER — KETOROLAC TROMETHAMINE 30 MG/ML IJ SOLN: 30.0000 mg | Freq: Four times a day (QID) | INTRAMUSCULAR | Status: AC | PRN

## 2019-06-01 MED ORDER — SODIUM CHLORIDE 0.9% FLUSH: 3.0000 mL | INTRAVENOUS | Status: DC | PRN

## 2019-06-01 MED ORDER — FENTANYL CITRATE (PF) 100 MCG/2ML IJ SOLN
INTRAMUSCULAR | Status: AC
  Filled 2019-06-01: qty 2

## 2019-06-01 MED ORDER — SIMETHICONE 80 MG PO CHEW
80.0000 mg | CHEWABLE_TABLET | ORAL | Status: DC
  Administered 2019-06-01 – 2019-06-03 (×3): 80 mg via ORAL
  Filled 2019-06-01 (×3): qty 1

## 2019-06-01 MED ORDER — TERBUTALINE SULFATE 1 MG/ML IJ SOLN: 0.2500 mg | Freq: Once | INTRAMUSCULAR | Status: DC | PRN

## 2019-06-01 MED ORDER — SODIUM CHLORIDE 0.9 % IR SOLN
Status: DC | PRN
  Administered 2019-06-01: 1

## 2019-06-01 MED ORDER — METOCLOPRAMIDE HCL 5 MG/ML IJ SOLN
INTRAMUSCULAR | Status: AC
  Filled 2019-06-01: qty 2

## 2019-06-01 MED ORDER — SODIUM CHLORIDE 0.9 % IV SOLN
INTRAVENOUS | Status: DC | PRN
  Administered 2019-06-01: 19:00:00 via INTRAVENOUS

## 2019-06-01 MED ORDER — FLEET ENEMA 7-19 GM/118ML RE ENEM: 1.0000 | ENEMA | Freq: Every day | RECTAL | Status: DC | PRN

## 2019-06-01 MED ORDER — OXYTOCIN 40 UNITS IN NORMAL SALINE INFUSION - SIMPLE MED
INTRAVENOUS | Status: AC
  Filled 2019-06-01: qty 1000

## 2019-06-01 SURGICAL SUPPLY — 33 items
APL SKNCLS STERI-STRIP NONHPOA (GAUZE/BANDAGES/DRESSINGS) ×1
BENZOIN TINCTURE PRP APPL 2/3 (GAUZE/BANDAGES/DRESSINGS) ×2 IMPLANT
CHLORAPREP W/TINT 26ML (MISCELLANEOUS) ×3 IMPLANT
CLAMP CORD UMBIL (MISCELLANEOUS) IMPLANT
CLOSURE WOUND 1/2 X4 (GAUZE/BANDAGES/DRESSINGS) ×1
CLOTH BEACON ORANGE TIMEOUT ST (SAFETY) ×3 IMPLANT
DRSG OPSITE POSTOP 4X10 (GAUZE/BANDAGES/DRESSINGS) ×3 IMPLANT
ELECT REM PT RETURN 9FT ADLT (ELECTROSURGICAL) ×3
ELECTRODE REM PT RTRN 9FT ADLT (ELECTROSURGICAL) ×1 IMPLANT
EXTRACTOR VACUUM M CUP 4 TUBE (SUCTIONS) IMPLANT
EXTRACTOR VACUUM M CUP 4' TUBE (SUCTIONS)
GLOVE BIOGEL PI IND STRL 7.0 (GLOVE) ×1 IMPLANT
GLOVE BIOGEL PI INDICATOR 7.0 (GLOVE) ×2
GLOVE ECLIPSE 7.0 STRL STRAW (GLOVE) ×6 IMPLANT
GOWN STRL REUS W/TWL LRG LVL3 (GOWN DISPOSABLE) ×6 IMPLANT
HOVERMATT SINGLE USE (MISCELLANEOUS) ×2 IMPLANT
KIT ABG SYR 3ML LUER SLIP (SYRINGE) IMPLANT
NDL HYPO 25X5/8 SAFETYGLIDE (NEEDLE) IMPLANT
NEEDLE HYPO 25X5/8 SAFETYGLIDE (NEEDLE) IMPLANT
NS IRRIG 1000ML POUR BTL (IV SOLUTION) ×3 IMPLANT
PACK C SECTION WH (CUSTOM PROCEDURE TRAY) ×3 IMPLANT
PAD OB MATERNITY 4.3X12.25 (PERSONAL CARE ITEMS) ×3 IMPLANT
RETRACTOR TRAXI PANNICULUS (MISCELLANEOUS) IMPLANT
STRIP CLOSURE SKIN 1/2X4 (GAUZE/BANDAGES/DRESSINGS) ×1 IMPLANT
SUT MNCRL 0 VIOLET CTX 36 (SUTURE) ×3 IMPLANT
SUT MON AB 2-0 CT1 27 (SUTURE) ×6 IMPLANT
SUT MONOCRYL 0 CTX 36 (SUTURE) ×6
SUT PLAIN 0 NONE (SUTURE) IMPLANT
SUT PLAIN 2 0 XLH (SUTURE) ×2 IMPLANT
TOWEL OR 17X24 6PK STRL BLUE (TOWEL DISPOSABLE) ×3 IMPLANT
TRAXI PANNICULUS RETRACTOR (MISCELLANEOUS) ×2
TRAY FOLEY W/BAG SLVR 14FR LF (SET/KITS/TRAYS/PACK) IMPLANT
WATER STERILE IRR 1000ML POUR (IV SOLUTION) ×3 IMPLANT

## 2019-06-01 NOTE — Anesthesia Procedure Notes (Signed)
Spinal  Patient location during procedure: OR Start time: 06/01/2019 6:23 PM End time: 06/01/2019 6:33 PM Staffing Anesthesiologist: Freddrick March, MD Performed: anesthesiologist  Preanesthetic Checklist Completed: patient identified, surgical consent, pre-op evaluation, timeout performed, IV checked, risks and benefits discussed and monitors and equipment checked Spinal Block Patient position: sitting Prep: site prepped and draped and DuraPrep Patient monitoring: cardiac monitor, continuous pulse ox and blood pressure Approach: midline Location: L3-4 Injection technique: single-shot Needle Needle type: Pencan  Needle gauge: 24 G Needle length: 9 cm Assessment Sensory level: T6 Additional Notes Functioning IV was confirmed and monitors were applied. Sterile prep and drape, including hand hygiene and sterile gloves were used. The patient was positioned and the spine was prepped. The skin was anesthetized with lidocaine.  Free flow of clear CSF was obtained prior to injecting local anesthetic into the CSF.  The spinal needle aspirated freely following injection.  The needle was carefully withdrawn.  The patient tolerated the procedure well.

## 2019-06-01 NOTE — Transfer of Care (Signed)
Immediate Anesthesia Transfer of Care Note  Patient: Marisa Campbell  Procedure(s) Performed: CESAREAN SECTION (N/A Abdomen)  Patient Location: PACU  Anesthesia Type:Spinal  Level of Consciousness: awake  Airway & Oxygen Therapy: Patient Spontanous Breathing  Post-op Assessment: Report given to RN and Post -op Vital signs reviewed and stable  Post vital signs: stable  Last Vitals:  Vitals Value Taken Time  BP    Temp    Pulse    Resp 14 06/01/19 1946  SpO2    Vitals shown include unvalidated device data.  Last Pain:  Vitals:   06/01/19 1630  TempSrc: Oral  PainSc: 6          Complications: No apparent anesthesia complications

## 2019-06-01 NOTE — Progress Notes (Signed)
Contacted by nursing service stating that she felt a fetal limb in the vagina. This was confirmed. On u/s the fetus is now breech . Head is in the RUQ. There is minimal fluid. Given the lack of fluid, the maternal adipose tissue, and the placental location it is unlikely that an external version will be successful. I attempted to move the fetus without success. Discussed with pt the need for a C/S . Will prepare and go to the operating room. Fhts are reactive

## 2019-06-01 NOTE — Op Note (Signed)
Marisa Campbell, Marisa Campbell. MEDICAL RECORD KP:[id number] ACCOUNT [id number] DATE OF BIRTH:[birth year] FACILITY: [facility] LOCATION: [facility] PHYSICIAN:MARK Kathline Magic, MD  OPERATIVE REPORT  DATE OF PROCEDURE:  06/01/2019  PREOPERATIVE DIAGNOSES:   1.  Intrauterine pregnancy at 69 weeks estimated gestational age. 2.  Isoimmunization of anti-Ec. 3.  Hypothyroidism. 4.  History of COVID-19 infection. 5.  Elevated Dopplers. 6.  Breech presentation. 7.  History of prior cesarean section.  POSTOPERATIVE DIAGNOSES: 1.  Intrauterine pregnancy at 71 weeks estimated gestational age. 2.  Isoimmunization of anti-Ec. 3.  Hypothyroidism. 4.  History of COVID-19 infection. 5.  Elevated Dopplers. 6.  Breech presentation. 7.  History of prior cesarean section.  PROCEDURE:  Repeat low transverse cesarean section.  SURGEON:  Freda Munro, MD   ANESTHESIA:  Spinal.  ANTIBIOTICS:  Ancef 3 g.  DRAINS:  Foley, bedside drainage.  ESTIMATED BLOOD LOSS:  900 mL.  COMPLICATIONS:  None.  FINDINGS:  There was 1 live viable female infant in the footling breech presentation.  There were minimal adhesions in the lower uterine segment.  DESCRIPTION OF PROCEDURE:  The patient was taken to the operating room where a spinal anesthetic was administered without difficulty.  She was then placed in the dorsal supine position with left lateral tilt.  She was prepped and draped in the usual  fashion for this procedure.  A Pfannenstiel incision was made through the previous scar.  On entering the abdominal cavity, the Alexis retractor was placed.  Bladder flap was taken down sharply.  Low transverse uterine incision was made in the midline  and extended laterally with blunt dissection.  The amniotic fluid was clear.  The feet were then grasped and the baby delivered to the thorax.  The arms were then delivered, followed by the head.  After this, the cord was doubly clamped and cut and the  infant handed to the  awaiting NICU team.  Cord blood was obtained.  The placenta was manually removed.  The uterus was wiped with a wet lap and examined.  No evidence of products of conception were seen.  The uterine incision was closed using 0 Monocryl  suture in a running locking fashion.  A single layer was used.  There was one area of bleeding on the right corner.  This was made hemostatic with an interrupted 0 Monocryl suture in figure-of-eight fashion.  Bladder flap was not repaired.  The parietal  peritoneum and rectus muscles were reapproximated in the midline using 2-0 Monocryl in a running fashion.  The fascia was closed using 0 Monocryl suture in a running fashion.  Subcuticular tissue was irrigated and made hemostatic with the Bovie.   Subcuticular tissue was then closed with interrupted 2-0 plain gut suture.  3-0 Vicryl suture was then placed in a subcuticular fashion.  Steri-Strips were placed.  The patient was taken to the recovery room in stable condition.  Instrument and lap  counts were correct x3.  The baby was taken to the newborn nursery.  VN/NUANCE  D:06/01/2019 T:06/01/2019 JOB:[id number]

## 2019-06-01 NOTE — Anesthesia Preprocedure Evaluation (Signed)
Anesthesia Evaluation  Patient identified by MRN, date of birth, ID band  Reviewed: Allergy & Precautions, NPO status , Patient's Chart, lab work & pertinent test results  Airway Mallampati: III  TM Distance: >3 FB Neck ROM: Full    Dental no notable dental hx.    Pulmonary neg pulmonary ROS,  COVID positive 7/20   Pulmonary exam normal breath sounds clear to auscultation       Cardiovascular negative cardio ROS Normal cardiovascular exam Rhythm:Regular Rate:Normal     Neuro/Psych negative neurological ROS  negative psych ROS   GI/Hepatic negative GI ROS, Neg liver ROS,   Endo/Other  Hypothyroidism Morbid obesity  Renal/GU negative Renal ROS  negative genitourinary   Musculoskeletal negative musculoskeletal ROS (+)   Abdominal   Peds  Hematology negative hematology ROS (+) Anti E and Anti c antibodies, 2 units crossmatched   Anesthesia Other Findings   Reproductive/Obstetrics (+) Pregnancy                             Anesthesia Physical Anesthesia Plan  ASA: III  Anesthesia Plan: Spinal   Post-op Pain Management:    Induction:   PONV Risk Score and Plan: Treatment may vary due to age or medical condition  Airway Management Planned: Natural Airway  Additional Equipment:   Intra-op Plan:   Post-operative Plan:   Informed Consent: I have reviewed the patients History and Physical, chart, labs and discussed the procedure including the risks, benefits and alternatives for the proposed anesthesia with the patient or authorized representative who has indicated his/her understanding and acceptance.     Dental advisory given  Plan Discussed with: CRNA  Anesthesia Plan Comments:         Anesthesia Quick Evaluation

## 2019-06-01 NOTE — MAU Note (Signed)
Asymptomatic, swab collected. 

## 2019-06-01 NOTE — H&P (Signed)
Marisa Campbell is an 38 y.o. [phone] [redacted]w[redacted]d female who was set up for induction by Dr. Philis Pique at the suggestion of MFM. Pt's pregnancy has been complicated by AMA/ isoimmunizationwith AntiE and Anti c./ Hypothyroidism/Covid 19 infection 7/20.Marland Kitchen She is reported to have abnl dopplers recently. Pt declined genetic testing. She had a nl OGTT. She has not had a GBS test  Past Medical History:  Diagnosis Date  . Anemia   . COVID-19 01/31/2019  . Headache   . 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 Hypothyroidism    on meds currently  . Vaginal Pap smear, abnormal     Past Surgical History:  Procedure Laterality Date  . CESAREAN SECTION      Family History  Problem Relation Age of Onset  . Hypertension Father    Social History:  reports that she has never smoked. She has never used smokeless tobacco. She reports that she does not drink alcohol or use drugs.  Allergies: No Known Allergies  Medications Prior to Admission  Medication Sig Dispense Refill  . aspirin EC 81 MG tablet Take 81 mg by mouth daily.    . butalbital-acetaminophen-caffeine (FIORICET) 50-325-40 MG tablet Take 1 tablet by mouth every 4 (four) hours as needed for headache.    . calcium carbonate (TUMS - DOSED IN MG ELEMENTAL CALCIUM) 500 MG chewable tablet Chew 2 tablets by mouth as needed for indigestion or heartburn.    Mariane Baumgarten Sodium (COLACE PO) Take 100-200 mg by mouth daily as needed (constipation).     Arna Medici 125 MCG tablet Take 125 mcg by mouth daily.    Riley Nearing w/o A FeCbnFeGlu-FA &B6 (CITRANATAL B-CALM) 20-1 MG & 2 x 25 MG MISC Take 3 tablets by mouth daily.    . valACYclovir (VALTREX) 1000 MG tablet Take 1,000 mg by mouth daily. As needed for repeat cold sore         Blood pressure 117/65, pulse 67, temperature 98.3 F (36.8 C), temperature source Oral, resp. rate 18, height 5\' 2"  (1.575 m), weight 123.4 kg, last menstrual period 09/21/2018, unknown if currently  breastfeeding. General appearance: alert, cooperative and moderately obese Abdomen: gravid Cx- 5-/3/-2 vtx  Lab Results  Component Value Date   WBC 8.9 06/01/2019   HGB 12.0 06/01/2019   HCT 34.6 (L) 06/01/2019   MCV 88.0 06/01/2019   PLT 187 06/01/2019   No results found for: PREGTESTUR, PREGSERUM, HCG, HCGQUANT    Patient Active Problem List   Diagnosis Date Noted  . Indication for care in labor or delivery 06/01/2019  . 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 Third trimester bleeding, antepartum 05/15/2019  . Vaginal bleeding in pregnancy, third trimester 05/14/2019  . [redacted] weeks gestation of pregnancy   . COVID-19 virus infection 02/01/2019  . Isoimmunization from blood group incompatibility during pregnancy in first trimester 05/13/2017   IMP/ IUP with abnl dopplers         Isoimmunization         History of covid 19 this preg         ama         Hypothyroidism        Unknown GBS status Plan/ Admit          Start induction          Start PCN  [place] E 06/01/2019, 2:07 PM

## 2019-06-02 ENCOUNTER — Encounter (HOSPITAL_COMMUNITY): Payer: Self-pay | Admitting: Obstetrics and Gynecology

## 2019-06-02 LAB — CBC
HCT: 29.1 % — ABNORMAL LOW (ref 36.0–46.0)
Hemoglobin: 10.3 g/dL — ABNORMAL LOW (ref 12.0–15.0)
MCH: 31 pg (ref 26.0–34.0)
MCHC: 35.4 g/dL (ref 30.0–36.0)
MCV: 87.7 fL (ref 80.0–100.0)
Platelets: 171 10*3/uL (ref 150–400)
RBC: 3.32 MIL/uL — ABNORMAL LOW (ref 3.87–5.11)
RDW: 13.2 % (ref 11.5–15.5)
WBC: 12.9 10*3/uL — ABNORMAL HIGH (ref 4.0–10.5)
nRBC: 0 % (ref 0.0–0.2)

## 2019-06-02 LAB — RPR: RPR Ser Ql: NONREACTIVE

## 2019-06-02 MED ORDER — IBUPROFEN 600 MG PO TABS
600.0000 mg | ORAL_TABLET | Freq: Four times a day (QID) | ORAL | Status: DC | PRN
  Administered 2019-06-02 – 2019-06-04 (×7): 600 mg via ORAL
  Filled 2019-06-02 (×8): qty 1

## 2019-06-02 MED ORDER — MENTHOL 3 MG MT LOZG
1.0000 | LOZENGE | OROMUCOSAL | Status: DC | PRN
  Administered 2019-06-02: 3 mg via ORAL
  Filled 2019-06-02: qty 9

## 2019-06-02 NOTE — Anesthesia Postprocedure Evaluation (Signed)
Anesthesia Post Note  Patient: Marisa Campbell  Procedure(s) Performed: CESAREAN SECTION (N/A Abdomen)     Patient location during evaluation: PACU Anesthesia Type: Spinal Level of consciousness: oriented and awake and alert Pain management: pain level controlled Vital Signs Assessment: post-procedure vital signs reviewed and stable Respiratory status: spontaneous breathing, respiratory function stable and patient connected to nasal cannula oxygen Cardiovascular status: blood pressure returned to baseline and stable Postop Assessment: no headache, no backache and no apparent nausea or vomiting Anesthetic complications: no    Last Vitals:  Vitals:   06/02/19 0100 06/02/19 0200  BP: 101/65   Pulse: 66   Resp: 16 16  Temp: 36.6 C 36.9 C  SpO2: 97% 97%    Last Pain:  Vitals:   06/02/19 0200  TempSrc: Oral  PainSc: 0-No pain   Pain Goal:                   Chelsey L Woodrum

## 2019-06-02 NOTE — Progress Notes (Signed)
Patient is doing well.  She is tolerating PO, ambulating.  Foley catheter still in place.  Pain is controlled.  Lochia is appropriate  Vitals:   06/02/19 0000 06/02/19 0100 06/02/19 0200 06/02/19 0600  BP: 102/64 101/65  (!) 93/46  Pulse: 67 66  78  Resp: 16 16 16 16   Temp: 97.9 F (36.6 C) 97.8 F (36.6 C) 98.5 F (36.9 C) 97.8 F (36.6 C)  TempSrc: Oral Oral Oral Oral  SpO2: 97% 97% 97% 96%  Weight:      Height:        NAD Abdomen:  soft, appropriate tenderness, pressure dressing in place GU: foley in situ ext:    Symmetric, trace edema bilaterally, +SCDs  Lab Results  Component Value Date   WBC 12.9 (H) 06/02/2019   HGB 10.3 (L) 06/02/2019   HCT 29.1 (L) 06/02/2019   MCV 87.7 06/02/2019   PLT 171 06/02/2019    --/--/AB POS (11/24 0940)/RImmune  A/P    38 y.o. [id number] POD 1 s/p RCS for breech presentation / isoimmunization and elevated MCA dopplers at 36 weeks Routine post op and postpartum care.   Remove foley catheter this AM Remove pressure dressing

## 2019-06-02 NOTE — Lactation Note (Signed)
This note was copied from a baby's chart. Lactation Consultation Note  Patient Name: Girl Inioluwa Boulay ERDEY'C Date: 06/02/2019 Reason for consult: Initial assessment;Late-preterm 34-36.6wks;Maternal endocrine disorder Type of Endocrine Disorder?: Thyroid P5.  Mom breastfed her previous babies.  Newborn is 36.1 weeks and 15 hours old.  Baby has been latching to left breast and supplemented with neosure 22 calorie with slow flow nipple.  Right nipple is dimpled and baby has not latched on that side.  Discussed late preterm feeding behavior.  Explained that it may take infant to be closer to term before good milk transfer occurs.  Recommended initiating pumping for good breast stimulation.  Mom has hand expressed but no colostrum seen.  She does not have a pump at home.  She is active with [facility] in [facility].  Referral faxed to [facility] office.  Encouraged to call for assist prn.  Maternal Data Has patient been taught Hand Expression?: Yes Does the patient have breastfeeding experience prior to this delivery?: Yes  Feeding    LATCH Score                   Interventions Interventions: DEBP  Lactation Tools Discussed/Used WIC Program: Yes Pump Review: Setup, frequency, and cleaning;Milk Storage Initiated by:: LMOULDEN Date initiated:: 06/02/19   Consult Status Consult Status: Follow-up Date: 06/03/19 Follow-up type: In-patient    [name] 06/02/2019, 10:00 AM

## 2019-06-03 MED ORDER — IBUPROFEN 600 MG PO TABS: 600.0000 mg | ORAL_TABLET | Freq: Four times a day (QID) | ORAL | 0 refills | Status: DC | PRN

## 2019-06-03 MED ORDER — OXYCODONE-ACETAMINOPHEN 5-325 MG PO TABS: 1.0000 | ORAL_TABLET | Freq: Four times a day (QID) | ORAL | 0 refills | Status: DC | PRN

## 2019-06-03 NOTE — Lactation Note (Signed)
This note was copied from a baby's chart. Lactation Consultation Note  Patient Name: Marisa Campbell LAGTX'M Date: 06/03/2019 Reason for consult: Follow-up assessment;Late-preterm 34-36.6wks Baby is 41 hours old/3% weight loss.  Mom is currently pumping and obtaining a few mls of colostrum.  She states baby is doing better latching to breast.  Baby continues to receive formula supplementation.  No questions or concerns.  Encouraged to call prn.  Maternal Data    Feeding Feeding Type: Formula Nipple Type: Slow - flow  LATCH Score                   Interventions    Lactation Tools Discussed/Used     Consult Status Consult Status: Follow-up Date: 06/04/19 Follow-up type: In-patient    [name] 06/03/2019, 12:00 PM

## 2019-06-04 MED ORDER — FERROUS SULFATE 325 (65 FE) MG PO TABS: 325.0000 mg | ORAL_TABLET | Freq: Every day | ORAL | 11 refills | Status: DC

## 2019-06-04 MED ORDER — ACETAMINOPHEN 325 MG PO TABS
650.0000 mg | ORAL_TABLET | Freq: Four times a day (QID) | ORAL | Status: DC | PRN
  Administered 2019-06-04: 650 mg via ORAL
  Filled 2019-06-04: qty 2

## 2019-06-04 MED ORDER — OXYCODONE-ACETAMINOPHEN 5-325 MG PO TABS: 1.0000 | ORAL_TABLET | Freq: Four times a day (QID) | ORAL | 0 refills | Status: DC | PRN

## 2019-06-04 MED ORDER — DOCUSATE SODIUM 50 MG PO CAPS: 50.0000 mg | ORAL_CAPSULE | Freq: Two times a day (BID) | ORAL | 0 refills | Status: DC

## 2019-06-04 MED ORDER — IBUPROFEN 600 MG PO TABS: 600.0000 mg | ORAL_TABLET | Freq: Four times a day (QID) | ORAL | 0 refills | Status: DC | PRN

## 2019-06-04 NOTE — Progress Notes (Signed)
Subjective: Postpartum Day 3: Cesarean Delivery Patient reports doing well, tolerating PO, pain appropriately controlled, ambulating, voiding.    Objective: Vital signs in last 24 hours: Temp:  [98.7 F (37.1 C)-98.8 F (37.1 C)] 98.8 F (37.1 C) (11/27 0500) Pulse Rate:  [67-74] 67 (11/27 0500) Resp:  [18] 18 (11/27 0500) BP: (114-130)/(63-88) 130/88 (11/27 0500) SpO2:  [95 %] 95 % (11/27 0500)  Physical Exam:  General: alert and no distress Lochia: appropriate Uterine Fundus: firm Incision: dressing clean and dry DVT Evaluation: No evidence of DVT seen on physical exam.  Recent Labs    06/01/19 0950 06/02/19 0447  HGB 12.0 10.3*  HCT 34.6* 29.1*    Assessment/Plan: Status post Cesarean section. Doing well postoperatively.  Discharge home with standard precautions and return to clinic in 4-6 weeks.  STACY SAILER 38 y.o. [id number] PPD#3 sp rCS 2/2 breech 1. POC: Meeting postop goals 2. Hypothyroidism: cont syn 163mcg 3. H/o COVID infection in July 2020 4. Obesity BMI 49.75. No additional risk factors for DVT, recommend ambulation.  Rubella immune, AB POS, breastfeeding, baby with mom  Makya Yurko K Taam-Akelman 06/04/2019, 8:52 AM

## 2019-06-04 NOTE — Lactation Note (Signed)
This note was copied from a baby's chart. Lactation Consultation Note  Patient Name: Marisa Campbell IOXBD'Z Date: 06/04/2019 Reason for consult: Follow-up assessment;Late-preterm 34-36.6wks Baby is 63 hours old/2% weight loss.  Mom continues to breastfeed with cues and supplements with expressed milk/formula.  Mom is currently pumping and has obtained 25 mls from each breast.  Breasts are filling but not engorged.  Discussed [place] with mom but she would like to use a manual pump until she can obtain one from [facility].  Referral was sent but WIC is closed for the holiday.  Reviewed outpatient services and encouraged to call prn.  Maternal Data    Feeding    LATCH Score                   Interventions    Lactation Tools Discussed/Used     Consult Status Consult Status: Complete Follow-up type: Call as needed    [name] 06/04/2019, 10:28 AM

## 2019-06-04 NOTE — Discharge Summary (Signed)
OB Discharge Summary     Patient Name: Marisa Campbell DOB: [birth year] MRN: [id number]  Date of admission: 06/01/2019 Delivering MD: Malva Limes E   Date of discharge: 06/04/2019  Admitting diagnosis: pregnancy Intrauterine pregnancy: [redacted]w[redacted]d     Secondary diagnosis:  Active Problems:   Indication for care in labor or delivery   Pregnancy Abnormal dopplers Isoimmunization History of covid 19 this preg ama Hypothyroidism      Discharge diagnosis: Preterm Pregnancy Delivered  Via CS                                                                                              Post partum procedures:none  Augmentation: AROM and Pitocin  Complications: None  Hospital course:  Induction of Labor With Cesarean Section  38 y.o. yo [phone] at [redacted]w[redacted]d was admitted to the hospital 06/01/2019 for induction of labor due to abnormal dopplers in setting of isoimmunization. Patient had a labor course significant for SVE was 2/50/-3, augmented with pitocin and AROM, was then found to be malpresented and not a candidate for ECV. The patient went for cesarean section due to Malpresentation, and delivered a Viable infant,06/01/2019  Membrane Rupture Time/Date: 10:52 AM ,06/01/2019   Details of operation can be found in separate operative Note.  Patient had an uncomplicated postpartum course. She was continued on synthroid at discharge. She is ambulating, tolerating a regular diet, passing flatus, and urinating well.  Patient is discharged home in stable condition on 06/04/19.                                    Physical exam  Vitals:   06/02/19 2135 06/03/19 0529 06/03/19 2337 06/04/19 0500  BP: (!) 107/56 (!) 97/56 114/63 130/88  Pulse: 61 (!) 54 74 67  Resp: 18 18 18 18   Temp: 98 F (36.7 C) 98.1 F (36.7 C) 98.7 F (37.1 C) 98.8 F (37.1 C)  TempSrc: Oral Oral Oral Oral  SpO2: 98% 99%  95%  Weight:      Height:       General: alert and no distress Lochia:  appropriate Uterine Fundus: firm Incision: dressing clean and dry DVT Evaluation: No evidence of DVT seen on physical exam. Labs: Lab Results  Component Value Date   WBC 12.9 (H) 06/02/2019   HGB 10.3 (L) 06/02/2019   HCT 29.1 (L) 06/02/2019   MCV 87.7 06/02/2019   PLT 171 06/02/2019   CMP Latest Ref Rng & Units 01/31/2019  Glucose 70 - 99 mg/dL 02/02/2019)  BUN 6 - 20 mg/dL 5(L)  Creatinine 703(J - 1.00 mg/dL 0.09  Sodium 3.81 - 829 mmol/L 134(L)  Potassium 3.5 - 5.1 mmol/L 3.5  Chloride 98 - 111 mmol/L 105  CO2 22 - 32 mmol/L 22  Calcium 8.9 - 10.3 mg/dL 8.3(L)  Total Protein 6.5 - 8.1 g/dL 6.5  Total Bilirubin 0.3 - 1.2 mg/dL 0.4  Alkaline Phos 38 - 126 U/L 57  AST 15 - 41 U/L 17  ALT 0 - 44 U/L 16  Discharge instruction: per After Visit Summary and "Baby and Me Booklet".  After visit meds:  Allergies as of 06/04/2019   No Known Allergies     Medication List    TAKE these medications   docusate sodium 50 MG capsule Commonly known as: COLACE Take 1 capsule (50 mg total) by mouth 2 (two) times daily.   Euthyrox 125 MCG tablet Generic drug: levothyroxine Take 125 mcg by mouth daily.   ferrous sulfate 325 (65 FE) MG tablet Commonly known as: FerrouSul Take 1 tablet (325 mg total) by mouth daily with breakfast.   ibuprofen 600 MG tablet Commonly known as: ADVIL Take 1 tablet (600 mg total) by mouth every 6 (six) hours as needed.   oxyCODONE-acetaminophen 5-325 MG tablet Commonly known as: PERCOCET/ROXICET Take 1-2 tablets by mouth every 6 (six) hours as needed for severe pain.            Discharge Care Instructions  (From admission, onward)         Start     Ordered   06/03/19 0000  Discharge wound care:    Comments: You may wash incision with soap and water.  Do not soak or submerge the incision for 2 weeks. Keep incision dry. You may need to keep a sanitary pad or panty liner between the incision and your clothing for comfort and to keep the incision  dry. If you note drainage, increased pain, or increased redness of the incision, then please notify your physician.   06/03/19 1123          Diet: routine diet  Activity: Advance as tolerated. Pelvic rest for 6 weeks.   Outpatient follow up:4 weeks Follow up Appt:No future appointments. Follow up Visit:No follow-ups on file.  Postpartum contraception: defer to postpartum visit  Newborn Data: Live born female  Birth Weight: 6 lb 6.5 oz (2905 g) APGAR: 55, 9  Newborn Delivery   Birth date/time: 06/01/2019 18:57:00 Delivery type: C-Section, Low Transverse Trial of labor: Yes C-section categorization: Repeat      Baby Feeding: Breast Disposition:home with mother   06/04/2019 Jonelle Sidle, MD

## 2019-06-04 NOTE — Discharge Instructions (Signed)

## 2019-06-05 LAB — TYPE AND SCREEN
ABO/RH(D): AB POS
Antibody Screen: POSITIVE
Donor AG Type: NEGATIVE
Donor AG Type: NEGATIVE
Unit division: 0
Unit division: 0

## 2019-06-05 LAB — BPAM RBC
Blood Product Expiration Date: 202012152359
Blood Product Expiration Date: 202012172359
Unit Type and Rh: 5100
Unit Type and Rh: 6200

## 2019-06-07 ENCOUNTER — Ambulatory Visit (HOSPITAL_COMMUNITY): Payer: Medicaid Other

## 2019-06-07 ENCOUNTER — Ambulatory Visit (HOSPITAL_COMMUNITY)
Admission: RE | Admit: 2019-06-07 | Discharge: 2019-06-07 | Disposition: A | Discharge status: Home / Self Care | Payer: Medicaid Other | Source: Ambulatory Visit | Attending: Obstetrics and Gynecology | Admitting: Obstetrics and Gynecology

## 2019-08-12 ENCOUNTER — Telehealth: Payer: Self-pay | Admitting: Internal Medicine

## 2019-08-12 NOTE — Telephone Encounter (Signed)
Called and confirmed patients phone visit tomorrow 08/13/2019

## 2019-08-13 ENCOUNTER — Encounter: Payer: Self-pay | Admitting: Internal Medicine

## 2019-08-13 ENCOUNTER — Ambulatory Visit (INDEPENDENT_AMBULATORY_CARE_PROVIDER_SITE_OTHER): Payer: Medicaid Other | Admitting: Internal Medicine

## 2019-08-13 DIAGNOSIS — E039 Hypothyroidism, unspecified: Secondary | ICD-10-CM | POA: Insufficient documentation

## 2019-08-13 DIAGNOSIS — Z7689 Persons encountering health services in other specified circumstances: Secondary | ICD-10-CM | POA: Diagnosis not present

## 2019-08-13 NOTE — Progress Notes (Signed)
Virtual Visit via Telephone Note  I connected with Marisa Campbell, on 08/13/2019 at 9:49 AM by telephone due to the COVID-19 pandemic and verified that I am speaking with the correct person using two identifiers.   Consent: I discussed the limitations, risks, security and privacy concerns of performing an evaluation and management service by telephone and the availability of in person appointments. I also discussed with the patient that there may be a patient responsible charge related to this service. The patient expressed understanding and agreed to proceed.   Location of Patient: Home   Location of Provider: Clinic    Persons participating in Telemedicine visit: [facility] Dr. Earlene Plater      History of Present Illness: Patient has a visit to establish care. Patient has a history of hypothyroidism. Reports that whenever she is pregnant her thyroid gets out of whack. Patient had a child at end of Nov 2020. She doesn't like taking Synthroid because she feels like it doesn't control her thyroid. She used to take Naturethroid and feels like that worked better for her.    Past Medical History:  Diagnosis Date  . Anemia   . COVID-19 01/31/2019  . Headache   . 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 Hypothyroidism    on meds currently  . Vaginal Pap smear, abnormal    No Known Allergies  Current Outpatient Medications on File Prior to Visit  Medication Sig Dispense Refill  . EUTHYROX 125 MCG tablet Take 125 mcg by mouth daily.     No current facility-administered medications on file prior to visit.    Observations/Objective: NAD. Speaking clearly.  Work of breathing normal.  Alert and oriented. Mood appropriate.   Assessment and Plan: 1. Encounter to establish care  2. Hypothyroidism, unspecified type Discussed with patient that American Academy of Endo does not recommend use of disiccated thyroid hormone for  replacement and that I would prefer her to be on Levothyroixine especially since she is breast feeding. She is agreeable to trying Levothyroxine. Discussed that pregnancy does impact thyroid so not surprised by recent changes in levels and symptoms. She would like to obtain labs prior to re-prescribing. She has been without medication for <1 week.  - VEL+[id number]; Future   Follow Up Instructions: Return today for lab work    I discussed the assessment and treatment plan with the patient. The patient was provided an opportunity to ask questions and all were answered. The patient agreed with the plan and demonstrated an understanding of the instructions.   The patient was advised to call back or seek an in-person evaluation if the symptoms worsen or if the condition fails to improve as anticipated.     I provided 16 minutes total of non-face-to-face time during this encounter including median intraservice time, reviewing previous notes, investigations, ordering medications, medical decision making, coordinating care and patient verbalized understanding at the end of the visit.    Marcy Siren, D.O. Primary Care at [facility]  08/13/2019, 9:49 AM

## 2019-08-13 NOTE — Addendum Note (Signed)
Addended by: Heidi Dach on: 08/13/2019 02:19 PM   Modules accepted: Orders

## 2019-08-14 LAB — TSH+T4F+T3FREE
Free T4: 0.84 ng/dL (ref 0.82–1.77)
T3, Free: 2.1 pg/mL (ref 2.0–4.4)
TSH: 0.523 u[IU]/mL (ref 0.450–4.500)

## 2019-08-16 ENCOUNTER — Other Ambulatory Visit: Payer: Self-pay | Admitting: Internal Medicine

## 2019-08-16 DIAGNOSIS — E039 Hypothyroidism, unspecified: Secondary | ICD-10-CM

## 2019-08-16 MED ORDER — LEVOTHYROXINE SODIUM 150 MCG PO TABS: 150.0000 ug | ORAL_TABLET | Freq: Every day | ORAL | 0 refills | Status: DC

## 2019-08-16 NOTE — Progress Notes (Signed)
Patient notified of results & recommendations. Expressed understanding.  Made a lab appointment on 09/15/2019 @ 9 AM.

## 2019-09-15 ENCOUNTER — Other Ambulatory Visit: Payer: Medicaid Other

## 2019-11-01 ENCOUNTER — Other Ambulatory Visit (INDEPENDENT_AMBULATORY_CARE_PROVIDER_SITE_OTHER): Payer: Medicaid Other

## 2019-11-01 ENCOUNTER — Other Ambulatory Visit: Payer: Self-pay

## 2019-11-01 DIAGNOSIS — E039 Hypothyroidism, unspecified: Secondary | ICD-10-CM | POA: Diagnosis not present

## 2019-11-02 ENCOUNTER — Other Ambulatory Visit: Payer: Self-pay | Admitting: Internal Medicine

## 2019-11-02 DIAGNOSIS — E039 Hypothyroidism, unspecified: Secondary | ICD-10-CM

## 2019-11-02 LAB — THYROID PANEL WITH TSH
Free Thyroxine Index: 2.2 (ref 1.2–4.9)
T3 Uptake Ratio: 29 % (ref 24–39)
T4, Total: 7.7 ug/dL (ref 4.5–12.0)
TSH: 0.101 u[IU]/mL — ABNORMAL LOW (ref 0.450–4.500)

## 2019-11-02 MED ORDER — LEVOTHYROXINE SODIUM 125 MCG PO TABS: 125.0000 ug | ORAL_TABLET | Freq: Every day | ORAL | 0 refills | Status: DC

## 2019-11-04 ENCOUNTER — Ambulatory Visit: Payer: Medicaid Other | Admitting: Internal Medicine

## 2019-11-04 NOTE — Progress Notes (Signed)
Patient notified of results & recommendations. Expressed understanding. Scheduled lab appointment for 12/16/2019

## 2019-12-06 ENCOUNTER — Ambulatory Visit
Admission: EM | Admit: 2019-12-06 | Discharge: 2019-12-06 | Disposition: A | Discharge status: Home / Self Care | Payer: Medicaid Other

## 2019-12-06 ENCOUNTER — Encounter: Payer: Self-pay | Admitting: *Deleted

## 2019-12-06 ENCOUNTER — Other Ambulatory Visit: Payer: Self-pay

## 2019-12-06 DIAGNOSIS — M25561 Pain in right knee: Secondary | ICD-10-CM

## 2019-12-06 DIAGNOSIS — M25562 Pain in left knee: Secondary | ICD-10-CM

## 2019-12-06 MED ORDER — KETOROLAC TROMETHAMINE 30 MG/ML IJ SOLN
30.0000 mg | Freq: Once | INTRAMUSCULAR | Status: AC
  Administered 2019-12-06: 30 mg via INTRAMUSCULAR

## 2019-12-06 NOTE — ED Provider Notes (Signed)
[name]    CSN: [id number] Arrival date & time: 12/06/19  1036      History   Chief Complaint Chief Complaint  Patient presents with  . Knee Pain    HPI Marisa Campbell is a 39 y.o. female.   HPI  Bilateral knee pain. Initial injury occurred 1 month ago while walking, she twisted her left knee. Knee pain has waxed and waned since that time. Over the last few weeks, pain has gradually worsened with activity. Weightbearing significantly increase pain and engaging in aerobic type exercise has caused pain to occur in the right. Patient is obese and making efforts to lose weight. She has not had any plain films of her knees completed since injury. She has worn a knee brace for pain management. She is breastfeeding and has not consistently taking anything by mouth. Past Medical History:  Diagnosis Date  . Anemia   . COVID-19 01/31/2019  . Headache   . 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 Hypothyroidism    on meds currently  . Vaginal Pap smear, abnormal     Patient Active Problem List   Diagnosis Date Noted  . Hypothyroidism 08/13/2019  . 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 COVID-19 virus infection 02/01/2019  . CIN I (cervical intraepithelial neoplasia I) 05/24/2016  . Low grade squamous intraepithelial lesion on cytologic smear of cervix (LGSIL) 05/17/2016    Past Surgical History:  Procedure Laterality Date  . CESAREAN SECTION    . CESAREAN SECTION N/A 06/01/2019   Procedure: CESAREAN SECTION;  Surgeon: Olga Millers, MD;  Location: [facility] LD ORS;  Service: Obstetrics;  Laterality: N/A;    OB History    Gravida  7   Para  5   Term  4   Preterm  1   AB  2   Living  5     SAB  1   TAB  1   Ectopic      Multiple  0   Live Births  5            Home Medications    Prior to Admission medications   Medication Sig Start Date End Date Taking? Authorizing Provider  levothyroxine (EUTHYROX) 125  MCG tablet Take 1 tablet (125 mcg total) by mouth daily before breakfast. 11/02/19  Yes Nicolette Bang, DO  Prenatal Vit-Fe Fumarate-FA (PRENATAL VITAMIN PO) Take by mouth.   Yes [provider]    Family History Family History  Problem Relation Age of Onset  . Hypertension Father     Social History Social History   Tobacco Use  . Smoking status: Never Smoker  . Smokeless tobacco: Never Used  Substance Use Topics  . Alcohol use: No  . Drug use: No     Allergies   Patient has no known allergies.   Review of Systems Review of Systems Pertinent negatives listed in HPI Physical Exam Triage Vital Signs ED Triage Vitals [12/06/19 1049]  Enc Vitals Group     BP (!) 144/86     Pulse Rate 60     Resp 18     Temp 97.8 F (36.6 C)     Temp Source Oral     SpO2 96 %     Weight      Height      Head Circumference      Peak Flow      Pain Score 8  Pain Loc      Pain Edu?      Excl. in GC?    No data found.  Updated Vital Signs BP (!) 144/86   Pulse 60   Temp 97.8 F (36.6 C) (Oral)   Resp 18   LMP 11/26/2019 (Exact Date)   SpO2 96%   Breastfeeding Yes   Visual Acuity Right Eye Distance:   Left Eye Distance:   Bilateral Distance:    Right Eye Near:   Left Eye Near:    Bilateral Near:     Physical Exam Constitutional: Patient appears well, cooperative, without distress HENT: Normocephalic, atraumatic, External right and left ear normal.  Neck: Normal ROM. Neck supple.  CVS: RRR, S1/S2 +, no murmurs, no gallops, no carotid bruit.  Pulmonary: Effort and breath sounds normal, no stridor, rhonchi, wheezes, rales. .  Musculoskeletal: bilateral tenderness, mild non pitting edema, full ROM, no visible or palpable effusion.  Neuro: Alert. Normal reflexes, muscle tone coordination. Skin: Skin is warm and dry. No rash noted. Not diaphoretic. Psychiatric: Normal mood and affect. Behavior, judgment, thought content normal.  UC Treatments /  Results  Labs (all labs ordered are listed, but only abnormal results are displayed) Labs Reviewed - No data to display  EKG   Radiology No results found.  Procedures Procedures (including critical care time)  Medications Ordered in UC Medications - No data to display  Initial Impression / Assessment and Plan / UC Course  I have reviewed the triage vital signs and the nursing notes.  Pertinent labs & imaging results that were available during my care of the patient were reviewed by me and considered in my medical decision making (see chart for details).    Acute bilateral knee pain, patient prefers to defer imaging. Recommended starting OTC ibuprofen.  Recommended purchasing Voltaren gel to apply directly to knees. Ice and elevate as needed. Orthopedics if symptoms worsen. Final Clinical Impressions(s) / UC Diagnoses   Final diagnoses:  Acute pain of both knees     Discharge Instructions     Continue Ibuprofen 400 mg every 6 hours as needed. It is okay to add Tylenol 500 mg every 4-6 hours for knee pain. Apply ice at least twice daily as needed when need pain is present. Purchase over the counter Voltaren Gel for knee pain. You may apply up to 4 times per day. Continue knee brace with physical exercise.    ED Prescriptions    None     PDMP not reviewed this encounter.   Bing Neighbors, [place] 12/10/19 [phone]

## 2019-12-06 NOTE — Discharge Instructions (Addendum)
Continue Ibuprofen 400 mg every 6 hours as needed. It is okay to add Tylenol 500 mg every 4-6 hours for knee pain. Apply ice at least twice daily as needed when need pain is present. Purchase over the counter Voltaren Gel for knee pain. You may apply up to 4 times per day. Continue knee brace with physical exercise.

## 2019-12-06 NOTE — ED Triage Notes (Signed)
Reports turning quickly to chase after toddler approx 1 month ago when sudden onset left knee pain.  Since then, pt has had swelling and continued pain, which is relieved somewhat by wearing knee brace.  States now right knee also starting with pain.

## 2019-12-17 ENCOUNTER — Other Ambulatory Visit (INDEPENDENT_AMBULATORY_CARE_PROVIDER_SITE_OTHER): Payer: Medicaid Other

## 2019-12-17 ENCOUNTER — Other Ambulatory Visit: Payer: Self-pay

## 2019-12-17 DIAGNOSIS — E039 Hypothyroidism, unspecified: Secondary | ICD-10-CM

## 2019-12-18 LAB — TSH: TSH: 1.22 u[IU]/mL (ref 0.450–4.500)

## 2019-12-20 NOTE — Progress Notes (Signed)
Patient notified of results & recommendations. Expressed understanding.

## 2020-01-13 ENCOUNTER — Telehealth: Payer: Self-pay

## 2020-01-13 NOTE — Telephone Encounter (Signed)

## 2020-01-14 ENCOUNTER — Encounter: Payer: Self-pay | Admitting: Internal Medicine

## 2020-01-14 ENCOUNTER — Other Ambulatory Visit: Payer: Self-pay

## 2020-01-14 ENCOUNTER — Other Ambulatory Visit (HOSPITAL_COMMUNITY)
Admission: RE | Admit: 2020-01-14 | Discharge: 2020-01-14 | Disposition: A | Discharge status: Home / Self Care | Payer: Medicaid Other | Source: Ambulatory Visit | Attending: Internal Medicine | Admitting: Internal Medicine

## 2020-01-14 ENCOUNTER — Ambulatory Visit (INDEPENDENT_AMBULATORY_CARE_PROVIDER_SITE_OTHER): Payer: Medicaid Other | Admitting: Internal Medicine

## 2020-01-14 VITALS — BP 154/105 | HR 63 | Temp 97.5°F | Resp 17 | Ht 67.0 in | Wt 285.0 lb

## 2020-01-14 DIAGNOSIS — R109 Unspecified abdominal pain: Secondary | ICD-10-CM | POA: Diagnosis present

## 2020-01-14 DIAGNOSIS — R030 Elevated blood-pressure reading, without diagnosis of hypertension: Secondary | ICD-10-CM | POA: Diagnosis not present

## 2020-01-14 LAB — POCT URINE PREGNANCY: Preg Test, Ur: NEGATIVE

## 2020-01-14 NOTE — Progress Notes (Signed)
Subjective:    Marisa Campbell - 39 y.o. female MRN [id number]  Date of birth: [birth year]  HPI  Marisa Campbell is here for concern about abdominal cramps. Located in very low abdominal region. Has noticed for the past couple months has noticed cramping that does not seem to be related to menses. Seems to be almost constant. Last child born at end of Nov. Has had resumption of normal menstrual cycles. No vaginal discharge or spotting between cycles. No diarrhea or constipation. Endorses some nausea but no vomiting. Had mild fevers she attributes to virus that her entire family has had. Says know she isnt pregnant because hasnt had sex in 5 months. Patient reports history of ovarian cysts and does have some pain that she thinks might be related to cysts but says the cramping is different.      Health Maintenance:  Health Maintenance Due  Topic Date Due   Hepatitis C Screening  Never done   COVID-19 Vaccine (1) Never done   TETANUS/TDAP  Never done   PAP SMEAR-Modifier  Never done    -  reports that she has never smoked. She has never used smokeless tobacco. - Review of Systems: Per HPI. 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    Appearance: She is not diaphoretic.  HENT:     Head: Normocephalic and atraumatic.  Eyes:      Conjunctiva/sclera: Conjunctivae normal.  Cardiovascular:     Rate and Rhythm: Normal rate and regular rhythm.     Heart sounds: Normal heart sounds. No murmur heard.   Pulmonary:     Effort: Pulmonary effort is normal. No respiratory distress.     Breath sounds: Normal breath sounds.  Abdominal:     General: Bowel sounds are normal.     Palpations: Abdomen is soft. There is no hepatomegaly or mass.     Tenderness: There is no abdominal tenderness. There is no guarding or rebound. Negative signs include Murphy's sign, Rovsing's sign and McBurney's sign.  Genitourinary:    Comments: Deferred due to presence of child in exam room.  Musculoskeletal:        General: Normal range of motion.  Skin:    General: Skin is warm and dry.  Neurological:     Mental Status: She is alert and oriented to person, place, and time.  Psychiatric:        Mood and Affect: Affect normal.        Judgment: Judgment normal.            Assessment & Plan:   1. Abdominal cramping Patient is well appearing with stable vital signs and has benign abdominal exam. Doubt acute intraabdominal  etiology warranting emergent follow up. Upreg negative. Will obtain vaginal culture and pelvic sono to investigate further. Precautions to seek emergency care discussed.  - US PELVIC COMPLETE WITH TRANSVAGINAL; Future - POCT urine pregnancy - Cervicovaginal ancillary only  2. Elevated blood pressure reading without diagnosis of hypertension BP has been fairly well controlled in the past. Will continue to monitor.   3. Severe obesity (BMI >= 40) (HCC) BMI nearly 45. Discussed appropriate diet with the fact that she is breast feeding and would not recommend medications at present. Will refer to [facility] clinic.  - Lipid panel - Hemoglobin A1c - Amb Ref to Medical Weight Management      Marcy Siren, D.O. 01/14/2020, 10:49 AM Primary Care at [facility]

## 2020-01-15 LAB — LIPID PANEL
Chol/HDL Ratio: 3.2 ratio (ref 0.0–4.4)
Cholesterol, Total: 164 mg/dL (ref 100–199)
HDL: 51 mg/dL (ref >39)
LDL Chol Calc (NIH): 99 mg/dL (ref 0–99)
Triglycerides: 70 mg/dL (ref 0–149)
VLDL Cholesterol Cal: 14 mg/dL (ref 5–40)

## 2020-01-15 LAB — HEMOGLOBIN A1C
Est. average glucose Bld gHb Est-mCnc: 105 mg/dL
Hgb A1c MFr Bld: 5.3 % (ref 4.8–5.6)

## 2020-01-17 LAB — CERVICOVAGINAL ANCILLARY ONLY
Bacterial Vaginitis (gardnerella): NEGATIVE
Candida Glabrata: NEGATIVE
Candida Vaginitis: NEGATIVE
Chlamydia: NEGATIVE
Comment: NEGATIVE
Comment: NEGATIVE
Comment: NEGATIVE
Comment: NEGATIVE
Comment: NEGATIVE
Comment: NORMAL
Neisseria Gonorrhea: NEGATIVE
Trichomonas: NEGATIVE

## 2020-01-18 NOTE — Progress Notes (Signed)
Patient notified of results & recommendations. Expressed understanding.

## 2020-01-21 ENCOUNTER — Ambulatory Visit
Admission: RE | Admit: 2020-01-21 | Discharge: 2020-01-21 | Disposition: A | Discharge status: Home / Self Care | Payer: Medicaid Other | Source: Ambulatory Visit | Attending: Internal Medicine | Admitting: Internal Medicine

## 2020-01-21 ENCOUNTER — Other Ambulatory Visit: Payer: Self-pay

## 2020-01-21 DIAGNOSIS — R109 Unspecified abdominal pain: Secondary | ICD-10-CM

## 2020-02-08 ENCOUNTER — Other Ambulatory Visit: Payer: Self-pay

## 2020-02-08 DIAGNOSIS — E039 Hypothyroidism, unspecified: Secondary | ICD-10-CM

## 2020-02-08 MED ORDER — LEVOTHYROXINE SODIUM 125 MCG PO TABS: 125.0000 ug | ORAL_TABLET | Freq: Every day | ORAL | 1 refills | Status: DC

## 2020-05-25 LAB — OB RESULTS CONSOLE ABO/RH: RH Type: POSITIVE

## 2020-05-25 LAB — OB RESULTS CONSOLE RUBELLA ANTIBODY, IGM: Rubella: IMMUNE

## 2020-05-25 LAB — HIV ANTIBODY (ROUTINE TESTING W REFLEX)

## 2020-05-25 LAB — OB RESULTS CONSOLE ANTIBODY SCREEN: Antibody Screen: POSITIVE

## 2020-05-25 LAB — OB RESULTS CONSOLE HGB/HCT, BLOOD
HCT: 38 (ref 29–41)
Hemoglobin: 12.7

## 2020-05-25 LAB — OB RESULTS CONSOLE RPR: RPR: NONREACTIVE

## 2020-05-25 LAB — HEPATITIS C ANTIBODY: HCV Ab: UNDETERMINED

## 2020-05-25 LAB — OB RESULTS CONSOLE PLATELET COUNT: Platelets: 200

## 2020-05-25 LAB — OB RESULTS CONSOLE GC/CHLAMYDIA
Chlamydia: NEGATIVE
Gonorrhea: NEGATIVE

## 2020-05-25 LAB — OB RESULTS CONSOLE HEPATITIS B SURFACE ANTIGEN: Hepatitis B Surface Ag: NEGATIVE

## 2020-06-12 ENCOUNTER — Ambulatory Visit: Payer: Medicaid Other

## 2020-06-20 ENCOUNTER — Other Ambulatory Visit: Payer: Self-pay | Admitting: Obstetrics and Gynecology

## 2020-06-20 DIAGNOSIS — O09521 Supervision of elderly multigravida, first trimester: Secondary | ICD-10-CM

## 2020-06-27 ENCOUNTER — Ambulatory Visit: Payer: Medicaid Other | Admitting: *Deleted

## 2020-06-27 ENCOUNTER — Ambulatory Visit (HOSPITAL_BASED_OUTPATIENT_CLINIC_OR_DEPARTMENT_OTHER): Payer: Medicaid Other

## 2020-06-27 ENCOUNTER — Other Ambulatory Visit: Payer: Self-pay

## 2020-06-27 ENCOUNTER — Other Ambulatory Visit: Payer: Self-pay | Admitting: *Deleted

## 2020-06-27 ENCOUNTER — Encounter: Payer: Self-pay | Admitting: *Deleted

## 2020-06-27 ENCOUNTER — Ambulatory Visit: Payer: Medicaid Other | Attending: Obstetrics and Gynecology | Admitting: Obstetrics

## 2020-06-27 VITALS — BP 119/66 | HR 61

## 2020-06-27 DIAGNOSIS — Z3A11 11 weeks gestation of pregnancy: Secondary | ICD-10-CM | POA: Diagnosis not present

## 2020-06-27 DIAGNOSIS — O09521 Supervision of elderly multigravida, first trimester: Secondary | ICD-10-CM

## 2020-06-27 DIAGNOSIS — O361110 Maternal care for Anti-A sensitization, first trimester, not applicable or unspecified: Secondary | ICD-10-CM

## 2020-06-27 DIAGNOSIS — O99211 Obesity complicating pregnancy, first trimester: Secondary | ICD-10-CM

## 2020-06-27 DIAGNOSIS — E669 Obesity, unspecified: Secondary | ICD-10-CM

## 2020-06-27 DIAGNOSIS — Z3A12 12 weeks gestation of pregnancy: Secondary | ICD-10-CM

## 2020-06-28 NOTE — Progress Notes (Signed)
MFM Consult Note  This patient was seen in consultation due to advanced maternal age (39 years old) and isoimmunization.  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  The patient had a pregnancy last year in 2020 that was complicated by the same issue.  She already had a consult regarding isoimmunization during that pregnancy.  Her pregnancy has also been complicated by maternal obesity with a BMI of 52.  Her pregnancy history is as follows: G1 2000, 42-week cesarean delivery. G2 2003, full-term VBAC.  She experienced postpartum hemorrhage requiring a blood transfusion.  The patient believes that she may have been sensitized to the antigens from the blood transfusion. G3 2008, full-term VBAC, no complications G4, first trimester elective termination of pregnancy G5, first trimester spontaneous abortion G6, 2019 full-term VBAC.  She screened positive for the anti-E and anti-c antibody with a low titer level of between 1:1 to 1:4.  Her baby from this pregnancy developed severe anemia following delivery along with hyperbilirubinemia.  That child received IVIG therapy, phototherapy, and a double volume exchange transfusion. G7, 2020 cesarean delivery at 36 weeks and 6 days due to footling breech.  Her anti-c antibody titer levels in that pregnancy were 1:32.  She also screened positive for the anti-E antibodies during that pregnancy.  Her MCA Doppler studies at 36+ weeks was elevated 1.52 MoM, indicating possible fetal anemia.  The child was not anemic after birth and did not require a transfusion.  The father for her first three pregnancies was her ex-husband.  Her current partner is the father of pregnancies 5 through 1.  Her current partner has refused to be tested to determine if he carries the c or E antigens.  She states that he does not have health insurance.  She denies any other significant past medical history.  The patient reports  that she had a cell free DNA test drawn at around 10 weeks of her current pregnancy to screen for fetal aneuploidy.  The results are currently pending.  An ultrasound performed today shows a crown-rump length that is consistent with 12 weeks and 5 days, making her [facility] January 04, 2021.  The [facility] of January 04, 2021 would be more consistent with the [facility] based on her last menstrual period, of which she was sure of.  The patient reports that her [facility] of January 12, 2021 was based on an ultrasound that was performed at around 6 weeks in your office.  During our consultation today, the following was discussed:  Isoimmunization in pregnancy  The implications and management of isoimmunization was discussed with the patient today.  She was advised that she may have been exposed to the c and E antigens either in her prior pregnancies or during her blood transfusion.  She was advised that should the fetus that she is carrying have the c or E antigens, that the anti-c  and anti-E antibodies in her blood may cross over the placenta and cause fetal anemia.    As it is uncertain whether she was exposed to the c and E antigens from her prior pregnancies or from her blood transfusion, her partner may be screened to determine if he carries the c and E antigens.  Should he be negative for the c and E antigens and it is certain that he is the father of her baby, then there is a high likelihood that she was exposed to the c and E antigens during her  prior blood transfusion (or from a former partner) and therefore, the fetus should not be at risk for anemia.  The patient reports that her partner does not have healthcare insurance and it may be difficult for him to get tested.  She was advised that as her anti-c antibody titer levels have already reached a critical titer of 1:32 and as she possibly may have had an affected child with anemia after birth, that we will follow her closely with weekly to every  other week assessments of the  peak systolic velocity of the middle cerebral arteries starting at around 16 to 17 weeks to screen for fetal anemia.  The patient understands that should fetal anemia be suspected during her prenatal ultrasounds, that she may require in intra-uterine fetal blood transfusion or delivery depending on her gestational age.    She understands that as this is not her first pregnancy that has been affected by isoimmunization, that there is a possibility that the intensity of isoimmunization may worsen with each successive pregnancy and therefore, the fetus that she is carrying may develop anemia at an early gestational age and may require an intrauterine fetal blood transfusion.  She may have to be referred to a fetal treatment center for the procedure.  Advanced maternal age in pregnancy  The increased risk of fetal aneuploidy associated with advanced maternal age was discussed.  We will await the results of her cell free DNA test.    She understands that definitive diagnosis of fetal aneuploidy can only be made via a CVS or amniocentesis procedure.  We will schedule a detailed fetal anatomy scan for her at around 19 weeks to screen for markers of aneuploidy and to screen for fetal anomalies.  As she is over the age of 60, weekly fetal testing should be started at around 36 weeks.  Uncertain gestational age  We will reassess the fetal growth during her next ultrasound.  Should the fetal biometry measurements during her future ultrasound exams be consistent with an [facility] of January 04, 2021, this date should be used as her final EDC. At the end of the consultation, the patient stated that all her questions have been answered to her complete satisfaction.  A follow-up exam along with measurements of the peak systolic velocity of the middle cerebral arteries was scheduled in our office in 5 weeks.  Time spent in consultation 40 minutes.

## 2020-07-08 NOTE — L&D Delivery Note (Signed)
Operative Delivery Note At 10:04 PM a viable female was delivered via Vaginal, Vacuum Investment banker, operational).  Presentation: vertex with left arm in front of head; Position: Occiput,, Anterior; Station: 0.  I was called to room 2154 for fetal heart rate in the 80s The baby had been having deep repetitive variables at 2126  Fetal bradycardic to 80s at 2148 I arrived at 2158 At that time the heart rate was in the 80s and now had been for 10 minutes The patient did not have an epidural I did a cervical exam The cervix was 8 cm in a 34 week baby with the left arm presenting I reduced the left arm and had the patient to push and she was moving the baby At this point it was 2203 and I knew we were 15 minutes remotre from baby delivered if we went to C section The patient had generous room in the pelvis and I knew I could get the baby delivered with 1 pus with the vacuum.  I placed the vacuum at 0 station and in 12 seconds had the baby delivered Neonatology was present for delivery  Verbal consent: obtained from patient.  Risks and benefits discussed in detail.  Risks include, but are not limited to the risks of anesthesia, bleeding, infection, damage to maternal tissues, fetal cephalhematoma.  There is also the risk of inability to effect vaginal delivery of the head, or shoulder dystocia that cannot be resolved by established maneuvers, leading to the need for emergency cesarean section.  APGAR: 9, 10; weight 3 lb 12.3 oz (1710 g).   Placenta status:intact , .   Cord:  with the following complications: .  Cord pH: pending arterial  Anesthesia: none  Instruments: bell vacuum Episiotomy: None Lacerations: None Suture Repair:  Est. Blood Loss (mL):  250 ml  Mom to [facility].  Baby to NICU.  Lazaro Arms 11/24/2020, 10:44 PM

## 2020-08-01 ENCOUNTER — Other Ambulatory Visit: Payer: Self-pay | Admitting: *Deleted

## 2020-08-01 ENCOUNTER — Encounter: Payer: Self-pay | Admitting: *Deleted

## 2020-08-01 ENCOUNTER — Other Ambulatory Visit: Payer: Self-pay

## 2020-08-01 ENCOUNTER — Ambulatory Visit: Payer: Medicaid Other | Admitting: *Deleted

## 2020-08-01 ENCOUNTER — Other Ambulatory Visit: Payer: Self-pay | Admitting: Obstetrics

## 2020-08-01 ENCOUNTER — Ambulatory Visit: Payer: Medicaid Other | Attending: Obstetrics

## 2020-08-01 VITALS — BP 129/76 | HR 63

## 2020-08-01 DIAGNOSIS — O99212 Obesity complicating pregnancy, second trimester: Secondary | ICD-10-CM | POA: Diagnosis not present

## 2020-08-01 DIAGNOSIS — Z363 Encounter for antenatal screening for malformations: Secondary | ICD-10-CM

## 2020-08-01 DIAGNOSIS — O361120 Maternal care for Anti-A sensitization, second trimester, not applicable or unspecified: Secondary | ICD-10-CM

## 2020-08-01 DIAGNOSIS — O361110 Maternal care for Anti-A sensitization, first trimester, not applicable or unspecified: Secondary | ICD-10-CM | POA: Insufficient documentation

## 2020-08-01 DIAGNOSIS — E039 Hypothyroidism, unspecified: Secondary | ICD-10-CM

## 2020-08-01 DIAGNOSIS — O09522 Supervision of elderly multigravida, second trimester: Secondary | ICD-10-CM

## 2020-08-01 DIAGNOSIS — O361920 Maternal care for other isoimmunization, second trimester, not applicable or unspecified: Secondary | ICD-10-CM

## 2020-08-01 DIAGNOSIS — O99282 Endocrine, nutritional and metabolic diseases complicating pregnancy, second trimester: Secondary | ICD-10-CM | POA: Diagnosis not present

## 2020-08-01 DIAGNOSIS — O09212 Supervision of pregnancy with history of pre-term labor, second trimester: Secondary | ICD-10-CM

## 2020-08-01 DIAGNOSIS — Z3A17 17 weeks gestation of pregnancy: Secondary | ICD-10-CM

## 2020-08-01 DIAGNOSIS — O34219 Maternal care for unspecified type scar from previous cesarean delivery: Secondary | ICD-10-CM

## 2020-08-11 ENCOUNTER — Other Ambulatory Visit: Payer: Self-pay | Admitting: Internal Medicine

## 2020-08-11 ENCOUNTER — Other Ambulatory Visit: Payer: Self-pay

## 2020-08-11 DIAGNOSIS — E039 Hypothyroidism, unspecified: Secondary | ICD-10-CM

## 2020-08-15 ENCOUNTER — Ambulatory Visit: Payer: Medicaid Other | Attending: Obstetrics

## 2020-08-15 ENCOUNTER — Other Ambulatory Visit: Payer: Self-pay

## 2020-08-15 ENCOUNTER — Encounter: Payer: Self-pay | Admitting: *Deleted

## 2020-08-15 ENCOUNTER — Ambulatory Visit: Payer: Medicaid Other | Admitting: *Deleted

## 2020-08-15 ENCOUNTER — Other Ambulatory Visit: Payer: Self-pay | Admitting: *Deleted

## 2020-08-15 VITALS — BP 125/77 | HR 70

## 2020-08-15 DIAGNOSIS — Z363 Encounter for antenatal screening for malformations: Secondary | ICD-10-CM | POA: Diagnosis not present

## 2020-08-15 DIAGNOSIS — O09522 Supervision of elderly multigravida, second trimester: Secondary | ICD-10-CM

## 2020-08-15 DIAGNOSIS — O34219 Maternal care for unspecified type scar from previous cesarean delivery: Secondary | ICD-10-CM

## 2020-08-15 DIAGNOSIS — O361120 Maternal care for Anti-A sensitization, second trimester, not applicable or unspecified: Secondary | ICD-10-CM | POA: Diagnosis not present

## 2020-08-15 DIAGNOSIS — E039 Hypothyroidism, unspecified: Secondary | ICD-10-CM

## 2020-08-15 DIAGNOSIS — Z3A19 19 weeks gestation of pregnancy: Secondary | ICD-10-CM

## 2020-08-15 DIAGNOSIS — O99212 Obesity complicating pregnancy, second trimester: Secondary | ICD-10-CM

## 2020-08-15 DIAGNOSIS — O09212 Supervision of pregnancy with history of pre-term labor, second trimester: Secondary | ICD-10-CM

## 2020-08-15 DIAGNOSIS — O99282 Endocrine, nutritional and metabolic diseases complicating pregnancy, second trimester: Secondary | ICD-10-CM

## 2020-08-16 DIAGNOSIS — O0990 Supervision of high risk pregnancy, unspecified, unspecified trimester: Secondary | ICD-10-CM | POA: Insufficient documentation

## 2020-08-16 DIAGNOSIS — Z3480 Encounter for supervision of other normal pregnancy, unspecified trimester: Secondary | ICD-10-CM | POA: Insufficient documentation

## 2020-08-17 ENCOUNTER — Other Ambulatory Visit: Payer: Self-pay

## 2020-08-17 ENCOUNTER — Ambulatory Visit (INDEPENDENT_AMBULATORY_CARE_PROVIDER_SITE_OTHER): Payer: Medicaid Other | Admitting: Obstetrics and Gynecology

## 2020-08-17 VITALS — BP 145/90 | HR 73 | Wt 292.0 lb

## 2020-08-17 DIAGNOSIS — E039 Hypothyroidism, unspecified: Secondary | ICD-10-CM

## 2020-08-17 DIAGNOSIS — R7689 Other specified abnormal immunological findings in serum: Secondary | ICD-10-CM

## 2020-08-17 DIAGNOSIS — Z6841 Body Mass Index (BMI) 40.0 and over, adult: Secondary | ICD-10-CM

## 2020-08-17 DIAGNOSIS — O10013 Pre-existing essential hypertension complicating pregnancy, third trimester: Secondary | ICD-10-CM | POA: Insufficient documentation

## 2020-08-17 DIAGNOSIS — R768 Other specified abnormal immunological findings in serum: Secondary | ICD-10-CM

## 2020-08-17 DIAGNOSIS — Z3480 Encounter for supervision of other normal pregnancy, unspecified trimester: Secondary | ICD-10-CM

## 2020-08-17 DIAGNOSIS — Z3A20 20 weeks gestation of pregnancy: Secondary | ICD-10-CM

## 2020-08-17 DIAGNOSIS — O162 Unspecified maternal hypertension, second trimester: Secondary | ICD-10-CM

## 2020-08-17 DIAGNOSIS — Z3482 Encounter for supervision of other normal pregnancy, second trimester: Secondary | ICD-10-CM

## 2020-08-17 DIAGNOSIS — O119 Pre-existing hypertension with pre-eclampsia, unspecified trimester: Secondary | ICD-10-CM | POA: Insufficient documentation

## 2020-08-17 DIAGNOSIS — O99212 Obesity complicating pregnancy, second trimester: Secondary | ICD-10-CM

## 2020-08-17 MED ORDER — BLOOD PRESSURE KIT DEVI
1.0000 | 0 refills | Status: DC
Start: 2020-08-17 — End: 2024-03-30

## 2020-08-17 NOTE — Progress Notes (Signed)
NOB Xfer from [facility].

## 2020-08-17 NOTE — Progress Notes (Signed)
   PRENATAL VISIT NOTE  Subjective:  Marisa Campbell is a 40 y.o. [phone] at [redacted]w[redacted]d being seen today for ongoing prenatal care.  She is currently monitored for the following issues for this high-risk pregnancy and has COVID-19 virus infection; Red blood cell antibody positive; Low grade squamous intraepithelial lesion on cytologic smear of cervix (LGSIL); CIN I (cervical intraepithelial neoplasia I); Hypothyroidism; Supervision of other normal pregnancy, antepartum; [redacted] weeks gestation of pregnancy; Hypertension affecting pregnancy in second trimester; Obesity affecting pregnancy in second trimester; and BMI 45.0-49.9, adult ([place]) on their problem list.  Patient doing well with no acute concerns today. She reports no complaints.  Contractions: Not present. Vag. Bleeding: None.  Movement: Present. Denies leaking of fluid.   The patient is transferring from [facility].  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  Pt already is being followed by MFM and has been followed in the past for isoimmunization.  Pt has had 2 sections but has had multiple successful VBACs.  She would like another TOLAC if possible.  The following portions of the patient's history were reviewed and updated as appropriate: allergies, current medications, past family history, past medical history, past social history, past surgical history and problem list. Problem list updated.  Objective:   Vitals:   08/17/20 0825 08/17/20 0827 08/17/20 0833  BP: (!) 147/83 (!) 146/96 (!) 145/90  Pulse: 69 82 73  Weight: 292 lb (132.5 kg)      Fetal Status: Fetal Heart Rate (bpm): 156   Movement: Present     General:  Alert, oriented and cooperative. Patient is in no acute distress.  Skin: Skin is warm and dry. No rash noted.   Cardiovascular: Normal heart rate noted  Respiratory: Normal respiratory effort, no problems with respiration noted  Abdomen: Soft, gravid, appropriate for gestational age.  Pain/Pressure: Absent      Pelvic: Cervical exam deferred        Extremities: Normal range of motion.  Edema: Trace  Mental Status:  Normal mood and affect. Normal behavior. Normal judgment and thought content.   Assessment and Plan:  Pregnancy: [id number] at [redacted]w[redacted]d  1. Supervision of other normal pregnancy, antepartum Add patient to red chart rounds - Blood Pressure Monitoring (BLOOD PRESSURE KIT) DEVI; 1 kit by Does not apply route once a week. Check Blood Pressure regularly and record readings into the Babyscripts App.  Large Cuff.  DX O90.0  Dispense: 1 each; Refill: 0 - AFP, Serum, Open Spina Bifida  2. Hypothyroidism, unspecified type  - TSH  3. Red blood cell antibody positive Bi weekly testing per MFM with dopplers  4. [redacted] weeks gestation of pregnancy   5. Hypertension affecting pregnancy in second trimester Recheck BP in 2 weeks, if still elevated consider CHTN and start low dose labetalol Get baseline PIH labs - CBC - Comprehensive metabolic panel - Protein / creatinine ratio, urine  6. Obesity affecting pregnancy in second trimester   7. BMI 45.0-49.9, adult ([place])   Preterm labor symptoms and general obstetric precautions including but not limited to vaginal bleeding, contractions, leaking of fluid and fetal movement were reviewed in detail with the patient.  Please refer to After Visit Summary for other counseling recommendations.   Return in about 4 weeks (around 09/14/2020) for [facility], in person.   Lynnda Shields, MD Faculty Attending Center for [facility]

## 2020-08-17 NOTE — Patient Instructions (Signed)
Hypertension During Pregnancy Hypertension is also called high blood pressure. High blood pressure means that the force of the blood moving in your body is high enough to cause problems for you and your baby. Different types of high blood pressure can happen during pregnancy. The types are:  High blood pressure before you got pregnant. This is called chronic hypertension.  This can continue during your pregnancy. Your doctor will want to keep checking your blood pressure. You may need medicine to control your blood pressure while you are pregnant. You will need follow-up visits after you have your baby.  High blood pressure that goes up during pregnancy when it was normal before. This is called gestational hypertension. It will often get better after you have your baby, but your doctor will need to watch your blood pressure to make sure that it is getting better.  You may develop high blood pressure after giving birth. This is called postpartum hypertension. This often occurs within 48 hours after childbirth but may occur up to 6 weeks after giving birth. Very high blood pressure during pregnancy is an emergency that needs treatment right away. How does this affect me? If you have high blood pressure during pregnancy, you have a higher chance of developing high blood pressure:  As you get older.  If you get pregnant again. In some cases, high blood pressure during pregnancy can cause:  Stroke.  Heart attack.  Damage to the kidneys, lungs, or liver.  Preeclampsia.  HELLP syndrome.  Seizures.  Problems with the placenta. How does this affect my baby? Your baby may:  Be born early.  Not weigh as much as he or she should.  Not handle labor well, leading to a C-section. This condition may also result in a baby's death before birth (stillbirth). What are the risks?  Having high blood pressure during a past pregnancy.  Being overweight.  Being age 35 or older.  Being pregnant  for the first time.  Being pregnant with more than one baby.  Becoming pregnant using fertility methods, such as IVF.  Having other problems, such as diabetes or kidney disease. What can I do to lower my risk?  Keep a healthy weight.  Eat a healthy diet.  Follow what your doctor tells you about treating any medical problems that you had before you got pregnant. It is very important to go to all of your doctor visits. Your doctor will check your blood pressure and make sure that your pregnancy is progressing as it should. Treatment should start early if a problem is found.   How is this treated? Treatment for high blood pressure during pregnancy can vary. It depends on the type of high blood pressure you have and how serious it is.  If you were taking medicine for your blood pressure before you got pregnant, talk with your doctor. You may need to change the medicine during pregnancy if it is not safe for your baby.  If your blood pressure goes up during pregnancy, your doctor may order medicine to treat this.  If you are at risk for preeclampsia, your doctor may tell you to take a low-dose aspirin while you are pregnant.  If you have very high blood pressure, you may need to stay in the hospital so you and your baby can be watched closely. You may also need to take medicine to lower your blood pressure.  In some cases, if your condition gets worse, you may need to have your baby early.   Follow these instructions at home: Eating and drinking  Drink enough fluid to keep your pee (urine) pale yellow.  Avoid caffeine.   Lifestyle  Do not smoke or use any products that contain nicotine or tobacco. If you need help quitting, ask your doctor.  Do not use alcohol or drugs.  Avoid stress.  Rest and get plenty of sleep.  Regular exercise can help. Ask your doctor what kinds of exercise are best for you. General instructions  Take over-the-counter and prescription medicines only as  told by your doctor.  Keep all prenatal and follow-up visits. Contact a doctor if:  You have symptoms that your doctor told you to watch for, such as: ? Headaches. ? A feeling like you may vomit (nausea). ? Vomiting. ? Belly (abdominal) pain. ? Feeling dizzy or light-headed. Get help right away if:  You have symptoms of serious problems, such as: ? Very bad belly pain that does not get better with treatment. ? A very bad headache that does not get better. ? Blurry vision. ? Double vision. ? Vomiting that does not get better. ? Sudden, fast weight gain. ? Sudden swelling in your hands, ankles, or face. ? Bleeding from your vagina. ? Blood in your pee. ? Shortness of breath. ? Chest pain. ? Weakness on one side of your body. ? Trouble talking.  Your baby is not moving as much as usual. These symptoms may be an emergency. Get help right away. Call your local emergency services (911 in the [place]  Do not wait to see if the symptoms will go away.  Do not drive yourself to the hospital. Summary  High blood pressure is also called hypertension.  High blood pressure means that the force of the blood moving in your body is high enough to cause problems for you and your baby.  Get help right away if you have symptoms of serious problems due to high blood pressure.  Keep all prenatal and follow-up visits. This information is not intended to replace advice given to you by your health care provider. Make sure you discuss any questions you have with your health care provider. Document Revised: 03/16/2020 Document Reviewed: 03/16/2020 Elsevier Patient Education  2021 Elsevier Inc.  

## 2020-08-19 LAB — AFP, SERUM, OPEN SPINA BIFIDA
AFP MoM: 1.54
AFP Value: 60.1 ng/mL
Gest. Age on Collection Date: 20 weeks
Maternal Age At EDD: 40.9 yr
OSBR Risk 1 IN: 2457
Test Results:: NEGATIVE
Weight: 292 [lb_av]

## 2020-08-19 LAB — PROTEIN / CREATININE RATIO, URINE
Creatinine, Urine: 79.4 mg/dL
Protein, Ur: 8.1 mg/dL
Protein/Creat Ratio: 102 mg/g creat (ref 0–200)

## 2020-08-19 LAB — COMPREHENSIVE METABOLIC PANEL
ALT: 16 IU/L (ref 0–32)
AST: 18 IU/L (ref 0–40)
Albumin/Globulin Ratio: 1.3 (ref 1.2–2.2)
Albumin: 3.8 g/dL (ref 3.8–4.8)
Alkaline Phosphatase: 62 IU/L (ref 44–121)
BUN/Creatinine Ratio: 13 (ref 9–23)
BUN: 8 mg/dL (ref 6–24)
Bilirubin Total: 0.3 mg/dL (ref 0.0–1.2)
CO2: 18 mmol/L — ABNORMAL LOW (ref 20–29)
Calcium: 9.4 mg/dL (ref 8.7–10.2)
Chloride: 103 mmol/L (ref 96–106)
Creatinine, Ser: 0.6 mg/dL (ref 0.57–1.00)
GFR calc Af Amer: 132 mL/min/{1.73_m2} (ref >59)
GFR calc non Af Amer: 114 mL/min/{1.73_m2} (ref >59)
Globulin, Total: 3 g/dL (ref 1.5–4.5)
Glucose: 94 mg/dL (ref 65–99)
Potassium: 4 mmol/L (ref 3.5–5.2)
Sodium: 136 mmol/L (ref 134–144)
Total Protein: 6.8 g/dL (ref 6.0–8.5)

## 2020-08-19 LAB — CBC
Hematocrit: 38.7 % (ref 34.0–46.6)
Hemoglobin: 13 g/dL (ref 11.1–15.9)
MCH: 29.1 pg (ref 26.6–33.0)
MCHC: 33.6 g/dL (ref 31.5–35.7)
MCV: 87 fL (ref 79–97)
Platelets: 202 10*3/uL (ref 150–450)
RBC: 4.47 x10E6/uL (ref 3.77–5.28)
RDW: 14.5 % (ref 11.7–15.4)
WBC: 9.3 10*3/uL (ref 3.4–10.8)

## 2020-08-19 LAB — TSH: TSH: 1.36 u[IU]/mL (ref 0.450–4.500)

## 2020-08-29 ENCOUNTER — Ambulatory Visit: Payer: Medicaid Other | Attending: Obstetrics

## 2020-08-29 ENCOUNTER — Encounter: Payer: Self-pay | Admitting: *Deleted

## 2020-08-29 ENCOUNTER — Ambulatory Visit: Payer: Medicaid Other | Admitting: *Deleted

## 2020-08-29 ENCOUNTER — Other Ambulatory Visit: Payer: Self-pay

## 2020-08-29 DIAGNOSIS — Z3480 Encounter for supervision of other normal pregnancy, unspecified trimester: Secondary | ICD-10-CM | POA: Insufficient documentation

## 2020-08-29 DIAGNOSIS — O361120 Maternal care for Anti-A sensitization, second trimester, not applicable or unspecified: Secondary | ICD-10-CM | POA: Diagnosis present

## 2020-08-30 DIAGNOSIS — Z362 Encounter for other antenatal screening follow-up: Secondary | ICD-10-CM | POA: Diagnosis not present

## 2020-08-30 DIAGNOSIS — O321XX0 Maternal care for breech presentation, not applicable or unspecified: Secondary | ICD-10-CM

## 2020-08-30 DIAGNOSIS — O361120 Maternal care for Anti-A sensitization, second trimester, not applicable or unspecified: Secondary | ICD-10-CM

## 2020-08-30 DIAGNOSIS — O99212 Obesity complicating pregnancy, second trimester: Secondary | ICD-10-CM | POA: Diagnosis not present

## 2020-08-30 DIAGNOSIS — O09522 Supervision of elderly multigravida, second trimester: Secondary | ICD-10-CM | POA: Diagnosis not present

## 2020-08-30 DIAGNOSIS — Z3A21 21 weeks gestation of pregnancy: Secondary | ICD-10-CM

## 2020-08-30 DIAGNOSIS — O34219 Maternal care for unspecified type scar from previous cesarean delivery: Secondary | ICD-10-CM

## 2020-08-30 DIAGNOSIS — E039 Hypothyroidism, unspecified: Secondary | ICD-10-CM

## 2020-08-30 DIAGNOSIS — O99282 Endocrine, nutritional and metabolic diseases complicating pregnancy, second trimester: Secondary | ICD-10-CM

## 2020-08-31 ENCOUNTER — Ambulatory Visit (INDEPENDENT_AMBULATORY_CARE_PROVIDER_SITE_OTHER): Payer: Medicaid Other

## 2020-08-31 ENCOUNTER — Other Ambulatory Visit: Payer: Self-pay

## 2020-08-31 VITALS — BP 131/89 | HR 70

## 2020-08-31 DIAGNOSIS — Z3A22 22 weeks gestation of pregnancy: Secondary | ICD-10-CM

## 2020-08-31 DIAGNOSIS — O132 Gestational [pregnancy-induced] hypertension without significant proteinuria, second trimester: Secondary | ICD-10-CM

## 2020-08-31 DIAGNOSIS — Z0130 Encounter for examination of blood pressure without abnormal findings: Secondary | ICD-10-CM

## 2020-08-31 NOTE — Progress Notes (Addendum)
..  Subjective:  Marisa Campbell is a 40 y.o. female here for BP check.   Hypertension ROS: no TIA's, no chest pain on exertion, no dyspnea on exertion and no swelling of ankles.    Objective:  BP 131/89   Pulse 70   LMP 03/31/2020 (Exact Date)   Appearance alert, well appearing, and in no distress. General exam BP noted to be well controlled today in office.    Assessment:   Blood Pressure improved.   Plan:  Current treatment plan is effective, no change in therapy.. Discussed with Dr. Clearance Coots, no change in therapy based on BP today, advised pt to follow up if abnormal symptoms should occur.   Patient was assessed and managed by nursing staff during this encounter. I have reviewed the chart and agree with the documentation and plan. I have also made any necessary editorial changes.  Coral Ceo, MD 08/31/2020 11:21 AM

## 2020-09-07 ENCOUNTER — Encounter: Payer: Self-pay | Admitting: Obstetrics and Gynecology

## 2020-09-07 ENCOUNTER — Encounter: Payer: Self-pay | Admitting: Obstetrics & Gynecology

## 2020-09-07 DIAGNOSIS — O09529 Supervision of elderly multigravida, unspecified trimester: Secondary | ICD-10-CM | POA: Insufficient documentation

## 2020-09-13 ENCOUNTER — Encounter: Payer: Self-pay | Admitting: *Deleted

## 2020-09-13 ENCOUNTER — Ambulatory Visit: Payer: Medicaid Other | Attending: Obstetrics and Gynecology

## 2020-09-13 ENCOUNTER — Ambulatory Visit: Payer: Medicaid Other | Admitting: *Deleted

## 2020-09-13 ENCOUNTER — Other Ambulatory Visit: Payer: Self-pay

## 2020-09-13 VITALS — BP 106/52 | HR 75

## 2020-09-13 DIAGNOSIS — O09212 Supervision of pregnancy with history of pre-term labor, second trimester: Secondary | ICD-10-CM | POA: Diagnosis not present

## 2020-09-13 DIAGNOSIS — Z3A23 23 weeks gestation of pregnancy: Secondary | ICD-10-CM

## 2020-09-13 DIAGNOSIS — E669 Obesity, unspecified: Secondary | ICD-10-CM

## 2020-09-13 DIAGNOSIS — O10912 Unspecified pre-existing hypertension complicating pregnancy, second trimester: Secondary | ICD-10-CM

## 2020-09-13 DIAGNOSIS — O99282 Endocrine, nutritional and metabolic diseases complicating pregnancy, second trimester: Secondary | ICD-10-CM

## 2020-09-13 DIAGNOSIS — E039 Hypothyroidism, unspecified: Secondary | ICD-10-CM

## 2020-09-13 DIAGNOSIS — O09522 Supervision of elderly multigravida, second trimester: Secondary | ICD-10-CM | POA: Diagnosis not present

## 2020-09-13 DIAGNOSIS — O361920 Maternal care for other isoimmunization, second trimester, not applicable or unspecified: Secondary | ICD-10-CM

## 2020-09-13 DIAGNOSIS — O99212 Obesity complicating pregnancy, second trimester: Secondary | ICD-10-CM

## 2020-09-14 ENCOUNTER — Ambulatory Visit (INDEPENDENT_AMBULATORY_CARE_PROVIDER_SITE_OTHER): Payer: Medicaid Other | Admitting: Obstetrics and Gynecology

## 2020-09-14 ENCOUNTER — Encounter: Payer: Self-pay | Admitting: Obstetrics and Gynecology

## 2020-09-14 VITALS — BP 134/83 | HR 69 | Wt 289.0 lb

## 2020-09-14 DIAGNOSIS — Z6841 Body Mass Index (BMI) 40.0 and over, adult: Secondary | ICD-10-CM

## 2020-09-14 DIAGNOSIS — O0992 Supervision of high risk pregnancy, unspecified, second trimester: Secondary | ICD-10-CM

## 2020-09-14 DIAGNOSIS — Z3A24 24 weeks gestation of pregnancy: Secondary | ICD-10-CM

## 2020-09-14 DIAGNOSIS — K219 Gastro-esophageal reflux disease without esophagitis: Secondary | ICD-10-CM

## 2020-09-14 DIAGNOSIS — R768 Other specified abnormal immunological findings in serum: Secondary | ICD-10-CM

## 2020-09-14 DIAGNOSIS — Z98891 History of uterine scar from previous surgery: Secondary | ICD-10-CM | POA: Insufficient documentation

## 2020-09-14 DIAGNOSIS — O162 Unspecified maternal hypertension, second trimester: Secondary | ICD-10-CM

## 2020-09-14 DIAGNOSIS — E039 Hypothyroidism, unspecified: Secondary | ICD-10-CM

## 2020-09-14 DIAGNOSIS — O99212 Obesity complicating pregnancy, second trimester: Secondary | ICD-10-CM

## 2020-09-14 MED ORDER — FAMOTIDINE 20 MG PO TABS: 20.0000 mg | ORAL_TABLET | Freq: Every day | ORAL | 3 refills | Status: DC

## 2020-09-14 MED ORDER — LEVOTHYROXINE SODIUM 137 MCG PO TABS: 137.0000 ug | ORAL_TABLET | Freq: Every day | ORAL | 2 refills | Status: DC

## 2020-09-14 NOTE — Progress Notes (Signed)
PRENATAL VISIT NOTE  Subjective:  Marisa Campbell is a 40 y.o. [phone] at [redacted]w[redacted]d being seen today for ongoing prenatal care.  She is currently monitored for the following issues for this high-risk pregnancy and has COVID-19 virus infection; Red blood cell antibody positive; Low grade squamous intraepithelial lesion on cytologic smear of cervix (LGSIL); CIN I (cervical intraepithelial neoplasia I); Hypothyroidism; Supervision of high-risk pregnancy; Hypertension affecting pregnancy in second trimester; Obesity affecting pregnancy in second trimester; BMI 45.0-49.9, adult (HCC); Advanced maternal age in multigravida; and H/O: C-section on their problem list.  Patient reports heartburn.  Contractions: Not present. Vag. Bleeding: None.  Movement: Present. Denies leaking of fluid.   The following portions of the patient's history were reviewed and updated as appropriate: allergies, current medications, past family history, past medical history, past social history, past surgical history and problem list.   Objective:   Vitals:   09/14/20 0856  BP: 134/83  Pulse: 69  Weight: 289 lb (131.1 kg)    Fetal Status: Fetal Heart Rate (bpm): 145   Movement: Present     General:  Alert, oriented and cooperative. Patient is in no acute distress.  Skin: Skin is warm and dry. No rash noted.   Cardiovascular: Normal heart rate noted  Respiratory: Normal respiratory effort, no problems with respiration noted  Abdomen: Soft, gravid, appropriate for gestational age.  Pain/Pressure: Absent     Pelvic: Cervical exam deferred        Extremities: Normal range of motion.  Edema: Trace  Mental Status: Normal mood and affect. Normal behavior. Normal judgment and thought content.   Assessment and Plan:  Pregnancy: [id number] at [redacted]w[redacted]d 1. Hypertension affecting pregnancy in second trimester Normotensive today  2. Supervision of high risk pregnancy in second trimester  3. Obesity affecting pregnancy in second  trimester  4. BMI 45.0-49.9, adult (HCC)  5. Red blood cell antibody positive Anti E and Anti-c ab Biweekly dopplers with MFM Last dopplers yesterday showed no evidence of anemia  6. [redacted] weeks gestation of pregnancy  7. H/O: C-section x2 -Has had 3x VBAC after 1st CS, 2nd CS due to transverse position of baby (changed position during labor and found to have hand/foot down after AROM) - reviewed will let patient TOLAC however we will not induce after 2 c-section, she verbalizes understanding of same  9. Hypothyroidism, unspecified type - Pt requesting increased in thyroid meds due to brain fog and poor memory, stats she has had these symptoms before with low thyroid levels - as she is well within range, will increase meds from 125 mcg to 137 mcg - recheck next visit   8. Gastroesophageal reflux disease without esophagitis Has tried many things with no improvement, has resorted to not eating dinner because she wakes up with reflux   Preterm labor symptoms and general obstetric precautions including but not limited to vaginal bleeding, contractions, leaking of fluid and fetal movement were reviewed in detail with the patient. Please refer to After Visit Summary for other counseling recommendations.   Return in about 4 weeks (around 10/12/2020) for high OB, in person, 2 hr GTT, 3rd trim labs, TSH.  Future Appointments  Date Time Provider Department Center  09/26/2020  7:15 AM [facility] NURSE [facility]  09/26/2020  7:30 AM [facility] US3 [facility]  10/10/2020  7:15 AM [facility] NURSE [facility]  10/10/2020  7:30 AM [facility] US3 [facility]  10/12/2020  8:35 AM Rasch, Harolyn Rutherford, NP [facility] None    Ivory Broad  Earlene Plater, MD

## 2020-09-14 NOTE — Progress Notes (Signed)
+   Fetal movement. No complaints.  

## 2020-09-18 ENCOUNTER — Telehealth: Payer: Self-pay

## 2020-09-18 NOTE — Telephone Encounter (Signed)
Recv'd voicemail from pt states while sitting on the commode, she sneezed and felt a "pop" "like something tore."  She reports feeling "pressure and burning." It feels like she had an "episotomy."  Pt does not feel anything with her fingers but she is concerned something internally is wrong. Denies VB, denies LOF, +FM  Pt needs VE No appts in the office today, consulted with MD pt to report to MAU.  Pt voiced concerns about childcare issues. She has two small children age 85 and 2 at home sick with the stomach bug.  Pt will report to MAU once she finds childcare.

## 2020-09-26 ENCOUNTER — Encounter: Payer: Self-pay | Admitting: *Deleted

## 2020-09-26 ENCOUNTER — Other Ambulatory Visit: Payer: Self-pay

## 2020-09-26 ENCOUNTER — Ambulatory Visit: Payer: Medicaid Other | Admitting: *Deleted

## 2020-09-26 ENCOUNTER — Other Ambulatory Visit: Payer: Self-pay | Admitting: *Deleted

## 2020-09-26 ENCOUNTER — Ambulatory Visit: Payer: Medicaid Other | Attending: Obstetrics and Gynecology

## 2020-09-26 VITALS — BP 131/75 | HR 68

## 2020-09-26 DIAGNOSIS — E669 Obesity, unspecified: Secondary | ICD-10-CM | POA: Diagnosis not present

## 2020-09-26 DIAGNOSIS — O99282 Endocrine, nutritional and metabolic diseases complicating pregnancy, second trimester: Secondary | ICD-10-CM

## 2020-09-26 DIAGNOSIS — O99212 Obesity complicating pregnancy, second trimester: Secondary | ICD-10-CM | POA: Diagnosis not present

## 2020-09-26 DIAGNOSIS — O34219 Maternal care for unspecified type scar from previous cesarean delivery: Secondary | ICD-10-CM

## 2020-09-26 DIAGNOSIS — Z3A25 25 weeks gestation of pregnancy: Secondary | ICD-10-CM

## 2020-09-26 DIAGNOSIS — O09522 Supervision of elderly multigravida, second trimester: Secondary | ICD-10-CM

## 2020-09-26 DIAGNOSIS — E039 Hypothyroidism, unspecified: Secondary | ICD-10-CM

## 2020-09-26 DIAGNOSIS — O361920 Maternal care for other isoimmunization, second trimester, not applicable or unspecified: Secondary | ICD-10-CM

## 2020-09-26 DIAGNOSIS — O09212 Supervision of pregnancy with history of pre-term labor, second trimester: Secondary | ICD-10-CM

## 2020-10-10 ENCOUNTER — Ambulatory Visit: Payer: Medicaid Other | Attending: Obstetrics and Gynecology

## 2020-10-10 ENCOUNTER — Ambulatory Visit: Payer: Medicaid Other | Admitting: *Deleted

## 2020-10-10 ENCOUNTER — Encounter: Payer: Self-pay | Admitting: *Deleted

## 2020-10-10 ENCOUNTER — Other Ambulatory Visit: Payer: Self-pay | Admitting: *Deleted

## 2020-10-10 ENCOUNTER — Other Ambulatory Visit: Payer: Self-pay

## 2020-10-10 VITALS — BP 124/63 | HR 64

## 2020-10-10 DIAGNOSIS — O361920 Maternal care for other isoimmunization, second trimester, not applicable or unspecified: Secondary | ICD-10-CM | POA: Diagnosis not present

## 2020-10-10 DIAGNOSIS — Z3A27 27 weeks gestation of pregnancy: Secondary | ICD-10-CM

## 2020-10-10 DIAGNOSIS — O09522 Supervision of elderly multigravida, second trimester: Secondary | ICD-10-CM

## 2020-10-10 DIAGNOSIS — O09212 Supervision of pregnancy with history of pre-term labor, second trimester: Secondary | ICD-10-CM

## 2020-10-10 DIAGNOSIS — O99212 Obesity complicating pregnancy, second trimester: Secondary | ICD-10-CM | POA: Diagnosis not present

## 2020-10-10 DIAGNOSIS — O34219 Maternal care for unspecified type scar from previous cesarean delivery: Secondary | ICD-10-CM

## 2020-10-10 DIAGNOSIS — O361120 Maternal care for Anti-A sensitization, second trimester, not applicable or unspecified: Secondary | ICD-10-CM

## 2020-10-10 DIAGNOSIS — E039 Hypothyroidism, unspecified: Secondary | ICD-10-CM

## 2020-10-10 DIAGNOSIS — O99282 Endocrine, nutritional and metabolic diseases complicating pregnancy, second trimester: Secondary | ICD-10-CM

## 2020-10-12 ENCOUNTER — Encounter: Payer: Self-pay | Admitting: Obstetrics & Gynecology

## 2020-10-12 ENCOUNTER — Other Ambulatory Visit: Payer: Self-pay

## 2020-10-12 ENCOUNTER — Other Ambulatory Visit: Payer: Medicaid Other

## 2020-10-12 ENCOUNTER — Ambulatory Visit (INDEPENDENT_AMBULATORY_CARE_PROVIDER_SITE_OTHER): Payer: Medicaid Other | Admitting: Obstetrics and Gynecology

## 2020-10-12 VITALS — BP 125/78 | HR 62 | Wt 301.0 lb

## 2020-10-12 DIAGNOSIS — O0992 Supervision of high risk pregnancy, unspecified, second trimester: Secondary | ICD-10-CM

## 2020-10-12 DIAGNOSIS — G5601 Carpal tunnel syndrome, right upper limb: Secondary | ICD-10-CM

## 2020-10-12 DIAGNOSIS — O99210 Obesity complicating pregnancy, unspecified trimester: Secondary | ICD-10-CM | POA: Insufficient documentation

## 2020-10-12 DIAGNOSIS — O162 Unspecified maternal hypertension, second trimester: Secondary | ICD-10-CM

## 2020-10-12 MED ORDER — FAMOTIDINE 20 MG PO TABS: 20.0000 mg | ORAL_TABLET | Freq: Every day | ORAL | 3 refills | Status: DC

## 2020-10-12 MED ORDER — LABETALOL HCL 100 MG PO TABS: 100.0000 mg | ORAL_TABLET | Freq: Two times a day (BID) | ORAL | 3 refills | Status: DC

## 2020-10-12 MED ORDER — LEVOTHYROXINE SODIUM 137 MCG PO TABS: 137.0000 ug | ORAL_TABLET | Freq: Every day | ORAL | 2 refills | Status: DC

## 2020-10-12 NOTE — Progress Notes (Signed)
PRENATAL VISIT NOTE  Subjective:  Marisa Campbell is a 40 y.o. [phone] at 3w0dbeing seen today for ongoing prenatal care.  She is currently monitored for the following issues for this high-risk pregnancy and has COVID-19 virus infection; Red blood cell antibody positive; Low grade squamous intraepithelial lesion on cytologic smear of cervix (LGSIL); CIN I (cervical intraepithelial neoplasia I); Hypothyroidism; Supervision of high-risk pregnancy; Hypertension affecting pregnancy in second trimester; Obesity affecting pregnancy in second trimester; BMI 45.0-49.9, adult [place]; Advanced maternal age in multigravida; H/O: C-section; and Maternal morbid obesity, antepartum [place] problem [place].  Patient reports carpel tunnel and swelling in hands. R>L. Has not seen her PCP for this. Has been wearing a brace daily. MFM requested she ask OB for lasix. No other swelling complaints. .  Contractions: Irregular. Vag. Bleeding: None.  Movement: Present. Denies leaking of fluid.   The following portions of the patient's history were reviewed and updated as appropriate: allergies, current medications, past family history, past medical history, past social history, past surgical history and problem list.   Objective:   Vitals:   10/12/20 0842 10/12/20 1037  BP: (!) 143/82 125/78  Pulse: 62   Weight: (!) 301 lb (136.5 kg)     Fetal Status: Fetal Heart Rate (bpm): 138   Movement: Present     General:  Alert, oriented and cooperative. Patient is in no acute distress.  Skin: Skin is warm and dry. No rash noted.   Cardiovascular: Normal heart rate noted  Respiratory: Normal respiratory effort, no problems with respiration noted  Abdomen: Soft, gravid, appropriate for gestational age.  Pain/Pressure: Absent     Pelvic: Cervical exam deferred        Extremities: Normal range of motion.  Edema: Trace  Mental Status: Normal mood and affect. Normal behavior. Normal judgment and thought content.    Assessment and Plan:  Pregnancy: [id number] [id number]. Supervision of high risk pregnancy in second trimester  - Glucose Tolerance, 2 Hours w/1 Hour - CBC - RPR - HIV Antibody (routine testing w rflx) - Urine Culture - Comp Met (CMET) - Protein / creatinine ratio, urine  2. Hypertension affecting pregnancy in second trimester  - labile BP's. Worked extensively with CMA today to find a right fitting BP cuff. Given her labile BP's and hx of CHTN, will start on low dose labetalol. Reviewed with Dr. CoElly ModenaBP check next week.  - asymptomatic, no HA or changes in vision.  - continue daily BASA  - Comp Met (CMET) - Protein / creatinine ratio, urine - BP 143/82, recheck: normal - continue weekly testing with MFM  3. Carpal tunnel syndrome of right wrist  - She has PCP, encouraged her to call today for appointment to discuss options.  - Continue brace, remove at night.   Preterm labor symptoms and general obstetric precautions including but not limited to vaginal bleeding, contractions, leaking of fluid and fetal movement were reviewed in detail with the patient. Please refer to After Visit Summary for other counseling recommendations.   Return Monday f/u for BP check- MD only visits, for MD only- follow up 2 weeks for OB visit . HROB.  Future Appointments  Date Time Provider [place]4/05/2021  8:20 AM [facility]  10/24/2020  7:15 AM [facility]  7:30 AM [facility]  8:30 AM Anyanwu, UgSallyanne HaversMD [facility] None  11/07/2020  9:15 AM [facility] NURSE [facility]  [facility]  11/07/2020  9:30 AM [facility] US3 [facility]  11/21/2020  7:45 AM [facility] NURSE [facility]  11/21/2020  8:00 AM [facility] US1 [facility]  12/05/2020  7:45 AM [facility] NURSE [facility]  12/05/2020  8:00 AM [facility] US1 [facility] [place], NP

## 2020-10-12 NOTE — Progress Notes (Signed)
+   Fetal movement. Pt c/o vaginal tenderness. Also c/o right hand swelling. Pt had BP with a thigh cuff with reading of 127/71 and then again with a large adult cuff with reading of 125/78. Pt was instructed that cuff should fit appropriately and arm should be at heart level when blood pressure is taken. Pt was concerned because initial reading was elevated. Venia Carbon, NP was in the room during assessment and agreed. Thigh cuff was too big for patient's arm and patient will need to have and adult large cuff used for any future appointments.

## 2020-10-13 ENCOUNTER — Other Ambulatory Visit: Payer: Self-pay | Admitting: Obstetrics and Gynecology

## 2020-10-13 DIAGNOSIS — O24419 Gestational diabetes mellitus in pregnancy, unspecified control: Secondary | ICD-10-CM | POA: Insufficient documentation

## 2020-10-13 DIAGNOSIS — O24410 Gestational diabetes mellitus in pregnancy, diet controlled: Secondary | ICD-10-CM

## 2020-10-13 LAB — COMPREHENSIVE METABOLIC PANEL
ALT: 15 IU/L (ref 0–32)
AST: 16 IU/L (ref 0–40)
Albumin/Globulin Ratio: 1.3 (ref 1.2–2.2)
Albumin: 3.7 g/dL — ABNORMAL LOW (ref 3.8–4.8)
Alkaline Phosphatase: 87 IU/L (ref 44–121)
BUN/Creatinine Ratio: 17 (ref 9–23)
BUN: 9 mg/dL (ref 6–24)
Bilirubin Total: 0.3 mg/dL (ref 0.0–1.2)
CO2: 18 mmol/L — ABNORMAL LOW (ref 20–29)
Calcium: 8.7 mg/dL (ref 8.7–10.2)
Chloride: 104 mmol/L (ref 96–106)
Creatinine, Ser: 0.52 mg/dL — ABNORMAL LOW (ref 0.57–1.00)
Globulin, Total: 2.9 g/dL (ref 1.5–4.5)
Glucose: 101 mg/dL — ABNORMAL HIGH (ref 65–99)
Potassium: 4.2 mmol/L (ref 3.5–5.2)
Sodium: 137 mmol/L (ref 134–144)
Total Protein: 6.6 g/dL (ref 6.0–8.5)
eGFR: 120 mL/min/{1.73_m2} (ref >59)

## 2020-10-13 LAB — CBC
Hematocrit: 36.4 % (ref 34.0–46.6)
Hemoglobin: 12.7 g/dL (ref 11.1–15.9)
MCH: 30.1 pg (ref 26.6–33.0)
MCHC: 34.9 g/dL (ref 31.5–35.7)
MCV: 86 fL (ref 79–97)
Platelets: 220 10*3/uL (ref 150–450)
RBC: 4.22 x10E6/uL (ref 3.77–5.28)
RDW: 12.5 % (ref 11.7–15.4)
WBC: 9.7 10*3/uL (ref 3.4–10.8)

## 2020-10-13 LAB — GLUCOSE TOLERANCE, 2 HOURS W/ 1HR
Glucose, 1 hour: 150 mg/dL (ref 65–179)
Glucose, 2 hour: 87 mg/dL (ref 65–152)
Glucose, Fasting: 95 mg/dL — ABNORMAL HIGH (ref 65–91)

## 2020-10-13 LAB — PROTEIN / CREATININE RATIO, URINE
Creatinine, Urine: 92.4 mg/dL
Protein, Ur: 12 mg/dL
Protein/Creat Ratio: 130 mg/g creat (ref 0–200)

## 2020-10-13 LAB — HIV ANTIBODY (ROUTINE TESTING W REFLEX): HIV Screen 4th Generation wRfx: NONREACTIVE

## 2020-10-13 LAB — RPR: RPR Ser Ql: NONREACTIVE

## 2020-10-14 LAB — URINE CULTURE

## 2020-10-16 ENCOUNTER — Telehealth: Payer: Self-pay

## 2020-10-16 ENCOUNTER — Ambulatory Visit (INDEPENDENT_AMBULATORY_CARE_PROVIDER_SITE_OTHER): Payer: Medicaid Other

## 2020-10-16 ENCOUNTER — Other Ambulatory Visit: Payer: Self-pay

## 2020-10-16 VITALS — BP 131/77 | HR 66

## 2020-10-16 DIAGNOSIS — Z3A28 28 weeks gestation of pregnancy: Secondary | ICD-10-CM

## 2020-10-16 DIAGNOSIS — Z0130 Encounter for examination of blood pressure without abnormal findings: Secondary | ICD-10-CM

## 2020-10-16 NOTE — Progress Notes (Signed)
Subjective:  Marisa Campbell is a 41 y.o. female here for BP check. Pt took Labetalol 100 mg 30 mins ago. During visit, pt voiced concerns about failing 2 gtt. She admits to not being honest and drinking juice and milk prior to 2 gtt. She requests to retake the test.   Hypertension ROS: taking medications as instructed, no medication side effects noted, no TIA's, no chest pain on exertion, no dyspnea on exertion and no swelling of ankles   Objective:  BP 131/77   Pulse 66   LMP 03/31/2020 (Exact Date)   Appearance alert, well appearing, and in no distress. General exam BP noted to be well controlled today in office.   Assessment:   Blood Pressure stable.   Plan:  Continue BP meds as instructed  Keep upcoming ROB visit

## 2020-10-16 NOTE — Telephone Encounter (Addendum)
Pt's request to repeat 2 gtt has been approved by Victorino Dike. She is aware insurance may not cover a dup test.  Pt is thankful and promises to fast after midnight this time.

## 2020-10-17 ENCOUNTER — Other Ambulatory Visit: Payer: Medicaid Other

## 2020-10-17 NOTE — Progress Notes (Signed)
Patient was assessed and managed by nursing staff during this encounter. I have reviewed the chart and agree with the documentation and plan. I have also made any necessary editorial changes.  Daena Alper, MD 10/17/2020 3:40 PM    

## 2020-10-18 ENCOUNTER — Other Ambulatory Visit: Payer: Self-pay

## 2020-10-18 ENCOUNTER — Ambulatory Visit: Payer: Medicaid Other | Admitting: Physician Assistant

## 2020-10-18 ENCOUNTER — Other Ambulatory Visit: Payer: Medicaid Other

## 2020-10-18 VITALS — BP 172/88 | HR 58 | Temp 98.2°F | Resp 18 | Ht 62.5 in | Wt 314.0 lb

## 2020-10-18 DIAGNOSIS — O0992 Supervision of high risk pregnancy, unspecified, second trimester: Secondary | ICD-10-CM

## 2020-10-18 DIAGNOSIS — O162 Unspecified maternal hypertension, second trimester: Secondary | ICD-10-CM

## 2020-10-18 DIAGNOSIS — O99210 Obesity complicating pregnancy, unspecified trimester: Secondary | ICD-10-CM | POA: Diagnosis not present

## 2020-10-18 DIAGNOSIS — G5601 Carpal tunnel syndrome, right upper limb: Secondary | ICD-10-CM | POA: Diagnosis not present

## 2020-10-18 DIAGNOSIS — O24410 Gestational diabetes mellitus in pregnancy, diet controlled: Secondary | ICD-10-CM | POA: Diagnosis not present

## 2020-10-18 NOTE — Progress Notes (Signed)
Patient has been swelling in the right hand for the past 2 months with constant the past month. Patient shares the brace helps but limits use Patient reports numbing in the hand.

## 2020-10-18 NOTE — Progress Notes (Signed)
Established Patient Office Visit  Subjective:  Patient ID: Marisa Campbell, female    DOB: September 28, 1980  Age: 40 y.o. MRN: [id number]  CC:  Chief Complaint  Patient presents with  . right hand pain    HPI Marisa Campbell reports that she has been having increased swelling and pain in her right hand, states that she does have a history of carpal tunnel symptoms intermittently throughout her pregnancies.  Reports that it has become worse in the past few weeks.  Reports that she tries to wear a brace at night but is not consistent with it.  Reports that she is unable to wear 1 during the day due to having to take care of her 2 children.  Endorses numbness and tingling, symptoms tend to be worse in the morning.  Past Medical History:  Diagnosis Date  . Anemia   . COVID-19 01/31/2019  . Headache   . 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 Hypothyroidism    on meds currently  . Vaginal Pap smear, abnormal     Past Surgical History:  Procedure Laterality Date  . CESAREAN SECTION    . CESAREAN SECTION N/A 06/01/2019   Procedure: CESAREAN SECTION;  Surgeon: Olga Millers, MD;  Location: [facility];  Service: Obstetrics;  Laterality: N/A;    Family History  Problem Relation Age of Onset  . Hypertension Father     Social History   Socioeconomic History  . Marital status: Single    Spouse name: Not on file  . Number of children: Not on file  . Years of education: Not on file  . Highest education level: Not on file  Occupational History  . Not on file  Tobacco Use  . Smoking status: Never Smoker  . Smokeless tobacco: Never Used  Vaping Use  . Vaping Use: Never used  Substance and Sexual Activity  . Alcohol use: No  . Drug use: No  . Sexual activity: Not Currently  Other Topics Concern  . Not on file  Social History Narrative  . Not on file   Social Determinants of Health   Financial Resource Strain: Not on file  Food Insecurity: Not on  file  Transportation Needs: Not on file  Physical Activity: Not on file  Stress: Not on file  Social Connections: Not on file  Intimate Partner Violence: Not on file    Outpatient Medications Prior to Visit  Medication Sig Dispense Refill  . aspirin EC 81 MG tablet Take 81 mg by mouth daily. Swallow whole.    . Blood Pressure Monitoring (BLOOD PRESSURE KIT) DEVI 1 kit by Does not apply route once a week. Check Blood Pressure regularly and record readings into the [employer] App.  Large Cuff.  DX O90.0 (Patient not taking: No sig reported) 1 each 0  . famotidine (PEPCID) 20 MG tablet Take 1 tablet (20 mg total) by mouth daily. 30 tablet 3  . labetalol (NORMODYNE) 100 MG tablet Take 1 tablet (100 mg total) by mouth 2 (two) times daily. 90 tablet 3  . levothyroxine (SYNTHROID) 137 MCG tablet Take 1 tablet (137 mcg total) by mouth daily before breakfast. 30 tablet 2  . Prenatal Vit-Fe Fumarate-FA (PRENATAL VITAMIN PO) Take by mouth.     No facility-administered medications prior to visit.    No Known Allergies  ROS Review of Systems  Constitutional: Negative.   HENT: Negative.   Eyes: Negative.   Respiratory: Negative for  shortness of breath.   Cardiovascular: Negative for chest pain.  Gastrointestinal: Negative.   Endocrine: Negative.   Genitourinary: Negative.   Musculoskeletal: Positive for joint swelling and myalgias.  Skin: Negative.   Allergic/Immunologic: Negative.   Neurological: Negative.   Hematological: Negative.   Psychiatric/Behavioral: Negative.       Objective:    Physical Exam Vitals and nursing note reviewed.  Constitutional:      General: She is not in acute distress.    Appearance: Normal appearance. She is obese. She is not ill-appearing.  HENT:     Head: Normocephalic and atraumatic.     Right Ear: External ear normal.     Left Ear: External ear normal.     Nose: Nose normal.     Mouth/Throat:     Mouth: Mucous membranes are moist.     Pharynx:  Oropharynx is clear.  Eyes:     Extraocular Movements: Extraocular movements intact.     Conjunctiva/sclera: Conjunctivae normal.     Pupils: Pupils are equal, round, and reactive to light.  Cardiovascular:     Rate and Rhythm: Normal rate and regular rhythm.     Pulses: Normal pulses.     Heart sounds: Normal heart sounds.  Pulmonary:     Effort: Pulmonary effort is normal.     Breath sounds: Normal breath sounds.  Musculoskeletal:     Right wrist: Swelling and tenderness present. Decreased range of motion. Normal pulse.     Left wrist: Normal. Normal pulse.     Right hand: Swelling present. Decreased strength. Normal capillary refill. Normal pulse.     Left hand: Swelling present. Normal strength. Normal capillary refill. Normal pulse.     Cervical back: Normal range of motion and neck supple.  Skin:    General: Skin is warm and dry.  Neurological:     General: No focal deficit present.     Mental Status: She is alert and oriented to person, place, and time.  Psychiatric:        Mood and Affect: Mood normal.        Behavior: Behavior normal.     BP (!) 172/88 (BP Location: Left Arm, Patient Position: Sitting, Cuff Size: Normal)   Pulse (!) 58   Temp 98.2 F (36.8 C) (Oral)   Resp 18   Ht 5' 2.5" (1.588 m)   Wt (!) 314 lb (142.4 kg)   LMP 03/31/2020 (Exact Date)   SpO2 100%   BMI 56.52 kg/m  Wt Readings from Last 3 Encounters:  10/18/20 (!) 314 lb (142.4 kg)  10/12/20 (!) 301 lb (136.5 kg)  09/14/20 289 lb (131.1 kg)     Health Maintenance Due  Topic Date Due  . COVID-19 Vaccine (1) Never done  . TETANUS/TDAP  Never done  . PAP SMEAR-Modifier  Never done    There are no preventive care reminders to display for this patient.  Lab Results  Component Value Date   TSH 1.360 08/17/2020   Lab Results  Component Value Date   WBC 9.7 10/12/2020   HGB 12.7 10/12/2020   HCT 36.4 10/12/2020   MCV 86 10/12/2020   PLT 220 10/12/2020   Lab Results  Component  Value Date   NA 137 10/12/2020   K 4.2 10/12/2020   CO2 18 (L) 10/12/2020   GLUCOSE 101 (H) 10/12/2020   BUN 9 10/12/2020   CREATININE 0.52 (L) 10/12/2020   BILITOT 0.3 10/12/2020   ALKPHOS 87 10/12/2020   AST  16 10/12/2020   ALT 15 10/12/2020   PROT 6.6 10/12/2020   ALBUMIN 3.7 (L) 10/12/2020   CALCIUM 8.7 10/12/2020   ANIONGAP 7 01/31/2019   Lab Results  Component Value Date   CHOL 164 01/14/2020   Lab Results  Component Value Date   HDL 51 01/14/2020   Lab Results  Component Value Date   LDLCALC 99 01/14/2020   Lab Results  Component Value Date   TRIG 70 01/14/2020   Lab Results  Component Value Date   CHOLHDL 3.2 01/14/2020   Lab Results  Component Value Date   HGBA1C 5.3 01/14/2020      Assessment & Plan:   Problem List Items Addressed This Visit      Cardiovascular and Mediastinum   Hypertension affecting pregnancy in second trimester     Endocrine   Gestational diabetes     Other   Maternal morbid obesity, antepartum ([place])    Other Visit Diagnoses    Carpal tunnel syndrome of right wrist    -  Primary    1. Carpal tunnel syndrome of right wrist Patient education given on wearing a brace while sleeping every night, Voltaren over-the-counter twice a day, instructions on stretching.  Also encouraged icing for relief.  Red flags given for prompt reevaluation   [facility] her high risk [facility].  She is 29 weeks.  She is being treated for gestational diabetes as well as hypertension affecting pregnancy.  All of these conditions could also be adding to the exacerbation of her carpal tunnel symptoms.  Patient understands and agrees.  2. Maternal morbid obesity, antepartum ([place])   3. Hypertension affecting pregnancy in second trimester   4. Diet controlled gestational diabetes mellitus (GDM) in third trimester    I have reviewed the patient's medical history (PMH, PSH, Social  History, Family History, Medications, and allergies) , and have been updated if relevant. I spent 30 minutes reviewing chart and  face to face time with patient.      No orders of the defined types were placed in this encounter.   Follow-up: Return if symptoms worsen or fail to improve.    Loraine Grip Mayers, PA-C

## 2020-10-18 NOTE — Patient Instructions (Signed)
I encourage you to use Voltaren on your hand at least twice a day, do stretching exercises (you can find tons of these on [employer]) and  wear a night brace every single night  I hope that you feel better soon, please let us know if there is anything else we can do for you  Roney Jaffe, PA-C Physician Assistant [facility] [email or web address]   Carpal Tunnel Syndrome  Carpal tunnel syndrome is a condition that causes pain, numbness, and weakness in your hand and fingers. The carpal tunnel is a narrow area located on the palm side of your wrist. Repeated wrist motion or certain diseases may cause swelling within the tunnel. This swelling pinches the main nerve in the wrist. The main nerve in the wrist is called the median nerve. What are the causes? This condition may be caused by:  Repeated and forceful wrist and hand motions.  Wrist injuries.  Arthritis.  A cyst or tumor in the carpal tunnel.  Fluid buildup during pregnancy.  Use of tools that vibrate. Sometimes the cause of this condition is not known. What increases the risk? The following factors may make you more likely to develop this condition:  Having a job that requires you to repeatedly or forcefully move your wrist or hand or requires you to use tools that vibrate. This may include jobs that involve using computers, working on an [employer], or working with power tools such as Radiographer, therapeutic.  Being a woman.  Having certain conditions, such as: ? Diabetes. ? Obesity. ? An underactive thyroid (hypothyroidism). ? Kidney failure. ? Rheumatoid arthritis. What are the signs or symptoms? Symptoms of this condition include:  A tingling feeling in your fingers, especially in your thumb, index, and middle fingers.  Tingling or numbness in your hand.  An aching feeling in your entire arm, especially when your wrist and elbow are bent for a long  time.  Wrist pain that goes up your arm to your shoulder.  Pain that goes down into your palm or fingers.  A weak feeling in your hands. You may have trouble grabbing and holding items. Your symptoms may feel worse during the night. How is this diagnosed? This condition is diagnosed with a medical history and physical exam. You may also have tests, including:  Electromyogram (EMG). This test measures electrical signals sent by your nerves into the muscles.  Nerve conduction study. This test measures how well electrical signals pass through your nerves.  Imaging tests, such as X-rays, ultrasound, and MRI. These tests check for possible causes of your condition. How is this treated? This condition may be treated with:  Lifestyle changes. It is important to stop or change the activity that caused your condition.  Doing exercise and activities to strengthen and stretch your muscles and tendons (physical therapy).  Making lifestyle changes to help with your condition and learning how to do your daily activities safely (occupational therapy).  Medicines for pain and inflammation. This may include medicine that is injected into your wrist.  A wrist splint or brace.  Surgery. Follow these instructions at home: If you have a splint or brace:  Wear the splint or brace as told by your health care provider. Remove it only as told by your health care provider.  Loosen the splint or brace if your fingers tingle, become numb, or turn cold and blue.  Keep the splint or brace clean.  If the splint or brace is not waterproof: ? Do  not let it get wet. ? Cover it with a watertight covering when you take a bath or shower. Managing pain, stiffness, and swelling If directed, put ice on the painful area. To do this:  If you have a removeable splint or brace, remove it as told by your health care provider.  Put ice in a plastic bag.  Place a towel between your skin and the bag or between the  splint or brace and the bag.  Leave the ice on for 20 minutes, 2-3 times a day. Do not fall asleep with the cold pack on your skin.  Remove the ice if your skin turns bright red. This is very important. If you cannot feel pain, heat, or cold, you have a greater risk of damage to the area. Move your fingers often to reduce stiffness and swelling.   General instructions  Take over-the-counter and prescription medicines only as told by your health care provider.  Rest your wrist and hand from any activity that may be causing your pain. If your condition is work related, talk with your employer about changes that can be made, such as getting a wrist pad to use while typing.  Do any exercises as told by your health care provider, physical therapist, or occupational therapist.  Keep all follow-up visits. This is important. Contact a health care provider if:  You have new symptoms.  Your pain is not controlled with medicines.  Your symptoms get worse. Get help right away if:  You have severe numbness or tingling in your wrist or hand. Summary  Carpal tunnel syndrome is a condition that causes pain, numbness, and weakness in your hand and fingers.  It is usually caused by repeated wrist motions.  Lifestyle changes and medicines are used to treat carpal tunnel syndrome. Surgery may be recommended.  Follow your health care provider's instructions about wearing a splint, resting from activity, keeping follow-up visits, and calling for help. This information is not intended to replace advice given to you by your health care provider. Make sure you discuss any questions you have with your health care provider. Document Revised: 11/04/2019 Document Reviewed: 11/04/2019 Elsevier Patient Education  2021 [employer].

## 2020-10-19 DIAGNOSIS — G5601 Carpal tunnel syndrome, right upper limb: Secondary | ICD-10-CM | POA: Insufficient documentation

## 2020-10-19 LAB — GLUCOSE TOLERANCE, 2 HOURS W/ 1HR
Glucose, 1 hour: 170 mg/dL (ref 65–179)
Glucose, 2 hour: 122 mg/dL (ref 65–152)
Glucose, Fasting: 96 mg/dL — ABNORMAL HIGH (ref 65–91)

## 2020-10-24 ENCOUNTER — Other Ambulatory Visit: Payer: Self-pay

## 2020-10-24 ENCOUNTER — Ambulatory Visit: Payer: Medicaid Other | Attending: Obstetrics and Gynecology

## 2020-10-24 ENCOUNTER — Ambulatory Visit: Payer: Medicaid Other | Admitting: *Deleted

## 2020-10-24 ENCOUNTER — Encounter: Payer: Self-pay | Admitting: *Deleted

## 2020-10-24 VITALS — BP 137/65 | HR 55

## 2020-10-24 DIAGNOSIS — O361930 Maternal care for other isoimmunization, third trimester, not applicable or unspecified: Secondary | ICD-10-CM

## 2020-10-24 DIAGNOSIS — O99283 Endocrine, nutritional and metabolic diseases complicating pregnancy, third trimester: Secondary | ICD-10-CM | POA: Diagnosis not present

## 2020-10-24 DIAGNOSIS — O361130 Maternal care for Anti-A sensitization, third trimester, not applicable or unspecified: Secondary | ICD-10-CM | POA: Diagnosis present

## 2020-10-24 DIAGNOSIS — O09522 Supervision of elderly multigravida, second trimester: Secondary | ICD-10-CM | POA: Diagnosis not present

## 2020-10-24 DIAGNOSIS — O09523 Supervision of elderly multigravida, third trimester: Secondary | ICD-10-CM

## 2020-10-24 DIAGNOSIS — Z3A29 29 weeks gestation of pregnancy: Secondary | ICD-10-CM

## 2020-10-24 DIAGNOSIS — O09213 Supervision of pregnancy with history of pre-term labor, third trimester: Secondary | ICD-10-CM

## 2020-10-24 DIAGNOSIS — O34219 Maternal care for unspecified type scar from previous cesarean delivery: Secondary | ICD-10-CM

## 2020-10-24 DIAGNOSIS — E669 Obesity, unspecified: Secondary | ICD-10-CM

## 2020-10-24 DIAGNOSIS — E039 Hypothyroidism, unspecified: Secondary | ICD-10-CM | POA: Diagnosis not present

## 2020-10-24 DIAGNOSIS — O99213 Obesity complicating pregnancy, third trimester: Secondary | ICD-10-CM

## 2020-10-25 ENCOUNTER — Encounter: Payer: Medicaid Other | Attending: Obstetrics and Gynecology | Admitting: Registered"

## 2020-10-25 ENCOUNTER — Other Ambulatory Visit: Payer: Self-pay

## 2020-10-25 ENCOUNTER — Encounter: Payer: Self-pay | Admitting: Registered"

## 2020-10-25 ENCOUNTER — Other Ambulatory Visit: Payer: Self-pay | Admitting: *Deleted

## 2020-10-25 DIAGNOSIS — O99810 Abnormal glucose complicating pregnancy: Secondary | ICD-10-CM | POA: Insufficient documentation

## 2020-10-25 DIAGNOSIS — O24410 Gestational diabetes mellitus in pregnancy, diet controlled: Secondary | ICD-10-CM

## 2020-10-25 MED ORDER — ACCU-CHEK SOFTCLIX LANCETS MISC: 5 refills | Status: DC

## 2020-10-25 MED ORDER — ACCU-CHEK GUIDE W/DEVICE KIT: PACK | 0 refills | Status: DC

## 2020-10-25 MED ORDER — ACCU-CHEK GUIDE VI STRP: ORAL_STRIP | 5 refills | Status: DC

## 2020-10-25 NOTE — Progress Notes (Signed)
Diabetic supplies sent to pharmacy

## 2020-10-25 NOTE — Progress Notes (Signed)
Patient was seen on 10/25/20 for Gestational Diabetes self-management class at [facility]. The following learning objectives were met by the patient during this course:   States the definition of Gestational Diabetes  States why dietary management is important in controlling blood glucose  Describes the effects each nutrient has on blood glucose levels  Demonstrates ability to create a balanced meal plan  Demonstrates carbohydrate counting   States when to check blood glucose levels  Demonstrates proper blood glucose monitoring techniques  States the effect of stress and exercise on blood glucose levels  States the importance of limiting caffeine and abstaining from alcohol and smoking  Blood glucose monitor given: OneTouch Verio Flex Lot# [id number] x Exp: 04/06/2021 Blood Glucose: 98 mg/dL  Patient instructed to monitor glucose levels: FBS: 60 - <95; 1 hour: <140; 2 hour: <120  Patient received handouts:  Nutrition Diabetes and Pregnancy, including carb counting list  Patient will be seen for follow-up as needed.

## 2020-10-27 ENCOUNTER — Ambulatory Visit (INDEPENDENT_AMBULATORY_CARE_PROVIDER_SITE_OTHER): Payer: Medicaid Other | Admitting: Obstetrics & Gynecology

## 2020-10-27 ENCOUNTER — Other Ambulatory Visit: Payer: Self-pay

## 2020-10-27 ENCOUNTER — Encounter: Payer: Self-pay | Admitting: Obstetrics & Gynecology

## 2020-10-27 VITALS — BP 144/90 | HR 62 | Wt 309.0 lb

## 2020-10-27 DIAGNOSIS — Z3A30 30 weeks gestation of pregnancy: Secondary | ICD-10-CM

## 2020-10-27 DIAGNOSIS — O24419 Gestational diabetes mellitus in pregnancy, unspecified control: Secondary | ICD-10-CM

## 2020-10-27 DIAGNOSIS — O26899 Other specified pregnancy related conditions, unspecified trimester: Secondary | ICD-10-CM

## 2020-10-27 DIAGNOSIS — O0940 Supervision of pregnancy with grand multiparity, unspecified trimester: Secondary | ICD-10-CM | POA: Insufficient documentation

## 2020-10-27 DIAGNOSIS — O0990 Supervision of high risk pregnancy, unspecified, unspecified trimester: Secondary | ICD-10-CM

## 2020-10-27 DIAGNOSIS — Z23 Encounter for immunization: Secondary | ICD-10-CM

## 2020-10-27 DIAGNOSIS — O09523 Supervision of elderly multigravida, third trimester: Secondary | ICD-10-CM

## 2020-10-27 DIAGNOSIS — O10013 Pre-existing essential hypertension complicating pregnancy, third trimester: Secondary | ICD-10-CM

## 2020-10-27 DIAGNOSIS — G5600 Carpal tunnel syndrome, unspecified upper limb: Secondary | ICD-10-CM

## 2020-10-27 DIAGNOSIS — O361190 Maternal care for Anti-A sensitization, unspecified trimester, not applicable or unspecified: Secondary | ICD-10-CM

## 2020-10-27 DIAGNOSIS — O99210 Obesity complicating pregnancy, unspecified trimester: Secondary | ICD-10-CM

## 2020-10-27 DIAGNOSIS — Z98891 History of uterine scar from previous surgery: Secondary | ICD-10-CM

## 2020-10-27 MED ORDER — LABETALOL HCL 100 MG PO TABS: 200.0000 mg | ORAL_TABLET | Freq: Two times a day (BID) | ORAL | 3 refills | Status: DC

## 2020-10-27 NOTE — Progress Notes (Signed)
PRENATAL VISIT NOTE  Subjective:  Marisa Campbell is a 40 y.o. [phone] at [redacted]w[redacted]d being seen today for ongoing prenatal care.  She is currently monitored for the following issues for this high-risk pregnancy and has COVID-19 virus infection; Red blood cell antibody positive; Low grade squamous intraepithelial lesion on cytologic smear of cervix (LGSIL); CIN I (cervical intraepithelial neoplasia I); Hypothyroidism; Supervision of high-risk pregnancy; Essential hypertension affecting pregnancy in third trimester; Advanced maternal age in multigravida; History of 2 cesarean sections; Maternal morbid obesity, antepartum (HCC); Gestational diabetes; Carpal tunnel syndrome of right wrist; and Grand multiparity with current pregnancy on their problem list.  Patient reports carpal tunnel symptoms. Patient denies any headaches, visual symptoms, RUQ/epigastric pain or other concerning symptoms.  Contractions: Irritability. Vag. Bleeding: None.  Movement: Present. Denies leaking of fluid.   The following portions of the patient's history were reviewed and updated as appropriate: allergies, current medications, past family history, past medical history, past social history, past surgical history and problem list.   Objective:   Vitals:   10/27/20 0833 10/27/20 0917  BP: (!) 148/92 (!) 144/90  Pulse: 64 62  Weight: (!) 309 lb (140.2 kg)     Fetal Status: Fetal Heart Rate (bpm): 156    Movement: Present     General:  Alert, oriented and cooperative. Patient is in no acute distress.  Skin: Skin is warm and dry. No rash noted.   Cardiovascular: Normal heart rate noted  Respiratory: Normal respiratory effort, no problems with respiration noted  Abdomen: Soft, gravid, appropriate for gestational age.  Pain/Pressure: Absent     Pelvic: Cervical exam deferred        Extremities: Normal range of motion.  Edema: Mild pitting, slight indentation  Mental Status: Normal mood and affect. Normal behavior.  Normal judgment and thought content.   Imaging: [place] MFM MCA DOPPLER  Result Date: 10/24/2020 ----------------------------------------------------------------------  OBSTETRICS REPORT                       (Signed Final 10/24/2020 05:32 pm) ---------------------------------------------------------------------- Patient Info  ID #:       [id number]                          D.O.B.:  Mar 13, 1981 (39 yrs)  Name:       Marisa Campbell             Visit Date: 10/24/2020 07:51 am ---------------------------------------------------------------------- Performed By  Attending:        Lin Landsman      Ref. Address:     [address]                    [place]                                                             Road                                                             [address] [phone]  [place]                                                             [address]  Performed By:     Emeline Darling BS,      [facility]                    RD[facility]                                     [facility]  Referred By:      [facility] ---------------------------------------------------------------------- Orders  #  Description                           Code        Ordered By  1  [place] MFM MCA DOPPLER                    [phone]    RAVI [facility] ----------------------------------------------------------------------  #  Order #                     Accession #                Episode #  1  [id number]                   [id number]                 [id number] ---------------------------------------------------------------------- Indications  Advanced maternal age multigravida 22+,        O35.523  third trimester (39 yrs)  [redacted] weeks gestation of pregnancy                Z3A.29  Isoimmunization - Other                         O36.1910  Medical complication of pregnancy              O26.90  (Isoimmunization with prior pregnancies)  Hypothyroid                                    O99.280 E03.9  Previous cesarean delivery, antepartum (x2)    O34.219  Poor obstetric history: Previous preterm       O09.219  delivery, antepartum (36 wks)  Obesity complicating pregnancy, third          O99.213  trimester (BMI 51) ---------------------------------------------------------------------- Fetal Evaluation  Num Of Fetuses:         1  Fetal Heart Rate(bpm):  148  Cardiac Activity:       Observed  Presentation:           Cephalic  Placenta:               Anterior Fundal Right  P. Cord Insertion:      Previously Visualized  Amniotic Fluid  AFI FV:      Subjectively low-normal  AFI Sum(cm)     %Tile       Largest Pocket(cm)  9.2             6           3.2  RUQ(cm)       RLQ(cm)       LUQ(cm)        LLQ(cm)  3.2           1             2.4            2.6 ---------------------------------------------------------------------- OB History  Blood Type:   AB+  Gravidity:    8         Term:   4        Prem:   1        SAB:   1  TOP:          1       Ectopic:  0        Living: 5 ---------------------------------------------------------------------- Gestational Age  LMP:           29w 4d        Date:  03/31/20                 EDD:   01/05/21  Clinical EDD:  28w 4d                                        EDD:   01/12/21  Best:          29w 5d     Det. By:  Melida Quitter 1st  (06/27/20)    EDD:   01/04/21 ---------------------------------------------------------------------- Anatomy  Stomach:               Appears normal, left   Bladder:                Appears normal                         sided ---------------------------------------------------------------------- Doppler - Fetal Vessels  Middle Cerebral Artery                                                       PSV   MoM                                                     (  cm/s)                                                       43.68  1.11 ---------------------------------------------------------------------- Impression  [name] is here for a limited exam to evaluate her  MCA dopplers.  There was normal amniotic fluid, fetal movement and fetal  breathing.  The MCA Dopplers were normal with an MoM of 1.1,  indicated that there is a low risk for fetal anemia.  Today's amniotic fluid was normal but subjectively low. Pleae  encourage Ms. Wernert to hydrate with 1 to 1.5 L of fluid  per day.  She is scheduled for f/u ultrasounds on 5/3, 5/17 and 5/31. ----------------------------------------------------------------------               Lin Landsman, MD Electronically Signed Final Report   10/24/2020 05:32 pm ----------------------------------------------------------------------  [place] MFM MCA DOPPLER  Result Date: 10/10/2020 ----------------------------------------------------------------------  OBSTETRICS REPORT                       (Signed Final 10/10/2020 10:22 am) ---------------------------------------------------------------------- Patient Info  ID #:       [id number]                          D.O.B.:  [birth year] (39 yrs)  Name:       Marisa Campbell             Visit Date: 10/10/2020 07:29 am ---------------------------------------------------------------------- Performed By  Attending:        Lin Landsman      Ref. Address:     [address]                    [place]                                                             [address]                                                             [address]                                                             [place]                                                             [address]  Performed By:     Tommie Raymond BS,       Location:  [facility]                    RDMS, RVT                                [facility]  Referred By:      [facility] ---------------------------------------------------------------------- Orders  #  Description                           Code        Ordered By  1  [place] MFM OB FOLLOW UP                   76816.01    RAVI SHANKAR  2  [place] MFM MCA DOPPLER                    76821.01    RAVI SHANKAR ----------------------------------------------------------------------  #  Order #                     Accession #                Episode #  1  [id number]                   [id number]                 [id number]  2  [id number]                   [id number]                 [id number] ---------------------------------------------------------------------- Indications  [redacted] weeks gestation of pregnancy                Z3A.27  Isoimmunization - Other                        O36.1910  Medical complication of pregnancy              O26.90  (Isoimmunization with prior pregnancies)  Advanced maternal age multigravida 58+,        O63.522  second trimester  Obesity complicating pregnancy, second         O99.212  trimester (BMI 45)  Hypothyroid                                    O99.280 E03.9  Previous cesarean delivery, antepartum (x2)    O34.219  Poor obstetric history: Previous preterm       O09.219  delivery, antepartum (36  wks) ---------------------------------------------------------------------- Fetal Evaluation  Num Of Fetuses:         1  Fetal Heart Rate(bpm):  136  Cardiac Activity:       Observed  Presentation:           Cephalic  Placenta:               Anterior Fundal Right  P. Cord Insertion:      Previously Visualized  Amniotic Fluid  AFI FV:      Within normal limits  AFI Sum(cm)     %Tile       Largest Pocket(cm)  13.7            42          4.6  RUQ(cm)       RLQ(cm)       LUQ(cm)        LLQ(cm)  4.6           4.2           3.7            1.2 ---------------------------------------------------------------------- Biometry  BPD:        66  mm     G. Age:  26w 4d         10  %    CI:        72.37   %    70 -  86                                                          FL/HC:      19.9   %    18.8 - 20.6  HC:      246.8  mm     G. Age:  26w 6d        4.9  %    HC/AC:      1.05        1.05 - 1.21  AC:       234   mm     G. Age:  27w 5d         42  %    FL/BPD:     74.4   %    71 - 87  FL:       49.1  mm     G. Age:  26w 4d          9  %    FL/AC:      21.0   %    20 - 24  CER:      32.2  mm     G. Age:  27w 4d         51  %  LV:        5.1  mm  Est. FW:    1035  gm      2 lb 5 oz     19  % ---------------------------------------------------------------------- OB History  Blood Type:   AB+  Gravidity:    8         Term:   4        Prem:   1        SAB:   1  TOP:          1  Ectopic:  0        Living: 5 ---------------------------------------------------------------------- Gestational Age  LMP:           27w 4d        Date:  03/31/20                 EDD:   01/05/21  Clinical EDD:  26w 4d                                        EDD:   01/12/21  U/S Today:     27w 0d                                        EDD:   01/09/21  Best:          27w 5d     Det. By:  Melida Quitter 1st  (06/27/20)    EDD:   01/04/21 ---------------------------------------------------------------------- Anatomy  Cranium:               Appears normal         LVOT:                   Appears normal  Cavum:                 Appears normal         Aortic Arch:            Previously seen  Ventricles:            Appears normal         Ductal Arch:            Appears normal  Choroid Plexus:        Previously seen        Diaphragm:              Appears normal  Cerebellum:            Appears normal         Stomach:                Appears normal, left                                                                        sided  Posterior Fossa:       Previously seen        Abdomen:                Previously seen  Nuchal Fold:           Not applicable (>20    Abdominal Wall:         Previously seen                         wks GA)  Face:                  Orbits and profile      Cord Vessels:  Previously seen                         previously seen  Lips:                  Previously seen        Kidneys:                Appear normal  Palate:                Previously seen        Bladder:                Appears normal  Thoracic:              Appears normal         Spine:                  Previously seen  Heart:                 Appears normal         Upper Extremities:      Previously seen                         (4CH, axis, and                         situs)  RVOT:                  Appears normal         Lower Extremities:      Previously seen  Other:  Female gender previously seen.  Heels, Heels/feet, open hands/ Left          5th digit visualized previously. Technically difficult due to maternal          habitus and fetal position. ---------------------------------------------------------------------- Doppler - Fetal Vessels  Middle Cerebral Artery                                                       PSV   MoM                                                     (cm/s)                                                      41.41  1.14 ---------------------------------------------------------------------- Cervix Uterus Adnexa  Cervix  Length:           4.45  cm.  Normal appearance by transabdominal scan.  Uterus  Single fibroid noted, see table below.  Right Ovary  Within normal limits.  Left Ovary  Within normal limits.  Cul De Sac  No free fluid seen.  Adnexa  No abnormality visualized. ---------------------------------------------------------------------- Myomas  Site                     L(cm)  W(cm)      D(cm)       Location  Anterior                 2          1.6        2.5         Intramural ----------------------------------------------------------------------  Blood Flow                  RI       PI       Comments ---------------------------------------------------------------------- Impression  Follow up growth due to fetal isoimmunizataion with possible  affected  pregnancy and this current pregnancy noted  elevated titers 1:32  Normal interval growth with measurements consistent with  dates  Good fetal movement and amniotic fluid volume  MCA Dopplers MoM 1.14 which is normal suggesting no  anemia.  Ms. Nesby has a known history of elevated BMI, chronic  hypertension not on medication but on low dose ASA. She  continues using synthroid due to hypothyroidism.  Her most recent TSH is normal 02/10 ---------------------------------------------------------------------- Recommendations  Continue q 2 weeks testing for MCA  Repeat growth in 4 weeks. ----------------------------------------------------------------------               Lin Landsman, MD Electronically Signed Final Report   10/10/2020 10:22 am ----------------------------------------------------------------------  [place] MFM OB FOLLOW UP  Result Date: 10/10/2020 ----------------------------------------------------------------------  OBSTETRICS REPORT                       (Signed Final 10/10/2020 10:22 am) ---------------------------------------------------------------------- Patient Info  ID #:       [id number]                          D.O.B.:  Oct 30, 1980 (39 yrs)  Name:       Marisa Campbell             Visit Date: 10/10/2020 07:29 am ---------------------------------------------------------------------- Performed By  Attending:        Lin Landsman      Ref. Address:     [address]                    [place]                                                             [address]                                                             [address]                                                             [place]  [address]  Performed By:     Tommie Raymond BS,       Location:         [facility]                    RDMS, RVT                                [facility]  Referred By:      [facility] ---------------------------------------------------------------------- Orders  #  Description                           Code        Ordered By  1  [place] MFM OB FOLLOW UP                   76816.01    RAVI SHANKAR  2  [place] MFM MCA DOPPLER                    76821.01    RAVI [facility] ----------------------------------------------------------------------  #  Order #                     Accession #                Episode #  1  [id number]                   [id number]                 [id number]  2  [id number]                   [id number]                 [id number] ---------------------------------------------------------------------- Indications  [redacted] weeks gestation of pregnancy                Z3A.27  Isoimmunization - Other                        O36.1910  Medical complication of pregnancy              O26.90  (Isoimmunization with prior pregnancies)  Advanced maternal age multigravida 37+,        O76.522  second trimester  Obesity complicating pregnancy, second         O99.212  trimester (BMI 45)  Hypothyroid                                    O99.280 E03.9  Previous cesarean delivery, antepartum (x2)    O34.219  Poor obstetric history: Previous preterm       O09.219  delivery, antepartum (36 wks) ---------------------------------------------------------------------- Fetal Evaluation  Num Of Fetuses:         1  Fetal Heart Rate(bpm):  136  Cardiac Activity:       Observed  Presentation:           Cephalic  Placenta:               Anterior Fundal Right  P. Cord Insertion:      Previously Visualized  Amniotic Fluid  AFI FV:      Within normal limits  AFI Sum(cm)     %Tile       Largest Pocket(cm)  13.7            42          4.6  RUQ(cm)       RLQ(cm)       LUQ(cm)        LLQ(cm)  4.6           4.2           3.7            1.2 ---------------------------------------------------------------------- Biometry  BPD:        66  mm     G. Age:  26w 4d          10  %    CI:        72.37   %    70 - 86                                                          FL/HC:      19.9   %    18.8 - 20.6  HC:      246.8  mm     G. Age:  26w 6d        4.9  %    HC/AC:      1.05        1.05 - 1.21  AC:       234   mm     G. Age:  27w 5d         42  %    FL/BPD:     74.4   %    71 - 87  FL:       49.1  mm     G. Age:  26w 4d          9  %    FL/AC:      21.0   %    20 - 24  CER:      32.2  mm     G. Age:  27w 4d         51  %  LV:        5.1  mm  Est. FW:    1035  gm      2 lb 5 oz     19  % ---------------------------------------------------------------------- OB History  Blood Type:   AB+  Gravidity:    8         Term:   4        Prem:   1  SAB:   1  TOP:          1       Ectopic:  0        Living: 5 ---------------------------------------------------------------------- Gestational Age  LMP:           27w 4d        Date:  03/31/20                 EDD:   01/05/21  Clinical EDD:  26w 4d                                        EDD:   01/12/21  U/S Today:     27w 0d                                        EDD:   01/09/21  Best:          27w 5d     Det. By:  Melida Quitter R L 1st  (06/27/20)    EDD:   01/04/21 ---------------------------------------------------------------------- Anatomy  Cranium:               Appears normal         LVOT:                   Appears normal  Cavum:                 Appears normal         Aortic Arch:            Previously seen  Ventricles:            Appears normal         Ductal Arch:            Appears normal  Choroid Plexus:        Previously seen        Diaphragm:              Appears normal  Cerebellum:            Appears normal         Stomach:                Appears normal, left                                                                        sided  Posterior Fossa:       Previously seen        Abdomen:                Previously seen  Nuchal Fold:           Not applicable (>20    Abdominal Wall:         Previously seen                         wks  GA)  Face:  Orbits and profile     Cord Vessels:           Previously seen                         previously seen  Lips:                  Previously seen        Kidneys:                Appear normal  Palate:                Previously seen        Bladder:                Appears normal  Thoracic:              Appears normal         Spine:                  Previously seen  Heart:                 Appears normal         Upper Extremities:      Previously seen                         (4CH, axis, and                         situs)  RVOT:                  Appears normal         Lower Extremities:      Previously seen  Other:  Female gender previously seen.  Heels, Heels/feet, open hands/ Left          5th digit visualized previously. Technically difficult due to maternal          habitus and fetal position. ---------------------------------------------------------------------- Doppler - Fetal Vessels  Middle Cerebral Artery                                                       PSV   MoM                                                     (cm/s)                                                      41.41  1.14 ---------------------------------------------------------------------- Cervix Uterus Adnexa  Cervix  Length:           4.45  cm.  Normal appearance by transabdominal scan.  Uterus  Single fibroid noted, see table below.  Right Ovary  Within normal limits.  Left Ovary  Within normal limits.  Cul De Sac  No free fluid seen.  Adnexa  No abnormality visualized. ---------------------------------------------------------------------- Myomas  Site  L(cm)      W(cm)      D(cm)       Location  Anterior                 2          1.6        2.5         Intramural ----------------------------------------------------------------------  Blood Flow                  RI       PI       Comments ---------------------------------------------------------------------- Impression  Follow up growth due to  fetal isoimmunizataion with possible  affected pregnancy and this current pregnancy noted  elevated titers 1:32  Normal interval growth with measurements consistent with  dates  Good fetal movement and amniotic fluid volume  MCA Dopplers MoM 1.14 which is normal suggesting no  anemia.  Ms. Cullinan has a known history of elevated BMI, chronic  hypertension not on medication but on low dose ASA. She  continues using synthroid due to hypothyroidism.  Her most recent TSH is normal 02/10 ---------------------------------------------------------------------- Recommendations  Continue q 2 weeks testing for MCA  Repeat growth in 4 weeks. ----------------------------------------------------------------------               Lin Landsman, MD Electronically Signed Final Report   10/10/2020 10:22 am ----------------------------------------------------------------------   Assessment and Plan:  Pregnancy: [id number] at [redacted]w[redacted]d 1. Gestational diabetes mellitus (GDM) in third trimester, gestational diabetes method of control unspecified Only has two days of values, most are within range but two abnormal fasting >95.  Will monitor closely. She reports eating late dinners.  Will try to modify dinner habits. If still elevated by next visit, will start intervention.   2. Essential hypertension affecting pregnancy in third trimester Increased Labetalol to 200 mg bid for better control. Needs antenatal testing starting at 32 weeks, ordered. Referred to Cardio Obstetrics given various risk factors.  - [place] MFM FETAL BPP WO NON STRESS; Future - labetalol (NORMODYNE) 100 MG tablet; Take 2 tablets (200 mg total) by mouth 2 (two) times daily.  Dispense: 90 tablet; Refill: 3 - AMB Referral to Cardio Obstetrics  3. Maternal morbid obesity, antepartum (HCC) TWG 20 lbs, discussed with patient. Will continu to monitor closely.  - [place] MFM FETAL BPP WO NON STRESS; Future - AMB Referral to Cardio Obstetrics  4. 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 Multigravida of advanced maternal age in third trimester Antenatal testing will start for other indications, already getting serial growth scans. - [place] MFM FETAL BPP WO NON STRESS; Future - AMB Referral to Cardio Obstetrics  6. History of 2 cesarean sections Desires TOLAC. Advised that this will only occur after spontaneous labor.  Had C/S> VBAC x 3>C/S. Discussed risks of uterine rupture etc, consent given to her to review at home. Will sign next visit.  7. Carpal tunnel syndrome during pregnancy Advised to wear wrist splints, analgesia as needed  8. [redacted] weeks gestation of pregnancy - Tdap vaccine greater than or equal to 7yo IM given today.  9. Supervision of high risk pregnancy, antepartum Had a lengthy discussion about contraception, high risk nature of current pregnancy and subsequent pregnancies given her comorbidities.  Will discuss with her husband.   Preterm labor symptoms and general obstetric precautions including but not limited to vaginal bleeding, contractions,  leaking of fluid and fetal movement were reviewed in detail with the patient. Please refer to After Visit Summary for other counseling recommendations.   Return in about 2 weeks (around 11/10/2020) for OFFICE OB VISIT (MD only).  Future Appointments  Date Time Provider Department Center  11/07/2020  9:15 AM [facility] NURSE [facility]  11/07/2020  9:30 AM [facility] US3 [facility]  11/10/2020  8:45 AM Warden Fillers, MD [facility] None  11/21/2020  7:45 AM [facility] NURSE [facility]  11/21/2020  8:00 AM [facility] US1 [facility]  12/05/2020  7:45 AM [facility] NURSE [facility]  12/05/2020  8:00 AM [facility] US1 [facility]    Jaynie Collins, MD

## 2020-10-27 NOTE — Patient Instructions (Addendum)
Return to office for any scheduled appointments. Call the office or go to the MAU at [facility] at [facility] if:  You begin to have strong, frequent contractions  Your water breaks.  Sometimes it is a big gush of fluid, sometimes it is just a trickle that keeps getting your panties wet or running down your legs  You have vaginal bleeding.  It is normal to have a small amount of spotting if your cervix was checked.   You do not feel your baby moving like normal.  If you do not, get something to eat and drink and lay down and focus on feeling your baby move.   If your baby is still not moving like normal, you should call the office or go to MAU.  Any other obstetric concerns.   Vaginal Birth After Cesarean Delivery  Vaginal birth after cesarean delivery (VBAC) is giving birth vaginally after previously delivering a baby through a cesarean section (C-section). A VBAC may be a safe option for you, depending on your health and other factors. It is important to discuss VBAC with your health care provider early in your pregnancy so you can understand the risks, benefits, and options. Having these discussions early will give you time to make your birth plan. Who are the best candidates for VBAC? The best candidates for VBAC are women who:  Have had one or two prior cesarean deliveries, and the incision made during the delivery was horizontal (low transverse).  Do not have a vertical (classical) scar on their uterus.  Have not had a tear in the wall of their uterus (uterine rupture).  Plan to have more pregnancies. A VBAC is also more likely to be successful:  In women who have previously given birth vaginally.  When labor starts by itself (spontaneously) before the due date. What are the benefits of VBAC? The benefits of delivering your baby vaginally instead of by a cesarean delivery include:  A shorter hospital stay.  A faster recovery time.  Less pain.  Avoiding  risks associated with major surgery, such as infection and blood clots.  Less blood loss and less need for donated blood (transfusions). What are the risks of VBAC? The main risk of attempting a VBAC is that it may fail, forcing your health care provider to deliver your baby by a C-section. Other risks are rare and include:  Tearing (rupture) of the scar from a past cesarean delivery.  Other risks associated with vaginal deliveries. If a repeat cesarean delivery is needed, the risks include:  Blood loss.  Infection.  Blood clot.  Damage to surrounding organs.  Removal of the uterus (hysterectomy), if it is damaged.  Placenta problems in future pregnancies. What else should I know about my options? Delivering a baby through a VBAC is similar to having a normal spontaneous vaginal delivery. Therefore, it is safe:  To try with twins.  For your health care provider to try to turn the baby from a breech position (external cephalic version) during labor.  With epidural analgesia for pain relief. Consider where you would like to deliver your baby. VBAC should be attempted in facilities where an emergency cesarean delivery can be performed. VBAC is not recommended for home births. Any changes in your health or your baby's health during your pregnancy may make it necessary to change your initial decision about VBAC. Your health care provider may recommend that you do not attempt a VBAC if:  Your baby's suspected weight is  8.8 lb (4 kg) or more.  You have preeclampsia. This is a condition that causes high blood pressure along with other symptoms, such as swelling and headaches.  You will have VBAC less than 19 months after your cesarean delivery.  You are past your due date.  You need to have labor started (induced) because your cervix is not ready for labor (unfavorable). Where to find more information  American Pregnancy Association: americanpregnancy.org  [employer] of  Obstetricians and Gynecologists: acog.org Summary  Vaginal birth after cesarean delivery (VBAC) is giving birth vaginally after previously delivering a baby through a cesarean section (C-section). A VBAC may be a safe option for you, depending on your health and other factors.  Discuss VBAC with your health care provider early in your pregnancy so you can understand the risks, benefits, options, and have plenty of time to make your birth plan.  The main risk of attempting a VBAC is that it may fail, forcing your health care provider to deliver your baby by a C-section. Other risks are rare. This information is not intended to replace advice given to you by your health care provider. Make sure you discuss any questions you have with your health care provider. Document Revised: 10/20/2018 Document Reviewed: 10/01/2016 Elsevier Patient Education  2021 Elsevier Inc.  Carpal Tunnel Syndrome  Carpal tunnel syndrome is a condition that causes pain, numbness, and weakness in your hand and fingers. The carpal tunnel is a narrow area located on the palm side of your wrist. Repeated wrist motion or certain diseases may cause swelling within the tunnel. This swelling pinches the main nerve in the wrist. The main nerve in the wrist is called the median nerve. What are the causes? This condition may be caused by:  Repeated and forceful wrist and hand motions.  Wrist injuries.  Arthritis.  A cyst or tumor in the carpal tunnel.  Fluid buildup during pregnancy.  Use of tools that vibrate. Sometimes the cause of this condition is not known. What increases the risk? The following factors may make you more likely to develop this condition:  Having a job that requires you to repeatedly or forcefully move your wrist or hand or requires you to use tools that vibrate. This may include jobs that involve using computers, working on an [employer], or working with power tools such as Insurance account manager.  Being a woman.  Having certain conditions, such as: ? Diabetes. ? Obesity. ? An underactive thyroid (hypothyroidism). ? Kidney failure. ? Rheumatoid arthritis. What are the signs or symptoms? Symptoms of this condition include:  A tingling feeling in your fingers, especially in your thumb, index, and middle fingers.  Tingling or numbness in your hand.  An aching feeling in your entire arm, especially when your wrist and elbow are bent for a long time.  Wrist pain that goes up your arm to your shoulder.  Pain that goes down into your palm or fingers.  A weak feeling in your hands. You may have trouble grabbing and holding items. Your symptoms may feel worse during the night. How is this diagnosed? This condition is diagnosed with a medical history and physical exam. You may also have tests, including:  Electromyogram (EMG). This test measures electrical signals sent by your nerves into the muscles.  Nerve conduction study. This test measures how well electrical signals pass through your nerves.  Imaging tests, such as X-rays, ultrasound, and MRI. These tests check for possible causes of your condition. How  is this treated? This condition may be treated with:  Lifestyle changes. It is important to stop or change the activity that caused your condition.  Doing exercise and activities to strengthen and stretch your muscles and tendons (physical therapy).  Making lifestyle changes to help with your condition and learning how to do your daily activities safely (occupational therapy).  Medicines for pain and inflammation. This may include medicine that is injected into your wrist.  A wrist splint or brace.  Surgery. Follow these instructions at home: If you have a splint or brace:  Wear the splint or brace as told by your health care provider. Remove it only as told by your health care provider.  Loosen the splint or brace if your fingers tingle, become numb, or  turn cold and blue.  Keep the splint or brace clean.  If the splint or brace is not waterproof: ? Do not let it get wet. ? Cover it with a watertight covering when you take a bath or shower. Managing pain, stiffness, and swelling If directed, put ice on the painful area. To do this:  If you have a removeable splint or brace, remove it as told by your health care provider.  Put ice in a plastic bag.  Place a towel between your skin and the bag or between the splint or brace and the bag.  Leave the ice on for 20 minutes, 2-3 times a day. Do not fall asleep with the cold pack on your skin.  Remove the ice if your skin turns bright red. This is very important. If you cannot feel pain, heat, or cold, you have a greater risk of damage to the area. Move your fingers often to reduce stiffness and swelling.   General instructions  Take over-the-counter and prescription medicines only as told by your health care provider.  Rest your wrist and hand from any activity that may be causing your pain. If your condition is work related, talk with your employer about changes that can be made, such as getting a wrist pad to use while typing.  Do any exercises as told by your health care provider, physical therapist, or occupational therapist.  Keep all follow-up visits. This is important. Contact a health care provider if:  You have new symptoms.  Your pain is not controlled with medicines.  Your symptoms get worse. Get help right away if:  You have severe numbness or tingling in your wrist or hand. Summary  Carpal tunnel syndrome is a condition that causes pain, numbness, and weakness in your hand and fingers.  It is usually caused by repeated wrist motions.  Lifestyle changes and medicines are used to treat carpal tunnel syndrome. Surgery may be recommended.  Follow your health care provider's instructions about wearing a splint, resting from activity, keeping follow-up visits, and  calling for help. This information is not intended to replace advice given to you by your health care provider. Make sure you discuss any questions you have with your health care provider. Document Revised: 11/04/2019 Document Reviewed: 11/04/2019 Elsevier Patient Education  2021 [employer]. o

## 2020-11-07 ENCOUNTER — Ambulatory Visit (HOSPITAL_BASED_OUTPATIENT_CLINIC_OR_DEPARTMENT_OTHER): Payer: Medicaid Other

## 2020-11-07 ENCOUNTER — Encounter: Payer: Self-pay | Admitting: *Deleted

## 2020-11-07 ENCOUNTER — Ambulatory Visit: Payer: Medicaid Other

## 2020-11-07 ENCOUNTER — Inpatient Hospital Stay (HOSPITAL_COMMUNITY)
Admission: AD | Admit: 2020-11-07 | Discharge: 2020-11-08 | Disposition: A | Discharge status: Home / Self Care | Payer: Medicaid Other | Attending: Obstetrics and Gynecology | Admitting: Obstetrics and Gynecology

## 2020-11-07 ENCOUNTER — Other Ambulatory Visit: Payer: Self-pay

## 2020-11-07 ENCOUNTER — Other Ambulatory Visit: Payer: Self-pay | Admitting: *Deleted

## 2020-11-07 ENCOUNTER — Other Ambulatory Visit: Payer: Self-pay | Admitting: Obstetrics and Gynecology

## 2020-11-07 ENCOUNTER — Ambulatory Visit: Payer: Medicaid Other | Attending: Obstetrics and Gynecology | Admitting: *Deleted

## 2020-11-07 ENCOUNTER — Encounter (HOSPITAL_COMMUNITY): Payer: Self-pay | Admitting: Obstetrics and Gynecology

## 2020-11-07 DIAGNOSIS — O09523 Supervision of elderly multigravida, third trimester: Secondary | ICD-10-CM | POA: Diagnosis not present

## 2020-11-07 DIAGNOSIS — O09213 Supervision of pregnancy with history of pre-term labor, third trimester: Secondary | ICD-10-CM

## 2020-11-07 DIAGNOSIS — O24419 Gestational diabetes mellitus in pregnancy, unspecified control: Secondary | ICD-10-CM

## 2020-11-07 DIAGNOSIS — E039 Hypothyroidism, unspecified: Secondary | ICD-10-CM | POA: Diagnosis not present

## 2020-11-07 DIAGNOSIS — O26893 Other specified pregnancy related conditions, third trimester: Secondary | ICD-10-CM | POA: Diagnosis present

## 2020-11-07 DIAGNOSIS — O23593 Infection of other part of genital tract in pregnancy, third trimester: Secondary | ICD-10-CM | POA: Diagnosis not present

## 2020-11-07 DIAGNOSIS — O99213 Obesity complicating pregnancy, third trimester: Secondary | ICD-10-CM

## 2020-11-07 DIAGNOSIS — O365930 Maternal care for other known or suspected poor fetal growth, third trimester, not applicable or unspecified: Secondary | ICD-10-CM | POA: Insufficient documentation

## 2020-11-07 DIAGNOSIS — O99283 Endocrine, nutritional and metabolic diseases complicating pregnancy, third trimester: Secondary | ICD-10-CM | POA: Insufficient documentation

## 2020-11-07 DIAGNOSIS — O361130 Maternal care for Anti-A sensitization, third trimester, not applicable or unspecified: Secondary | ICD-10-CM | POA: Diagnosis not present

## 2020-11-07 DIAGNOSIS — E669 Obesity, unspecified: Secondary | ICD-10-CM

## 2020-11-07 DIAGNOSIS — O09522 Supervision of elderly multigravida, second trimester: Secondary | ICD-10-CM

## 2020-11-07 DIAGNOSIS — Z3A31 31 weeks gestation of pregnancy: Secondary | ICD-10-CM | POA: Diagnosis not present

## 2020-11-07 DIAGNOSIS — O09293 Supervision of pregnancy with other poor reproductive or obstetric history, third trimester: Secondary | ICD-10-CM | POA: Diagnosis not present

## 2020-11-07 DIAGNOSIS — O361930 Maternal care for other isoimmunization, third trimester, not applicable or unspecified: Secondary | ICD-10-CM | POA: Diagnosis not present

## 2020-11-07 DIAGNOSIS — O34219 Maternal care for unspecified type scar from previous cesarean delivery: Secondary | ICD-10-CM

## 2020-11-07 DIAGNOSIS — Z98891 History of uterine scar from previous surgery: Secondary | ICD-10-CM

## 2020-11-07 DIAGNOSIS — N898 Other specified noninflammatory disorders of vagina: Secondary | ICD-10-CM | POA: Insufficient documentation

## 2020-11-07 DIAGNOSIS — O321XX0 Maternal care for breech presentation, not applicable or unspecified: Secondary | ICD-10-CM

## 2020-11-07 DIAGNOSIS — Z7982 Long term (current) use of aspirin: Secondary | ICD-10-CM | POA: Insufficient documentation

## 2020-11-07 DIAGNOSIS — O10013 Pre-existing essential hypertension complicating pregnancy, third trimester: Secondary | ICD-10-CM

## 2020-11-07 LAB — COMPREHENSIVE METABOLIC PANEL
ALT: 37 U/L (ref 0–44)
AST: 36 U/L (ref 15–41)
Albumin: 3 g/dL — ABNORMAL LOW (ref 3.5–5.0)
Alkaline Phosphatase: 79 U/L (ref 38–126)
Anion gap: 5 (ref 5–15)
BUN: 13 mg/dL (ref 6–20)
CO2: 21 mmol/L — ABNORMAL LOW (ref 22–32)
Calcium: 9.2 mg/dL (ref 8.9–10.3)
Chloride: 107 mmol/L (ref 98–111)
Creatinine, Ser: 0.66 mg/dL (ref 0.44–1.00)
GFR, Estimated: >60 mL/min (ref >60)
Glucose, Bld: 82 mg/dL (ref 70–99)
Potassium: 3.8 mmol/L (ref 3.5–5.1)
Sodium: 133 mmol/L — ABNORMAL LOW (ref 135–145)
Total Bilirubin: 0.4 mg/dL (ref 0.3–1.2)
Total Protein: 6.4 g/dL — ABNORMAL LOW (ref 6.5–8.1)

## 2020-11-07 LAB — CBC
HCT: 36.4 % (ref 36.0–46.0)
Hemoglobin: 12.6 g/dL (ref 12.0–15.0)
MCH: 30 pg (ref 26.0–34.0)
MCHC: 34.6 g/dL (ref 30.0–36.0)
MCV: 86.7 fL (ref 80.0–100.0)
Platelets: 214 10*3/uL (ref 150–400)
RBC: 4.2 MIL/uL (ref 3.87–5.11)
RDW: 13.3 % (ref 11.5–15.5)
WBC: 9.4 10*3/uL (ref 4.0–10.5)
nRBC: 0 % (ref 0.0–0.2)

## 2020-11-07 LAB — AMNISURE RUPTURE OF MEMBRANE (ROM) NOT AT ARMC: Amnisure ROM: NEGATIVE

## 2020-11-07 LAB — PROTEIN / CREATININE RATIO, URINE
Creatinine, Urine: 101.15 mg/dL
Protein Creatinine Ratio: 0.41 mg/mg{Cre} — ABNORMAL HIGH (ref 0.00–0.15)
Total Protein, Urine: 41 mg/dL

## 2020-11-07 MED ORDER — LABETALOL HCL 5 MG/ML IV SOLN
20.0000 mg | INTRAVENOUS | Status: DC | PRN
  Administered 2020-11-07: 20 mg via INTRAVENOUS
  Filled 2020-11-07: qty 4

## 2020-11-07 MED ORDER — LABETALOL HCL 5 MG/ML IV SOLN: 40.0000 mg | INTRAVENOUS | Status: DC | PRN

## 2020-11-07 MED ORDER — LABETALOL HCL 5 MG/ML IV SOLN: 80.0000 mg | INTRAVENOUS | Status: DC | PRN

## 2020-11-07 MED ORDER — HYDRALAZINE HCL 20 MG/ML IJ SOLN: 10.0000 mg | INTRAMUSCULAR | Status: DC | PRN

## 2020-11-07 MED ORDER — LABETALOL HCL 100 MG PO TABS
200.0000 mg | ORAL_TABLET | Freq: Once | ORAL | Status: AC
  Administered 2020-11-07: 200 mg via ORAL
  Filled 2020-11-07: qty 2

## 2020-11-07 NOTE — MAU Note (Signed)
Pt reports she was told to come in because her fluid is "dangerously low". States she thinks she has been leaking for ? Two weeks, has increased for the last 3-4 days. Has not been seen for this until today and mentioned in in MFM.

## 2020-11-07 NOTE — MAU Provider Note (Addendum)
Chief Complaint:  Rupture of Membranes   Event Date/Time   First Provider Initiated Contact with Patient 11/07/20 2153     HPI: Marisa Campbell is a 40 y.o. [id number] at 74w5dho presents to maternity admissions reporting being told her fluid was low and possibly leaking for 2 weeks.  States has had contractions off and on.  States she "told them 2 weeks ago and they said it was probably urine and didn't check me". . She reports good fetal movement, denies vaginal bleeding, vaginal itching/burning, urinary symptoms, h/a, dizziness, n/v, diarrhea, constipation or fever/chills.  She denies headache, visual changes or RUQ abdominal pain.  Vaginal Discharge The patient's primary symptoms include vaginal discharge. The patient's pertinent negatives include no genital itching, genital lesions, genital odor, pelvic pain or vaginal bleeding. This is a recurrent problem. The current episode started 1 to 4 weeks ago. The problem occurs intermittently. The problem has been unchanged. The patient is experiencing no pain. She is pregnant. Pertinent negatives include no abdominal pain, back pain, chills, constipation, diarrhea, dysuria, fever, frequency, headaches, nausea or vomiting. The vaginal discharge was clear and watery. There has been no bleeding. She has not been passing clots. She has not been passing tissue. Nothing aggravates the symptoms. She has tried nothing for the symptoms.  Hypertension This is a chronic problem. Associated symptoms include peripheral edema. Pertinent negatives include no anxiety, blurred vision, chest pain, headaches or malaise/fatigue. There are no associated agents to hypertension. Risk factors for coronary artery disease include diabetes mellitus and obesity. Treatments tried: Labetalol      RN Note: Pt reports she was told to come in because her fluid is "dangerously low". States she thinks she has been leaking for ? Two weeks, has increased for the last 3-4 days. Has not  been seen for this until today and mentioned in in MFM.   Past Medical History: Past Medical History:  Diagnosis Date  . Anemia   . COVID-19 01/31/2019  . Headache   . 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 Hypothyroidism    on meds currently  . Vaginal Pap smear, abnormal     Past obstetric history: OB History  Gravida Para Term Preterm AB Living  _0 SAB IAB Ectopic Multiple Live Births  1 1   0 5    # Outcome Date GA Lbr Len/2nd Weight Sex Delivery Anes PTL Lv  8 Current           7 Preterm 06/01/19 [id number] g F CS-LTranv Spinal  LIV  6 Term 11/08/17 3914w3d VBAC   LIV  5 SAB 2017          4 IAB 2014          3 Term 2008 [id number] g M Vag-Spont   LIV  2 Term 2003 40w045w0d8 g M Vag-Spont   LIV  1 Term 2000 89w0d25w0d g M CS-LTranv   LIV     Complications: Failure to progress in labor    Past Surgical History: Past Surgical History:  Procedure Laterality Date  . CESAREAN SECTION    . CESAREAN SECTION N/A 06/01/2019   Procedure: CESAREAN SECTION;  Surgeon: AndersOlga Millers Location: [facility] LD ORS;  Service: Obstetrics;  Laterality: N/A;    Family History: Family History  Problem Relation Age of Onset  . Hypertension Father  Social History: Social History   Tobacco Use  . Smoking status: Never Smoker  . Smokeless tobacco: Never Used  Vaping Use  . Vaping Use: Never used  Substance Use Topics  . Alcohol use: No  . Drug use: No    Allergies: No Known Allergies  Meds:  Medications Prior to Admission  Medication Sig Dispense Refill Last Dose  . aspirin EC 81 MG tablet Take 81 mg by mouth daily. Swallow whole.   11/07/2020 at Unknown time  . famotidine (PEPCID) 20 MG tablet Take 1 tablet (20 mg total) by mouth daily. 30 tablet 3 11/07/2020 at Unknown time  . labetalol (NORMODYNE) 100 MG tablet Take 2 tablets (200 mg total) by mouth 2 (two) times daily. 90 tablet 3 11/07/2020 at 0800  . levothyroxine (SYNTHROID) 137  MCG tablet Take 1 tablet (137 mcg total) by mouth daily before breakfast. 30 tablet 2 11/07/2020 at Unknown time  . Prenatal Vit-Fe Fumarate-FA (PRENATAL VITAMIN PO) Take by mouth.   11/06/2020 at Unknown time  . Accu-Chek Softclix Lancets lancets Use 1 test strip each to check blood sugar 4 times daily. 100 each 5   . Blood Glucose Monitoring Suppl (ACCU-CHEK GUIDE) w/Device KIT Use glucose meter to check blood sugars as directed 1 kit 0   . Blood Pressure Monitoring (BLOOD PRESSURE KIT) DEVI 1 kit by Does not apply route once a week. Check Blood Pressure regularly and record readings into the [employer] App.  Large Cuff.  DX O90.0 (Patient not taking: No sig reported) 1 each 0   . glucose blood (ACCU-CHEK GUIDE) test strip Use 1 test strip to check blood sugar 4 times daily 100 each 5     I have reviewed patient's Past Medical Hx, Surgical Hx, Family Hx, Social Hx, medications and allergies.   ROS:  Review of Systems  Constitutional: Negative for chills, fever and malaise/fatigue.  Eyes: Negative for blurred vision.  Cardiovascular: Negative for chest pain.  Gastrointestinal: Negative for abdominal pain, constipation, diarrhea, nausea and vomiting.  Genitourinary: Positive for vaginal discharge. Negative for dysuria, frequency and pelvic pain.  Musculoskeletal: Negative for back pain.  Neurological: Negative for headaches.   Other systems negative  Physical Exam   Patient Vitals for the past 24 hrs:  BP Temp Temp src Pulse Resp SpO2 Height Weight  11/07/20 2218 (!) 162/86 -- -- (!) 56 -- -- -- --  11/07/20 2200 -- -- -- -- -- 99 % -- --  11/07/20 2155 -- -- -- -- -- 98 % -- --  11/07/20 2150 -- -- -- -- -- 99 % -- --  11/07/20 2149 (!) 171/83 -- -- (!) 51 -- -- -- --  11/07/20 2127 (!) 173/83 98.3 F (36.8 C) Oral (!) 58 19 99 % 5' 2.5" (1.588 m) (!) 141.5 kg   Vitals:   11/08/20 0002 11/08/20 0017 11/08/20 0032 11/08/20 0047  BP: (!) 154/68 (!) 149/75 (!) 145/69 138/72  Pulse:  (!) 50 (!) 51 (!) 47 (!) 49  Resp:      Temp:      TempSrc:      SpO2:      Weight:      Height:        Constitutional: Well-developed, well-nourished female in no acute distress.  Cardiovascular: normal rate and rhythm Respiratory: normal effort, clear to auscultation bilaterally GI: Abd soft, non-tender, gravid appropriate for gestational age.   No rebound or guarding. MS: Extremities nontender, 1+ pedal edema, normal ROM Neurologic: Alert  and oriented x 4. DTRs 2+ with no clonus GU: Neg CVAT.  PELVIC EXAM: Pelvic exam deferred due to possible PPROM  Amnisure sent instead.  No visible fluid leaking from vagina   FHT:  Baseline 140 , moderate variability, accelerations present, no decelerations Contractions: Intermittent uterine irritability   Labs: AB/Positive/-- (11/18 0000) Results for orders placed or performed during the hospital encounter of 11/07/20 (from the past 24 hour(s))  Amnisure rupture of membrane (rom)not at [facility]     Status: None   Collection Time: 11/07/20 10:04 PM  Result Value Ref Range   Amnisure ROM NEGATIVE   CBC     Status: None   Collection Time: 11/07/20 10:04 PM  Result Value Ref Range   WBC 9.4 4.0 - 10.5 K/uL   RBC 4.20 3.87 - 5.11 MIL/uL   Hemoglobin 12.6 12.0 - 15.0 g/dL   HCT 36.4 36.0 - 46.0 %   MCV 86.7 80.0 - 100.0 fL   MCH 30.0 26.0 - 34.0 pg   MCHC 34.6 30.0 - 36.0 g/dL   RDW 13.3 11.5 - 15.5 %   Platelets 214 150 - 400 K/uL   nRBC 0.0 0.0 - 0.2 %  Comprehensive metabolic panel     Status: Abnormal   Collection Time: 11/07/20 10:04 PM  Result Value Ref Range   Sodium 133 (L) 135 - 145 mmol/L   Potassium 3.8 3.5 - 5.1 mmol/L   Chloride 107 98 - 111 mmol/L   CO2 21 (L) 22 - 32 mmol/L   Glucose, Bld 82 70 - 99 mg/dL   BUN 13 6 - 20 mg/dL   Creatinine, Ser 0.66 0.44 - 1.00 mg/dL   Calcium 9.2 8.9 - 10.3 mg/dL   Total Protein 6.4 (L) 6.5 - 8.1 g/dL   Albumin 3.0 (L) 3.5 - 5.0 g/dL   AST 36 15 - 41 U/L   ALT 37 0 - 44 U/L    Alkaline Phosphatase 79 38 - 126 U/L   Total Bilirubin 0.4 0.3 - 1.2 mg/dL   GFR, Estimated >60 >60 mL/min   Anion gap 5 5 - 15  Protein / creatinine ratio, urine     Status: Abnormal   Collection Time: 11/07/20 10:04 PM  Result Value Ref Range   Creatinine, Urine 101.15 mg/dL   Total Protein, Urine 41 mg/dL   Protein Creatinine Ratio 0.41 (H) 0.00 - 0.15 mg/mg[Cre]  Type and screen     Status: None (Preliminary result)   Collection Time: 11/07/20 10:04 PM  Result Value Ref Range   ABO/RH(D) AB POS    Antibody Screen POS    Sample Expiration      11/10/2020,2359 Performed at [facility], [address], [place], [place] [address]    Unit Number [id number]    Blood Component Type RBC, LR IRR    Unit division 00    Status of Unit ALLOCATED    Donor AG Type NEGATIVE FOR E ANTIGEN NEGATIVE FOR c ANTIGEN    Transfusion Status OK TO TRANSFUSE    Crossmatch Result COMPATIBLE    Unit Number [id number]    Blood Component Type RBC, LR IRR    Unit division 00    Status of Unit ALLOCATED    Donor AG Type NEGATIVE FOR E ANTIGEN NEGATIVE FOR c ANTIGEN    Transfusion Status OK TO TRANSFUSE    Crossmatch Result COMPATIBLE     Imaging:    MAU Course/MDM: I have ordered labs and reviewed results.  These are normal except for elevated protein/creat ratio.   NST reviewed, reassuring Initial BPs in the severe range.  Protocol begun and 1 dose of IV Labetalol was given with resolution to 465-681E systolic. Consult Dr Elgie Congo with presentation, exam findings and test results. He recommends discharge home if BPs settle in the non-severe range.   They did settle into the 130-140s/60s and 70s. Discussed Labetalol dosing with patient. She was told to take 227m bid but has been taking 1076mbid.  States new Rx never sent to pharmacy and she just continued her former dose.   Assessment: Single IUP at 3131w6dginal discharge, Amnisure negative Reactive NST Low normal AFI on  [place] Isoimmunization  Plan: Discharge home New Rx sent for Labetalol [id number] Preterm Labor precautions and fetal kick counts Follow up in Office for prenatal visits   Encouraged to return if she develops worsening of symptoms, increase in pain, fever, or other concerning symptoms.   Pt stable at time of discharge.  MariHansel Feinstein, MSN Certified Nurse-Midwife 11/07/2020 10:39 PM

## 2020-11-07 NOTE — Progress Notes (Signed)
C/o" increased swelling in hands and lower extremities."

## 2020-11-08 DIAGNOSIS — Z3A31 31 weeks gestation of pregnancy: Secondary | ICD-10-CM

## 2020-11-08 DIAGNOSIS — N898 Other specified noninflammatory disorders of vagina: Secondary | ICD-10-CM

## 2020-11-08 DIAGNOSIS — O23593 Infection of other part of genital tract in pregnancy, third trimester: Secondary | ICD-10-CM

## 2020-11-08 DIAGNOSIS — O365930 Maternal care for other known or suspected poor fetal growth, third trimester, not applicable or unspecified: Secondary | ICD-10-CM

## 2020-11-08 MED ORDER — LABETALOL HCL 200 MG PO TABS: 200.0000 mg | ORAL_TABLET | Freq: Two times a day (BID) | ORAL | 3 refills | Status: DC

## 2020-11-08 NOTE — Discharge Instructions (Signed)
Hypertension During Pregnancy Hypertension is also called high blood pressure. High blood pressure means that the force of the blood moving in your body is high enough to cause problems for you and your baby. Different types of high blood pressure can happen during pregnancy. The types are:  High blood pressure before you got pregnant. This is called chronic hypertension.  This can continue during your pregnancy. Your doctor will want to keep checking your blood pressure. You may need medicine to control your blood pressure while you are pregnant. You will need follow-up visits after you have your baby.  High blood pressure that goes up during pregnancy when it was normal before. This is called gestational hypertension. It will often get better after you have your baby, but your doctor will need to watch your blood pressure to make sure that it is getting better.  You may develop high blood pressure after giving birth. This is called postpartum hypertension. This often occurs within 48 hours after childbirth but may occur up to 6 weeks after giving birth. Very high blood pressure during pregnancy is an emergency that needs treatment right away. How does this affect me? If you have high blood pressure during pregnancy, you have a higher chance of developing high blood pressure:  As you get older.  If you get pregnant again. In some cases, high blood pressure during pregnancy can cause:  Stroke.  Heart attack.  Damage to the kidneys, lungs, or liver.  Preeclampsia.  HELLP syndrome.  Seizures.  Problems with the placenta. How does this affect my baby? Your baby may:  Be born early.  Not weigh as much as he or she should.  Not handle labor well, leading to a C-section. This condition may also result in a baby's death before birth (stillbirth). What are the risks?  Having high blood pressure during a past pregnancy.  Being overweight.  Being age 35 or older.  Being pregnant  for the first time.  Being pregnant with more than one baby.  Becoming pregnant using fertility methods, such as IVF.  Having other problems, such as diabetes or kidney disease. What can I do to lower my risk?  Keep a healthy weight.  Eat a healthy diet.  Follow what your doctor tells you about treating any medical problems that you had before you got pregnant. It is very important to go to all of your doctor visits. Your doctor will check your blood pressure and make sure that your pregnancy is progressing as it should. Treatment should start early if a problem is found.   How is this treated? Treatment for high blood pressure during pregnancy can vary. It depends on the type of high blood pressure you have and how serious it is.  If you were taking medicine for your blood pressure before you got pregnant, talk with your doctor. You may need to change the medicine during pregnancy if it is not safe for your baby.  If your blood pressure goes up during pregnancy, your doctor may order medicine to treat this.  If you are at risk for preeclampsia, your doctor may tell you to take a low-dose aspirin while you are pregnant.  If you have very high blood pressure, you may need to stay in the hospital so you and your baby can be watched closely. You may also need to take medicine to lower your blood pressure.  In some cases, if your condition gets worse, you may need to have your baby early.   Follow these instructions at home: Eating and drinking  Drink enough fluid to keep your pee (urine) pale yellow.  Avoid caffeine.   Lifestyle  Do not smoke or use any products that contain nicotine or tobacco. If you need help quitting, ask your doctor.  Do not use alcohol or drugs.  Avoid stress.  Rest and get plenty of sleep.  Regular exercise can help. Ask your doctor what kinds of exercise are best for you. General instructions  Take over-the-counter and prescription medicines only as  told by your doctor.  Keep all prenatal and follow-up visits. Contact a doctor if:  You have symptoms that your doctor told you to watch for, such as: ? Headaches. ? A feeling like you may vomit (nausea). ? Vomiting. ? Belly (abdominal) pain. ? Feeling dizzy or light-headed. Get help right away if:  You have symptoms of serious problems, such as: ? Very bad belly pain that does not get better with treatment. ? A very bad headache that does not get better. ? Blurry vision. ? Double vision. ? Vomiting that does not get better. ? Sudden, fast weight gain. ? Sudden swelling in your hands, ankles, or face. ? Bleeding from your vagina. ? Blood in your pee. ? Shortness of breath. ? Chest pain. ? Weakness on one side of your body. ? Trouble talking.  Your baby is not moving as much as usual. These symptoms may be an emergency. Get help right away. Call your local emergency services (911 in the [place]  Do not wait to see if the symptoms will go away.  Do not drive yourself to the hospital. Summary  High blood pressure is also called hypertension.  High blood pressure means that the force of the blood moving in your body is high enough to cause problems for you and your baby.  Get help right away if you have symptoms of serious problems due to high blood pressure.  Keep all prenatal and follow-up visits. This information is not intended to replace advice given to you by your health care provider. Make sure you discuss any questions you have with your health care provider. Document Revised: 03/16/2020 Document Reviewed: 03/16/2020 Elsevier Patient Education  2021 Elsevier Inc.  

## 2020-11-10 ENCOUNTER — Other Ambulatory Visit: Payer: Self-pay

## 2020-11-10 ENCOUNTER — Ambulatory Visit (INDEPENDENT_AMBULATORY_CARE_PROVIDER_SITE_OTHER): Payer: Medicaid Other | Admitting: Obstetrics and Gynecology

## 2020-11-10 ENCOUNTER — Inpatient Hospital Stay (HOSPITAL_COMMUNITY)
Admission: AD | Admit: 2020-11-10 | Discharge: 2020-11-27 | DRG: 806 | Disposition: A | Discharge status: Home / Self Care | Payer: Medicaid Other | Attending: Obstetrics and Gynecology | Admitting: Obstetrics and Gynecology

## 2020-11-10 ENCOUNTER — Encounter (HOSPITAL_COMMUNITY): Payer: Self-pay | Admitting: Obstetrics & Gynecology

## 2020-11-10 VITALS — BP 173/97 | HR 57 | Wt 315.7 lb

## 2020-11-10 DIAGNOSIS — O34211 Maternal care for low transverse scar from previous cesarean delivery: Secondary | ICD-10-CM | POA: Diagnosis present

## 2020-11-10 DIAGNOSIS — O361130 Maternal care for Anti-A sensitization, third trimester, not applicable or unspecified: Secondary | ICD-10-CM

## 2020-11-10 DIAGNOSIS — O24419 Gestational diabetes mellitus in pregnancy, unspecified control: Secondary | ICD-10-CM | POA: Diagnosis not present

## 2020-11-10 DIAGNOSIS — O09523 Supervision of elderly multigravida, third trimester: Secondary | ICD-10-CM | POA: Diagnosis not present

## 2020-11-10 DIAGNOSIS — R001 Bradycardia, unspecified: Secondary | ICD-10-CM | POA: Diagnosis not present

## 2020-11-10 DIAGNOSIS — O09529 Supervision of elderly multigravida, unspecified trimester: Secondary | ICD-10-CM

## 2020-11-10 DIAGNOSIS — O365990 Maternal care for other known or suspected poor fetal growth, unspecified trimester, not applicable or unspecified: Secondary | ICD-10-CM

## 2020-11-10 DIAGNOSIS — E039 Hypothyroidism, unspecified: Secondary | ICD-10-CM

## 2020-11-10 DIAGNOSIS — O34219 Maternal care for unspecified type scar from previous cesarean delivery: Secondary | ICD-10-CM

## 2020-11-10 DIAGNOSIS — O99283 Endocrine, nutritional and metabolic diseases complicating pregnancy, third trimester: Secondary | ICD-10-CM | POA: Diagnosis not present

## 2020-11-10 DIAGNOSIS — O1413 Severe pre-eclampsia, third trimester: Secondary | ICD-10-CM | POA: Diagnosis present

## 2020-11-10 DIAGNOSIS — O99214 Obesity complicating childbirth: Secondary | ICD-10-CM | POA: Diagnosis present

## 2020-11-10 DIAGNOSIS — O0993 Supervision of high risk pregnancy, unspecified, third trimester: Secondary | ICD-10-CM

## 2020-11-10 DIAGNOSIS — Z8619 Personal history of other infectious and parasitic diseases: Secondary | ICD-10-CM | POA: Diagnosis present

## 2020-11-10 DIAGNOSIS — N870 Mild cervical dysplasia: Secondary | ICD-10-CM

## 2020-11-10 DIAGNOSIS — Z3689 Encounter for other specified antenatal screening: Secondary | ICD-10-CM

## 2020-11-10 DIAGNOSIS — O99892 Other specified diseases and conditions complicating childbirth: Secondary | ICD-10-CM | POA: Diagnosis not present

## 2020-11-10 DIAGNOSIS — O114 Pre-existing hypertension with pre-eclampsia, complicating childbirth: Secondary | ICD-10-CM | POA: Diagnosis present

## 2020-11-10 DIAGNOSIS — Z8616 Personal history of COVID-19: Secondary | ICD-10-CM

## 2020-11-10 DIAGNOSIS — O99284 Endocrine, nutritional and metabolic diseases complicating childbirth: Secondary | ICD-10-CM | POA: Diagnosis present

## 2020-11-10 DIAGNOSIS — O9081 Anemia of the puerperium: Secondary | ICD-10-CM | POA: Diagnosis not present

## 2020-11-10 DIAGNOSIS — Z3A33 33 weeks gestation of pregnancy: Secondary | ICD-10-CM | POA: Diagnosis not present

## 2020-11-10 DIAGNOSIS — R768 Other specified abnormal immunological findings in serum: Secondary | ICD-10-CM

## 2020-11-10 DIAGNOSIS — O99820 Streptococcus B carrier state complicating pregnancy: Secondary | ICD-10-CM

## 2020-11-10 DIAGNOSIS — O365930 Maternal care for other known or suspected poor fetal growth, third trimester, not applicable or unspecified: Secondary | ICD-10-CM | POA: Diagnosis present

## 2020-11-10 DIAGNOSIS — O10013 Pre-existing essential hypertension complicating pregnancy, third trimester: Secondary | ICD-10-CM | POA: Diagnosis not present

## 2020-11-10 DIAGNOSIS — Z3A32 32 weeks gestation of pregnancy: Secondary | ICD-10-CM | POA: Insufficient documentation

## 2020-11-10 DIAGNOSIS — Z3A34 34 weeks gestation of pregnancy: Secondary | ICD-10-CM | POA: Diagnosis not present

## 2020-11-10 DIAGNOSIS — O99210 Obesity complicating pregnancy, unspecified trimester: Secondary | ICD-10-CM | POA: Diagnosis present

## 2020-11-10 DIAGNOSIS — O24410 Gestational diabetes mellitus in pregnancy, diet controlled: Secondary | ICD-10-CM | POA: Diagnosis not present

## 2020-11-10 DIAGNOSIS — O4100X0 Oligohydramnios, unspecified trimester, not applicable or unspecified: Secondary | ICD-10-CM

## 2020-11-10 DIAGNOSIS — Z98891 History of uterine scar from previous surgery: Secondary | ICD-10-CM

## 2020-11-10 DIAGNOSIS — O1002 Pre-existing essential hypertension complicating childbirth: Secondary | ICD-10-CM | POA: Diagnosis present

## 2020-11-10 DIAGNOSIS — O99824 Streptococcus B carrier state complicating childbirth: Secondary | ICD-10-CM | POA: Diagnosis present

## 2020-11-10 DIAGNOSIS — O9852 Other viral diseases complicating childbirth: Secondary | ICD-10-CM | POA: Diagnosis present

## 2020-11-10 DIAGNOSIS — O1092 Unspecified pre-existing hypertension complicating childbirth: Secondary | ICD-10-CM | POA: Diagnosis not present

## 2020-11-10 DIAGNOSIS — O360930 Maternal care for other rhesus isoimmunization, third trimester, not applicable or unspecified: Secondary | ICD-10-CM | POA: Diagnosis present

## 2020-11-10 DIAGNOSIS — Z20822 Contact with and (suspected) exposure to covid-19: Secondary | ICD-10-CM | POA: Diagnosis present

## 2020-11-10 DIAGNOSIS — O361930 Maternal care for other isoimmunization, third trimester, not applicable or unspecified: Secondary | ICD-10-CM | POA: Diagnosis not present

## 2020-11-10 DIAGNOSIS — O360990 Maternal care for other rhesus isoimmunization, unspecified trimester, not applicable or unspecified: Secondary | ICD-10-CM

## 2020-11-10 DIAGNOSIS — O0990 Supervision of high risk pregnancy, unspecified, unspecified trimester: Secondary | ICD-10-CM

## 2020-11-10 DIAGNOSIS — O113 Pre-existing hypertension with pre-eclampsia, third trimester: Secondary | ICD-10-CM | POA: Diagnosis not present

## 2020-11-10 DIAGNOSIS — O119 Pre-existing hypertension with pre-eclampsia, unspecified trimester: Secondary | ICD-10-CM | POA: Diagnosis present

## 2020-11-10 DIAGNOSIS — B001 Herpesviral vesicular dermatitis: Secondary | ICD-10-CM | POA: Diagnosis present

## 2020-11-10 DIAGNOSIS — O24420 Gestational diabetes mellitus in childbirth, diet controlled: Secondary | ICD-10-CM | POA: Diagnosis present

## 2020-11-10 DIAGNOSIS — O4103X0 Oligohydramnios, third trimester, not applicable or unspecified: Secondary | ICD-10-CM | POA: Diagnosis present

## 2020-11-10 DIAGNOSIS — O10913 Unspecified pre-existing hypertension complicating pregnancy, third trimester: Secondary | ICD-10-CM | POA: Diagnosis not present

## 2020-11-10 DIAGNOSIS — O0940 Supervision of pregnancy with grand multiparity, unspecified trimester: Secondary | ICD-10-CM

## 2020-11-10 DIAGNOSIS — O0943 Supervision of pregnancy with grand multiparity, third trimester: Secondary | ICD-10-CM | POA: Diagnosis not present

## 2020-11-10 DIAGNOSIS — O99213 Obesity complicating pregnancy, third trimester: Secondary | ICD-10-CM | POA: Diagnosis not present

## 2020-11-10 DIAGNOSIS — O361910 Maternal care for other isoimmunization, first trimester, not applicable or unspecified: Secondary | ICD-10-CM | POA: Diagnosis not present

## 2020-11-10 HISTORY — DX: Gestational diabetes mellitus in pregnancy, unspecified control: O24.419

## 2020-11-10 HISTORY — DX: Gestational (pregnancy-induced) hypertension without significant proteinuria, unspecified trimester: O13.9

## 2020-11-10 LAB — RESP PANEL BY RT-PCR (FLU A&B, COVID) ARPGX2
Influenza A by PCR: NEGATIVE
Influenza B by PCR: NEGATIVE
SARS Coronavirus 2 by RT PCR: NEGATIVE

## 2020-11-10 LAB — TYPE AND SCREEN
ABO/RH(D): AB POS
Antibody Screen: POSITIVE
Donor AG Type: NEGATIVE
Donor AG Type: NEGATIVE
Unit division: 0
Unit division: 0

## 2020-11-10 LAB — URINALYSIS, ROUTINE W REFLEX MICROSCOPIC
Bilirubin Urine: NEGATIVE
Glucose, UA: NEGATIVE mg/dL
Hgb urine dipstick: NEGATIVE
Ketones, ur: 5 mg/dL — AB
Leukocytes,Ua: NEGATIVE
Nitrite: NEGATIVE
Protein, ur: 100 mg/dL — AB
Specific Gravity, Urine: 1.018 (ref 1.005–1.030)
pH: 6 (ref 5.0–8.0)

## 2020-11-10 LAB — COMPREHENSIVE METABOLIC PANEL
ALT: 34 U/L (ref 0–44)
AST: 32 U/L (ref 15–41)
Albumin: 2.8 g/dL — ABNORMAL LOW (ref 3.5–5.0)
Alkaline Phosphatase: 72 U/L (ref 38–126)
Anion gap: 4 — ABNORMAL LOW (ref 5–15)
BUN: 11 mg/dL (ref 6–20)
CO2: 24 mmol/L (ref 22–32)
Calcium: 8.7 mg/dL — ABNORMAL LOW (ref 8.9–10.3)
Chloride: 106 mmol/L (ref 98–111)
Creatinine, Ser: 0.67 mg/dL (ref 0.44–1.00)
GFR, Estimated: >60 mL/min (ref >60)
Glucose, Bld: 107 mg/dL — ABNORMAL HIGH (ref 70–99)
Potassium: 4 mmol/L (ref 3.5–5.1)
Sodium: 134 mmol/L — ABNORMAL LOW (ref 135–145)
Total Bilirubin: 0.4 mg/dL (ref 0.3–1.2)
Total Protein: 5.8 g/dL — ABNORMAL LOW (ref 6.5–8.1)

## 2020-11-10 LAB — BPAM RBC
Blood Product Expiration Date: 202205282359
Blood Product Expiration Date: 202205282359
Unit Type and Rh: 5100
Unit Type and Rh: 5100

## 2020-11-10 LAB — CBC
HCT: 33.5 % — ABNORMAL LOW (ref 36.0–46.0)
Hemoglobin: 11.6 g/dL — ABNORMAL LOW (ref 12.0–15.0)
MCH: 30.3 pg (ref 26.0–34.0)
MCHC: 34.6 g/dL (ref 30.0–36.0)
MCV: 87.5 fL (ref 80.0–100.0)
Platelets: 188 10*3/uL (ref 150–400)
RBC: 3.83 MIL/uL — ABNORMAL LOW (ref 3.87–5.11)
RDW: 13.4 % (ref 11.5–15.5)
WBC: 8 10*3/uL (ref 4.0–10.5)
nRBC: 0 % (ref 0.0–0.2)

## 2020-11-10 LAB — PROTEIN / CREATININE RATIO, URINE
Creatinine, Urine: 178.66 mg/dL
Protein Creatinine Ratio: 0.42 mg/mg{Cre} — ABNORMAL HIGH (ref 0.00–0.15)
Total Protein, Urine: 75 mg/dL

## 2020-11-10 LAB — GLUCOSE, CAPILLARY
Glucose-Capillary: 125 mg/dL — ABNORMAL HIGH (ref 70–99)
Glucose-Capillary: 158 mg/dL — ABNORMAL HIGH (ref 70–99)

## 2020-11-10 MED ORDER — VALACYCLOVIR HCL 500 MG PO TABS
500.0000 mg | ORAL_TABLET | Freq: Two times a day (BID) | ORAL | Status: DC
  Filled 2020-11-10 (×3): qty 1

## 2020-11-10 MED ORDER — LABETALOL HCL 5 MG/ML IV SOLN
40.0000 mg | INTRAVENOUS | Status: DC | PRN
  Administered 2020-11-10: 40 mg via INTRAVENOUS
  Filled 2020-11-10: qty 8

## 2020-11-10 MED ORDER — LEVOTHYROXINE SODIUM 137 MCG PO TABS
137.0000 ug | ORAL_TABLET | Freq: Every day | ORAL | Status: DC
  Administered 2020-11-11 – 2020-11-27 (×17): 137 ug via ORAL
  Filled 2020-11-10 (×19): qty 1

## 2020-11-10 MED ORDER — MAGNESIUM SULFATE BOLUS VIA INFUSION
4.0000 g | Freq: Once | INTRAVENOUS | Status: AC
  Administered 2020-11-10: 4 g via INTRAVENOUS
  Filled 2020-11-10: qty 1000

## 2020-11-10 MED ORDER — BETAMETHASONE SOD PHOS & ACET 6 (3-3) MG/ML IJ SUSP
12.0000 mg | INTRAMUSCULAR | Status: AC
Start: 2020-11-10 — End: 2020-11-11
  Administered 2020-11-10 – 2020-11-11 (×2): 12 mg via INTRAMUSCULAR
  Filled 2020-11-10: qty 5

## 2020-11-10 MED ORDER — LABETALOL HCL 5 MG/ML IV SOLN: 80.0000 mg | INTRAVENOUS | Status: DC | PRN

## 2020-11-10 MED ORDER — ASPIRIN EC 81 MG PO TBEC
81.0000 mg | DELAYED_RELEASE_TABLET | Freq: Every day | ORAL | Status: DC
  Administered 2020-11-11 – 2020-11-23 (×13): 81 mg via ORAL
  Filled 2020-11-10 (×14): qty 1

## 2020-11-10 MED ORDER — ACETAMINOPHEN 325 MG PO TABS
650.0000 mg | ORAL_TABLET | ORAL | Status: DC | PRN
  Administered 2020-11-11 – 2020-11-21 (×5): 650 mg via ORAL
  Filled 2020-11-10 (×6): qty 2

## 2020-11-10 MED ORDER — LACTATED RINGERS IV SOLN: INTRAVENOUS | Status: AC

## 2020-11-10 MED ORDER — DOCUSATE SODIUM 100 MG PO CAPS: 100.0000 mg | ORAL_CAPSULE | Freq: Every day | ORAL | Status: DC

## 2020-11-10 MED ORDER — HYDRALAZINE HCL 20 MG/ML IJ SOLN: 10.0000 mg | INTRAMUSCULAR | Status: DC | PRN

## 2020-11-10 MED ORDER — PRENATAL MULTIVITAMIN CH
1.0000 | ORAL_TABLET | Freq: Every day | ORAL | Status: DC
  Administered 2020-11-11 – 2020-11-23 (×14): 1 via ORAL
  Filled 2020-11-10 (×14): qty 1

## 2020-11-10 MED ORDER — LABETALOL HCL 5 MG/ML IV SOLN
20.0000 mg | INTRAVENOUS | Status: DC | PRN
  Administered 2020-11-10 – 2020-11-24 (×2): 20 mg via INTRAVENOUS
  Filled 2020-11-10 (×2): qty 4

## 2020-11-10 MED ORDER — DOCUSATE SODIUM 100 MG PO CAPS
100.0000 mg | ORAL_CAPSULE | Freq: Two times a day (BID) | ORAL | Status: DC | PRN
Start: 2020-11-10 — End: 2020-11-24
  Administered 2020-11-11 – 2020-11-23 (×4): 100 mg via ORAL
  Filled 2020-11-10 (×4): qty 1

## 2020-11-10 MED ORDER — MAGNESIUM SULFATE 40 GM/1000ML IV SOLN
2.0000 g/h | INTRAVENOUS | Status: AC
  Filled 2020-11-10 (×2): qty 1000

## 2020-11-10 MED ORDER — FAMOTIDINE 20 MG PO TABS
20.0000 mg | ORAL_TABLET | Freq: Every day | ORAL | Status: DC
  Administered 2020-11-11 – 2020-11-23 (×13): 20 mg via ORAL
  Filled 2020-11-10 (×14): qty 1

## 2020-11-10 MED ORDER — ZOLPIDEM TARTRATE 5 MG PO TABS: 5.0000 mg | ORAL_TABLET | Freq: Every evening | ORAL | Status: DC | PRN

## 2020-11-10 MED ORDER — CALCIUM CARBONATE ANTACID 500 MG PO CHEW
2.0000 | CHEWABLE_TABLET | ORAL | Status: DC | PRN
  Administered 2020-11-12 – 2020-11-26 (×12): 400 mg via ORAL
  Filled 2020-11-10 (×12): qty 2

## 2020-11-10 MED ORDER — LABETALOL HCL 100 MG PO TABS
200.0000 mg | ORAL_TABLET | Freq: Two times a day (BID) | ORAL | Status: DC
  Administered 2020-11-10 – 2020-11-12 (×4): 200 mg via ORAL
  Filled 2020-11-10 (×5): qty 2

## 2020-11-10 NOTE — Progress Notes (Addendum)
Inpatient Diabetes Program Recommendations  AACE/ADA: New Consensus Statement on Inpatient Glycemic Control (2015)  Target Ranges:  Prepandial:   less than 140 mg/dL      Peak postprandial:   less than 180 mg/dL (1-2 hours)      Critically ill patients:  140 - 180 mg/dL   Lab Results  Component Value Date   HGBA1C 5.3 01/14/2020    Review of Glycemic Control Results for [name], [name] (MRN [id number]) as of 11/10/2020 13:48  Ref. Range 11/10/2020 10:41  Glucose Latest Ref Range: 70 - 99 mg/dL 295 (H)   Diabetes history:  GDMA1   Inpatient Diabetes Program Recommendations:    Marisa Campbell, Marisa Campbell, Marisa Campbell evaluation.  Did not pass 2 hour glucose tolerance test for the second time on 4/13.  She has been educated by DM educator from [facility] office.  She checks her blood sugar fasting and 2 hrs post prandial.  She states her fasting CBG's have been around 93-95 mg/dL and 2 hour post prandials 110 mg/dL or less.  She has eliminated bread and sugary beverages from her diet.  She has been eating dinner later in the evening and asking how she can bring her fasting CBG down more.  Explained she could eat a bit earlier and when able she could get some physical activity such as walking daily.    Received first dose of betamethasone at 1300 today.  Explained that this medication is a steroid and will likely elevate her blood sugar.  We will be checking her CBG's and may need to administer insulin.  She verbalizes understanding.  Please check CBG's fasting and 2 hours post prandial.  If CBG's become elevated please consider:  Glycemic control order set for pregnancy- Novolog 0-14 units TID after meals and HS.  Will continue to follow while inpatient.  Thank you, Marisa Sellar, RN, BSN Diabetes Coordinator Inpatient Diabetes Program [phone] (team pager from 8a-5p)

## 2020-11-10 NOTE — MAU Note (Signed)
Marisa Campbell is a 40 y.o. at [redacted]w[redacted]d here in MAU reporting: sent over from the office for severe range BP. Denies headache, visual changes, and RUQ pain. Having irregular contractions. No bleeding or LOF. +FM  Onset of complaint: today  Pain score: 0/10  Vitals:   11/10/20 1028  BP: (!) 154/81  Pulse: 60  Resp: 16  Temp: 98.4 F (36.9 C)  SpO2: 98%     FHT: EFM applied in room  Lab orders placed from triage: UA

## 2020-11-10 NOTE — Progress Notes (Signed)
   PRENATAL VISIT NOTE  Subjective:  Marisa Campbell is a 40 y.o. [phone] at [redacted]w[redacted]d being seen today for ongoing prenatal care.  She is currently monitored for the following issues for this high-risk pregnancy and has COVID-19 virus infection; Red blood cell antibody positive; Low grade squamous intraepithelial lesion on cytologic smear of cervix (LGSIL); CIN I (cervical intraepithelial neoplasia I); Hypothyroidism; Supervision of high-risk pregnancy; Essential hypertension affecting pregnancy in third trimester; Advanced maternal age in multigravida; History of 2 cesarean sections; Maternal morbid obesity, antepartum (HCC); Gestational diabetes; Carpal tunnel syndrome of right wrist; Grand multiparity with current pregnancy; and [redacted] weeks gestation of pregnancy on their problem list.  Patient doing well with no acute concerns today. She reports no complaints.  Contractions: Irritability. Vag. Bleeding: None.  Movement: Present. Denies leaking of fluid.   The following portions of the patient's history were reviewed and updated as appropriate: allergies, current medications, past family history, past medical history, past social history, past surgical history and problem list. Problem list updated.  Objective:   Vitals:   11/10/20 0841 11/10/20 0909  BP: (!) 167/96 (!) 173/97  Pulse: (!) 56 (!) 57  Weight: (!) 315 lb 11.2 oz (143.2 kg)    Repeat BP 173/97  Fetal Status: Fetal Heart Rate (bpm): 135   Movement: Present     General:  Alert, oriented and cooperative. Patient is in no acute distress.  Skin: Skin is warm and dry. No rash noted.   Cardiovascular: Normal heart rate noted  Respiratory: Normal respiratory effort, no problems with respiration noted  Abdomen: Soft, gravid, appropriate for gestational age.  Pain/Pressure: Present     Pelvic: Cervical exam deferred        Extremities: Normal range of motion.  Edema: Mild pitting, slight indentation  Mental Status:  Normal mood and  affect. Normal behavior. Normal judgment and thought content.   Assessment and Plan:  Pregnancy: [id number] at [redacted]w[redacted]d  1. Grand multiparity with current pregnancy in third trimester   2. Gestational diabetes mellitus (GDM), antepartum, gestational diabetes method of control unspecified Diet controlled gestational diabetes FBS: 83-114 PPBS: 90-117  3. History of 2 cesarean sections At this time repeat c section  4. Supervision of high risk pregnancy in third trimester Continue routine care  5. Essential hypertension affecting pregnancy in third trimester Pt continues with labetalol, BP in severe range today, advised MAU evaluation  6. Hypothyroidism, unspecified type Continue thyroid replacement  7. CIN I (cervical intraepithelial neoplasia I)   8. Red blood cell antibody positive Continue weekly fetal testing  9. Multigravida of advanced maternal age in third trimester   35. [redacted] weeks gestation of pregnancy   Preterm labor symptoms and general obstetric precautions including but not limited to vaginal bleeding, contractions, leaking of fluid and fetal movement were reviewed in detail with the patient.  Please refer to After Visit Summary for other counseling recommendations.   Pt to [facility] for further evaluation and management  Return in about 2 weeks (around 11/24/2020) for [facility], in person.   Mariel Aloe, MD Faculty Attending Center for [facility]

## 2020-11-10 NOTE — Progress Notes (Signed)
Pt reports fetal movement with occasional contractions. Pt denies headaches, dizziness, blurred, vision.

## 2020-11-10 NOTE — H&P (Signed)
[name] is a 40 y.o. [id number] at [redacted]w[redacted]d who presents to MAU for preeclampsia evaluation after she was seen in the office and found to have multiple severe range pressures and was sent to MAU for further evaluation. Patient reports she does have a known hx of cHTN and is on labetalol 200mg  BID which she took about prior to going in to the office today.  Pt denies HA, blurry vision/seeing spots, N/V, epigastric pain, swelling in face and hands, sudden weight gain. Pt denies chest pain and SOB.  Pt denies constipation, diarrhea, or urinary problems. Pt denies fever, chills, fatigue, sweating or changes in appetite. Pt denies dizziness, light-headedness, weakness.  Pt denies VB, ctx, LOF and reports good FM.  Current pregnancy problems? Hx isoimmunization, Anti-C, Anti-E, GDM, AMA, hx C/S x2, hypothyroid Blood Type? AB Positive Allergies? NKDA Current medications? Low dose aspirin, pepcid, labetalol, sythroid Current PNC & next appt? MFM, 11/15/2020   OB History    Gravida  7   Para  5   Term  4   Preterm  1   AB  1   Living  5     SAB  0   IAB  1   Ectopic      Multiple  0   Live Births  5          Past Medical History:  Diagnosis Date  . Anemia   . COVID-19 01/31/2019  . Headache   . 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 Hypothyroidism    on meds currently  . Vaginal Pap smear, abnormal    Past Surgical History:  Procedure Laterality Date  . CESAREAN SECTION    . CESAREAN SECTION N/A 06/01/2019   Procedure: CESAREAN SECTION;  Surgeon: 06/03/2019, MD;  Location: [facility] LD ORS;  Service: Obstetrics;  Laterality: N/A;   Family History: family history includes Hypertension in her father. Social History:  reports that she has never smoked. She has never used smokeless tobacco. She reports that she does not drink alcohol and does not use drugs.     Maternal Diabetes: Yes:  Diabetes Type:  Diet  controlled Genetic Screening: Normal Maternal Ultrasounds/Referrals: Normal Fetal Ultrasounds or other Referrals:  None Maternal Substance Abuse:  No Significant Maternal Medications:  Meds include: Syntroid Other: low dose ASA, pepcid, labetalol Significant Maternal Lab Results:  Other:  Anti-c (titer 1:32), + Anti -E Other Comments:  None  Review of Systems  Review of Systems  Constitutional: Negative for chills, diaphoresis, fatigue and fever.  Eyes: Negative for visual disturbance.  Respiratory: Negative for shortness of breath.   Cardiovascular: Negative for chest pain.  Gastrointestinal: Negative for abdominal pain, constipation, diarrhea, nausea and vomiting.  Genitourinary: Negative for dysuria, flank pain, frequency, pelvic pain, urgency, vaginal bleeding and vaginal discharge.  Neurological: Negative for dizziness, weakness, light-headedness and headaches.   History   Blood pressure (!) 168/71, pulse 62, temperature 98.4 F (36.9 C), temperature source Oral, resp. rate 16, last menstrual period 03/30/2020, SpO2 98 %, currently breastfeeding.   Patient Vitals for the past 24 hrs:  BP Temp Temp src Pulse Resp SpO2  11/10/20 1212 (!) 168/71 -- -- 62 -- --  11/10/20 1210 -- -- -- -- -- 98 %  11/10/20 1141 (!) 154/84 -- -- (!) 57 -- --  11/10/20 1140 -- -- -- -- -- 96 %  11/10/20 1131 (!) 144/69 -- -- (!) 53 -- --  11/10/20 1115 139/68 -- -- (!) 53 -- 98 %  11/10/20 1101 (!) 160/81 -- -- (!) 57 -- --  11/10/20 1100 -- -- -- -- -- 97 %  11/10/20 1048 (!) 164/82 -- -- (!) 58 -- --  11/10/20 1045 -- -- -- -- -- 98 %  11/10/20 1028 (!) 154/81 98.4 F (36.9 C) Oral 60 16 98 %   Exam Physical Exam  Physical Exam Vitals and nursing note reviewed.  Constitutional:      General: She is not in acute distress.    Appearance: Normal appearance. She is not ill-appearing, toxic-appearing or diaphoretic.  HENT:     Head: Normocephalic and atraumatic.  Pulmonary:     Effort:  Pulmonary effort is normal.  Neurological:     Mental Status: She is alert and oriented to person, place, and time.  Psychiatric:        Mood and Affect: Mood normal.        Behavior: Behavior normal.        Thought Content: Thought content normal.        Judgment: Judgment normal.   EFM: reactive       -baseline: 140       -variability: moderate       -accels: present, 15x15       -decels: few variables       -TOCO: quiet   Prenatal labs: ABO, Rh: --/--/AB POS (05/03 2204) Antibody: POS (05/03 2204) Rubella: Immune (11/18 0000) RPR: Non Reactive (04/07 0918)  HBsAg: Negative (11/18 0000)  HIV: Non Reactive (04/07 0918)  GBS:     Assessment/Plan: 1. Chronic hypertension with superimposed pre-eclampsia   2. Grand multiparity with current pregnancy in third trimester   3. Gestational diabetes mellitus (GDM), antepartum, gestational diabetes method of control unspecified   4. History of 2 cesarean sections   5. Supervision of high risk pregnancy in third trimester   6. [redacted] weeks gestation of pregnancy   7. NST (non-stress test) reactive    -admit to [facility]   [name] 11/10/2020, 12:17 PM

## 2020-11-11 DIAGNOSIS — O1413 Severe pre-eclampsia, third trimester: Secondary | ICD-10-CM

## 2020-11-11 LAB — GLUCOSE, CAPILLARY
Glucose-Capillary: 108 mg/dL — ABNORMAL HIGH (ref 70–99)
Glucose-Capillary: 111 mg/dL — ABNORMAL HIGH (ref 70–99)
Glucose-Capillary: 154 mg/dL — ABNORMAL HIGH (ref 70–99)

## 2020-11-11 NOTE — Progress Notes (Addendum)
Daily Antepartum Note  Admission Date: 11/10/2020 Current Date: 11/11/2020 5:09 AM  Marisa Campbell is a 40 y.o. [id number] @ [redacted]w[redacted]d, HD#2, admitted for BP control vs severe pre-eclampsia superimposed on cHTN.  Pregnancy complicated by: Patient Active Problem List   Diagnosis Date Noted  . Severe preeclampsia, third trimester 11/10/2020  . Grand multiparity with current pregnancy 10/27/2020  . Carpal tunnel syndrome of right wrist 10/19/2020  . Gestational diabetes 10/13/2020  . Maternal morbid obesity, antepartum (HCC) 10/12/2020  . History of 2 cesarean sections 09/14/2020  . Advanced maternal age in multigravida 09/07/2020  . Essential hypertension affecting pregnancy in third trimester 08/17/2020  . Supervision of high-risk pregnancy 08/16/2020  . Hypothyroidism 08/13/2019  . 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 CIN I (cervical intraepithelial neoplasia I) 05/24/2016  . Low grade squamous intraepithelial lesion on cytologic smear of cervix (LGSIL) 05/17/2016    Overnight/24hr events:  none  Subjective:  No s/s of pre-eclampsia  Objective:    Current Vital Signs 24h Vital Sign Ranges  T 97.9 F (36.6 C) Temp  Avg: 98.2 F (36.8 C)  Min: 97.9 F (36.6 C)  Max: 98.4 F (36.9 C)  BP (!) 131/58 BP  Min: 110/70  Max: 173/97  HR 62 Pulse  Avg: 59.8  Min: 49  Max: 71  RR 17 Resp  Avg: 17.1  Min: 16  Max: 19  SaO2 97 % Room Air SpO2  Avg: 98.4 %  Min: 96 %  Max: 100 %       24 Hour I/O Current Shift I/O  Time Ins Outs 05/06 0701 - 05/07 0700 In: 4070 [P.O.:2520; I.V.:1550] Out: 3150 [Urine:3150] 05/06 1901 - 05/07 0700 In: 2439.2 [P.O.:1440; I.V.:999.2] Out: 2350 [Urine:2350]   Patient Vitals for the past 24 hrs:  BP Temp Temp src Pulse Resp SpO2  11/11/20 0500 -- -- -- -- 17 --  11/11/20 0400 (!) 131/58 97.9 F (36.6 C) Oral 62 18 97 %  11/11/20 0300 -- -- -- -- 17 --  11/11/20 0200 -- -- -- -- 16 --  11/11/20 0055 -- -- -- -- 17 --  11/11/20 0000 -- --  -- -- 16 --  11/10/20 2324 135/70 98.1 F (36.7 C) Oral (!) 58 17 100 %  11/10/20 2300 -- -- -- -- 17 --  11/10/20 2200 -- -- -- -- 18 --  11/10/20 2128 (!) 156/87 -- -- 67 19 --  11/10/20 2002 (!) 152/84 -- -- 67 17 --  11/10/20 1902 140/68 98.2 F (36.8 C) Oral 67 18 98 %  11/10/20 1806 (!) 149/73 -- -- 65 -- --  11/10/20 1800 -- -- -- -- 16 --  11/10/20 1755 -- -- -- -- -- 96 %  11/10/20 1702 (!) 143/67 -- -- 65 -- --  11/10/20 1700 -- -- -- -- 17 --  11/10/20 1600 139/72 98.2 F (36.8 C) Oral 71 18 98 %  11/10/20 1550 -- -- -- -- 17 --  11/10/20 1502 135/70 -- -- 60 16 99 %  11/10/20 1405 -- -- -- -- -- 100 %  11/10/20 1402 110/70 -- -- 62 17 --  11/10/20 1400 -- -- -- -- -- 99 %  11/10/20 1355 -- -- -- -- -- 100 %  11/10/20 1345 -- -- -- -- -- 100 %  11/10/20 1342 (!) 144/71 -- -- 60 -- --  11/10/20 1340 -- -- -- -- -- 99 %  11/10/20 1335 -- -- -- -- -- 99 %  11/10/20 1331 (!) 143/72 -- -- (!) 57 -- --  11/10/20 1330 -- -- -- -- 17 --  11/10/20 1322 (!) 148/76 -- -- (!) 49 -- --  11/10/20 1310 -- -- -- -- 18 100 %  11/10/20 1251 (!) 144/79 -- -- (!) 57 -- --  11/10/20 1250 -- -- -- -- -- 98 %  11/10/20 1221 (!) 147/69 -- -- (!) 55 -- --  11/10/20 1212 (!) 168/71 -- -- 62 -- --  11/10/20 1210 -- -- -- -- -- 98 %  11/10/20 1141 (!) 154/84 -- -- (!) 57 -- --  11/10/20 1140 -- -- -- -- -- 96 %  11/10/20 1131 (!) 144/69 -- -- (!) 53 -- --  11/10/20 1115 139/68 -- -- (!) 53 -- 98 %  11/10/20 1101 (!) 160/81 -- -- (!) 57 -- --  11/10/20 1100 -- -- -- -- -- 97 %  11/10/20 1048 (!) 164/82 -- -- (!) 58 -- --  11/10/20 1045 -- -- -- -- -- 98 %  11/10/20 1028 (!) 154/81 98.4 F (36.9 C) Oral 60 16 98 %   FHT: 130 baseline, +accels, no decel, mod variability Tocometry: no contractiosn  Physical exam: General: Well nourished, well developed female in no acute distress. Abdomen: obese, nttp Cardiovascular: S1, S2 normal, no murmur, rub or gallop, regular rate and  rhythm Respiratory: CTAB Extremities: no clubbing, cyanosis or edema Skin: Warm and dry.   Medications: Current Facility-Administered Medications  Medication Dose Route Frequency Provider Last Rate Last Admin  . acetaminophen (TYLENOL) tablet 650 mg  650 mg Oral Q4H PRN Anyanwu, Ugonna A, MD      . aspirin EC tablet 81 mg  81 mg Oral Daily Anyanwu, Ugonna A, MD      . betamethasone acetate-betamethasone sodium phosphate (CELESTONE) injection 12 mg  12 mg Intramuscular Q24H Anyanwu, Ugonna A, MD   12 mg at 11/10/20 1312  . calcium carbonate (TUMS - dosed in mg elemental calcium) chewable tablet 400 mg of elemental calcium  2 tablet Oral Q4H PRN Anyanwu, Ugonna A, MD      . docusate sodium (COLACE) capsule 100 mg  100 mg Oral BID PRN [place] Bing, MD      . famotidine (PEPCID) tablet 20 mg  20 mg Oral Daily Anyanwu, Ugonna A, MD      . labetalol (NORMODYNE) injection 20 mg  20 mg Intravenous PRN Anyanwu, Ugonna A, MD   20 mg at 11/10/20 1114   And  . labetalol (NORMODYNE) injection 40 mg  40 mg Intravenous PRN Anyanwu, Ugonna A, MD   40 mg at 11/10/20 1214   And  . labetalol (NORMODYNE) injection 80 mg  80 mg Intravenous PRN Anyanwu, Ugonna A, MD       And  . hydrALAZINE (APRESOLINE) injection 10 mg  10 mg Intravenous PRN Anyanwu, Ugonna A, MD      . labetalol (NORMODYNE) tablet 200 mg  200 mg Oral Q12H Anyanwu, Ugonna A, MD   200 mg at 11/10/20 2239  . lactated ringers infusion   Intravenous Continuous [place] Bing, MD 50 mL/hr at 11/10/20 1900 Infusion Verify at 11/10/20 1900  . levothyroxine (SYNTHROID) tablet 137 mcg  137 mcg Oral QAC breakfast Anyanwu, Ugonna A, MD      . magnesium sulfate 40 grams in SWI 1000 mL OB infusion  2 g/hr Intravenous Titrated [place] Bing, MD 50 mL/hr at 11/10/20 1900 2 g/hr at 11/10/20 1900  . prenatal multivitamin tablet 1 tablet  1 tablet Oral Q1200 Anyanwu, Jethro Bastos, MD   1 tablet at 11/11/20 0045  . valACYclovir (VALTREX) tablet 500 mg  500 mg  Oral BID [place] Bing, MD        Labs:  AB POS +big E and little c  Recent Labs  Lab 11/07/20 2204 11/10/20 1041  WBC 9.4 8.0  HGB 12.6 11.6*  HCT 36.4 33.5*  PLT 214 188    Recent Labs  Lab 11/07/20 2204 11/10/20 1041  NA 133* 134*  K 3.8 4.0  CL 107 106  CO2 21* 24  BUN 13 11  CREATININE 0.66 0.67  CALCIUM 9.2 8.7*  PROT 6.4* 5.8*  BILITOT 0.4 0.4  ALKPHOS 79 72  ALT 37 34  AST 36 32  GLUCOSE 82 107*   PCR: 420 (102 on 08/17/20)  Results for [facility], Azaylah A (MRN [id number]) as of 11/11/2020 05:14  Ref. Range 11/10/2020 17:01 11/10/2020 21:22  Glucose-Capillary Latest Ref Range: 70 - 99 mg/dL 127 (H) 517 (H)    Radiology:  No new imaging 5/3: breech, afi 7.53, 1521gm, 6%, ac 14%, normal UA and MCA dopplers  Assessment & Plan:  Pt doing well *Pregnancy: category I tracing with accels *CV: I told her it's unclear if she has severe pre-eclampsia based on BPs or if she just needed BP control since she's controlled on just labetalol 200 bid; she was on 100 bid at home. 24h urine protein ordered and I told her we'll have more information tomorrow and can make a decision but if she is able to go home then she needs to be followed very closely.  *Preterm: bmz #2 at 1300 today. Consult nicu if has severe pre-eclampsia and/or for delivery *GDM: watch CBGs. May need to be on something temporarily given bmz course.  *Alloisommunization: getting weekly dopplers with MFM. Next one due next week.  *FGR: see above.  *H/o c-section and VBAC: can d/w patient once closer to delivery re: plan.  *BMI 50s *h/o HSV: valtrex ppx ordered *AMA: no issues *PPx: SCDs *FEN/GI: GDM, MIVF with Mg *Dispo: see above  Cornelia Copa MD Attending Center for [facility] (Faculty Practice) GYN Consult Phone: [phone] (M-F, [phone]) & [phone] (Off hours, weekends, holidays)

## 2020-11-11 NOTE — Plan of Care (Signed)
  Problem: Education: Goal: Knowledge of General Education information will improve Description: Including pain rating scale, medication(s)/side effects and non-pharmacologic comfort measures Outcome: Completed/Met   Problem: Activity: Goal: Risk for activity intolerance will decrease Outcome: Completed/Met   Problem: Nutrition: Goal: Adequate nutrition will be maintained Outcome: Completed/Met   Problem: Coping: Goal: Level of anxiety will decrease Outcome: Completed/Met   Problem: Elimination: Goal: Will not experience complications related to urinary retention Outcome: Completed/Met   Problem: Education: Goal: Knowledge of disease or condition will improve Outcome: Completed/Met Goal: Knowledge of the prescribed therapeutic regimen will improve Outcome: Completed/Met   Problem: Education: Goal: Knowledge of disease or condition will improve Outcome: Completed/Met Goal: Knowledge of the prescribed therapeutic regimen will improve Outcome: Completed/Met

## 2020-11-11 NOTE — Progress Notes (Signed)
Pt. fasting CBG @0733  was 99. Data not sent from device to flowsheet due to technical error.

## 2020-11-12 ENCOUNTER — Encounter (HOSPITAL_COMMUNITY): Payer: Self-pay | Admitting: Obstetrics & Gynecology

## 2020-11-12 DIAGNOSIS — O113 Pre-existing hypertension with pre-eclampsia, third trimester: Secondary | ICD-10-CM

## 2020-11-12 DIAGNOSIS — Z3A32 32 weeks gestation of pregnancy: Secondary | ICD-10-CM

## 2020-11-12 DIAGNOSIS — O10013 Pre-existing essential hypertension complicating pregnancy, third trimester: Secondary | ICD-10-CM | POA: Diagnosis not present

## 2020-11-12 DIAGNOSIS — O99213 Obesity complicating pregnancy, third trimester: Secondary | ICD-10-CM

## 2020-11-12 DIAGNOSIS — O09523 Supervision of elderly multigravida, third trimester: Secondary | ICD-10-CM

## 2020-11-12 DIAGNOSIS — O365930 Maternal care for other known or suspected poor fetal growth, third trimester, not applicable or unspecified: Secondary | ICD-10-CM | POA: Diagnosis present

## 2020-11-12 LAB — CREATININE, URINE, 24 HOUR
Collection Interval-UCRE24: 24 hours
Creatinine, 24H Ur: 2029 mg/d — ABNORMAL HIGH (ref 600–1800)
Creatinine, Urine: 44.11 mg/dL
Urine Total Volume-UCRE24: 4600 mL

## 2020-11-12 LAB — PROTEIN, URINE, 24 HOUR
Collection Interval-UPROT: 24 hours
Protein, 24H Urine: 690 mg/d — ABNORMAL HIGH (ref 50–100)
Protein, Urine: 15 mg/dL
Urine Total Volume-UPROT: 4600 mL

## 2020-11-12 LAB — GLUCOSE, CAPILLARY
Glucose-Capillary: 99 mg/dL (ref 70–99)
Glucose-Capillary: 100 mg/dL — ABNORMAL HIGH (ref 70–99)
Glucose-Capillary: 103 mg/dL — ABNORMAL HIGH (ref 70–99)
Glucose-Capillary: 95 mg/dL (ref 70–99)
Glucose-Capillary: 112 mg/dL — ABNORMAL HIGH (ref 70–99)

## 2020-11-12 MED ORDER — NIFEDIPINE ER OSMOTIC RELEASE 30 MG PO TB24: 30.0000 mg | ORAL_TABLET | Freq: Two times a day (BID) | ORAL | Status: DC

## 2020-11-12 MED ORDER — NIFEDIPINE ER OSMOTIC RELEASE 30 MG PO TB24
30.0000 mg | ORAL_TABLET | Freq: Two times a day (BID) | ORAL | Status: DC
  Administered 2020-11-12 – 2020-11-24 (×25): 30 mg via ORAL
  Filled 2020-11-12 (×27): qty 1

## 2020-11-12 MED ORDER — METFORMIN HCL 500 MG PO TABS: 500.0000 mg | ORAL_TABLET | Freq: Two times a day (BID) | ORAL | Status: DC

## 2020-11-12 MED ORDER — INSULIN ASPART 100 UNIT/ML IJ SOLN
0.0000 [IU] | Freq: Three times a day (TID) | INTRAMUSCULAR | Status: DC
  Administered 2020-11-17 – 2020-11-22 (×7): 1 [IU] via SUBCUTANEOUS

## 2020-11-12 MED ORDER — INSULIN ASPART 100 UNIT/ML IJ SOLN: 0.0000 [IU] | Freq: Three times a day (TID) | INTRAMUSCULAR | Status: DC

## 2020-11-12 MED ORDER — INSULIN GLARGINE 100 UNIT/ML ~~LOC~~ SOLN
20.0000 [IU] | Freq: Every day | SUBCUTANEOUS | Status: DC
  Filled 2020-11-12: qty 0.2

## 2020-11-12 MED ORDER — INSULIN ASPART 100 UNIT/ML IJ SOLN
0.0000 [IU] | Freq: Every day | INTRAMUSCULAR | Status: DC
  Administered 2020-11-21 – 2020-11-23 (×3): 1 [IU] via SUBCUTANEOUS

## 2020-11-12 NOTE — Consult Note (Signed)
Neonatology Consult to Antenatal Patient:  I was asked by Dr. Macon Large to see this patient in order to provide antenatal counseling due to prematurity.  Mrs. Avon Gully was admitted 11/10/20  at 32 1/[redacted] weeks GA for severe pre-eclampsia superimposed on cHTN. She is currently not having active labor and membranes are intact. She is getting BMZ and IV Propranolol and magnesium.  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 Recent oligohydramnios and growth restriction noted on [place].  Reassuring fetal status at this time.  Father supportive; at home now with their 2 younger children.   I spoke with the patient . We discussed the worst case of delivery in the next 1-2 days versus expectant management until [redacted] weeks EGA, including usual DR management, possible respiratory complications and need for support, IV access, feedings (mother desires breast feeding, which was encouraged, and she agrees to donor BM use), LOS, Mortality and Morbidity, and long term outcomes.  Questions asked were answered at this time.  She expressed understanding and a great deal of appreciation for the visit.  I/we would be glad to come back if she or father has more questions later.  Thank you for asking me to see this patient.  Dineen Kid Leary Roca, MD Neonatologist  The total length of face-to-face or floor/unit time for this encounter was 25 minutes. Counseling and/or coordination of care was 40 minutes of the above.

## 2020-11-12 NOTE — Progress Notes (Signed)
CBG (last 3)  Recent Labs    11/12/20 0744 11/12/20 1028 11/12/20 1455  GLUCAP 100* 103* 95    Lantus discontinued as recommended by DM Coordinator.   Will cover with Novolog as needed.  Continue to monitor CBGs closely.   Jaynie Collins, MD

## 2020-11-12 NOTE — Progress Notes (Signed)
Inpatient Diabetes Program Recommendations  Diabetes Treatment Program Recommendations  ADA Standards of Care  Diabetes in Pregnancy Target Glucose Ranges:  Fasting: 60 - 90 mg/dL Preprandial: 60 - 224 mg/dL 1 hr postprandial: Less than 140mg /dL (from first bite of meal) 2 hr postprandial: Less than 120 mg/dL (from first bite of meal)   Results for [facility], Lakshmi A (MRN [facility]) as of 11/12/2020 11:00  Ref. Range 11/11/2020 07:36 11/11/2020 10:53 11/11/2020 15:13 11/11/2020 19:35 11/12/2020 07:44 11/12/2020 10:28  Glucose-Capillary Latest Ref Range: 70 - 99 mg/dL 99 01/12/2021 (H) 888 (H) 916 (H) 100 (H) 103 (H)   Review of Glycemic Control  Current orders for Inpatient glycemic control: Lantus 20 units QHS, Novolog 0-14 units TID   Inpatient Diabetes Program Recommendations:    Insulin: Please consider discontinuing Lantus at this time and modify Novolog 0-14 units to QID (fasting and 2 hour post prandial).  NOTE: Glucose 99-154 mg/dl on 945 and second dose of Betamethasone given 11/11/20. No insulin given on 11/11/20 and noted new orders for Lantus and Novolog correction scale. Would recommend not ordering Lantus at this time given that fasting glucose 100 mg/dl today and no other steroids are ordered.  Thanks, 01/11/21, RN, MSN, CDE Diabetes Coordinator Inpatient Diabetes Program [phone] (Team Pager from 8am to 5pm)

## 2020-11-12 NOTE — Progress Notes (Signed)
Daily Antepartum Note  Admission Date: 11/10/2020 Current Date: 11/12/2020 10:17 AM  Marisa Campbell is a 40 y.o. [id number] @ [redacted]w[redacted]d, HD#3, admitted for BP control in the setting of severe pre-eclampsia superimposed on cHTN.  Pregnancy complicated by: Patient Active Problem List   Diagnosis Date Noted  . Severe preeclampsia, third trimester 11/10/2020  . Grand multiparity with current pregnancy 10/27/2020  . Carpal tunnel syndrome of right wrist 10/19/2020  . Gestational diabetes 10/13/2020  . Maternal morbid obesity, antepartum (HCC) 10/12/2020  . History of 2 cesarean sections 09/14/2020  . Advanced maternal age in multigravida 09/07/2020  . Chronic hypertension with superimposed severe preeclampsia 08/17/2020  . Supervision of high-risk pregnancy 08/16/2020  . Hypothyroidism 08/13/2019  . 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 CIN I (cervical intraepithelial neoplasia I) 05/24/2016  . Low grade squamous intraepithelial lesion on cytologic smear of cervix (LGSIL) 05/17/2016    Overnight/24hr events:  24 hour urine protein resulted with 690 mg of protein confirming diagnosis of superimposed severe preeclampsia.  Subjective:  No s/s of pre-eclampsia. Good FM, no VB, no LOF or contractions.  Objective:    Current Vital Signs 24h Vital Sign Ranges  T 98 F (36.7 C) Temp  Avg: 98.1 F (36.7 C)  Min: 98 F (36.7 C)  Max: 98.2 F (36.8 C)  BP 136/62 BP  Min: 116/60  Max: 136/62  HR 61 Pulse  Avg: 58.7  Min: 55  Max: 61  RR 20 Resp  Avg: 18.4  Min: 17  Max: 20  SaO2 98 % Room Air SpO2  Avg: 98.7 %  Min: 98 %  Max: 100 %       24 Hour I/O Current Shift I/O  Time Ins Outs 05/07 0701 - 05/08 0700 In: 3146.5 [P.O.:2500; I.V.:496.5] Out: 4650 [Urine:4650] No intake/output data recorded.   Patient Vitals for the past 24 hrs:  BP Temp Temp src Pulse Resp SpO2  11/12/20 1015 136/62 -- -- 61 -- --  11/12/20 0741 (!) 118/54 -- -- (!) 58 20 98 %  11/12/20 0740 -- -- -- --  -- 98 %  11/12/20 0400 131/66 98 F (36.7 C) Oral 61 20 100 %  11/11/20 2233 123/63 98 F (36.7 C) Oral (!) 55 20 100 %  11/11/20 1927 116/60 98.2 F (36.8 C) Oral 60 18 98 %  11/11/20 1519 136/75 98.1 F (36.7 C) Oral 61 18 98 %  11/11/20 1400 -- -- -- -- 18 --  11/11/20 1300 -- -- -- -- 18 --  11/11/20 1218 126/65 98.1 F (36.7 C) Oral (!) 55 17 99 %  11/11/20 1200 -- -- -- -- 18 --  11/11/20 1100 -- -- -- -- 17 --   FHT: 130 baseline, +accels, no decel, mod variability Tocometry: no contractiosn  Physical exam: General: Well nourished, well developed female in no acute distress. Abdomen: obese, nttp Cardiovascular: S1, S2 normal, no murmur, rub or gallop, regular rate and rhythm Respiratory: CTAB Extremities: no clubbing, cyanosis or edema Skin: Warm and dry.   Medications: Current Facility-Administered Medications  Medication Dose Route Frequency Provider Last Rate Last Admin  . acetaminophen (TYLENOL) tablet 650 mg  650 mg Oral Q4H PRN Alann Avey, Jethro Bastos, MD   650 mg at 11/11/20 0859  . aspirin EC tablet 81 mg  81 mg Oral Daily Trudi Morgenthaler A, MD   81 mg at 11/12/20 1015  . calcium carbonate (TUMS - dosed in mg elemental calcium) chewable tablet 400 mg of  elemental calcium  2 tablet Oral Q4H PRN Oluwadarasimi Favor A, MD      . docusate sodium (COLACE) capsule 100 mg  100 mg Oral BID PRN [place] Bing, MD   100 mg at 11/11/20 1638  . famotidine (PEPCID) tablet 20 mg  20 mg Oral Daily Azreal Stthomas A, MD   20 mg at 11/12/20 1015  . labetalol (NORMODYNE) injection 20 mg  20 mg Intravenous PRN Eryca Bolte A, MD   20 mg at 11/10/20 1114   And  . labetalol (NORMODYNE) injection 40 mg  40 mg Intravenous PRN Nadav Swindell A, MD   40 mg at 11/10/20 1214   And  . labetalol (NORMODYNE) injection 80 mg  80 mg Intravenous PRN Aeisha Minarik A, MD       And  . hydrALAZINE (APRESOLINE) injection 10 mg  10 mg Intravenous PRN Maddalyn Lutze A, MD      . insulin glargine  (LANTUS) injection 20 Units  20 Units Subcutaneous QHS Duane Lope H, MD      . labetalol (NORMODYNE) tablet 200 mg  200 mg Oral Q12H Isabellamarie Randa A, MD   200 mg at 11/12/20 1015  . levothyroxine (SYNTHROID) tablet 137 mcg  137 mcg Oral QAC breakfast Porche Steinberger A, MD   137 mcg at 11/12/20 0630  . prenatal multivitamin tablet 1 tablet  1 tablet Oral Q1200 Islam Eichinger, Jethro Bastos, MD   1 tablet at 11/11/20 1132  . valACYclovir (VALTREX) tablet 500 mg  500 mg Oral BID [place] Bing, MD        Labs:  AB POS +big E and little c  Recent Labs  Lab 11/07/20 2204 11/10/20 1041  WBC 9.4 8.0  HGB 12.6 11.6*  HCT 36.4 33.5*  PLT 214 188    Recent Labs  Lab 11/07/20 2204 11/10/20 1041  NA 133* 134*  K 3.8 4.0  CL 107 106  CO2 21* 24  BUN 13 11  CREATININE 0.66 0.67  CALCIUM 9.2 8.7*  PROT 6.4* 5.8*  BILITOT 0.4 0.4  ALKPHOS 79 72  ALT 37 34  AST 36 32  GLUCOSE 82 107*   11/11/2020 24 hour protein 690 (PCR was 102 on 08/17/20 and 420 on 11/10/20)   Radiology:  5/3: breech, afi 7.53, 1521gm, 6%, ac 14%, normal UA and MCA dopplers  Assessment & Plan:  Pt doing well *Severe HTN w/ CHTN: Patient was informed that given her severe preeclampsia, the recommendation would be for inpatient admission until delivery. Delivery could be due to worsening maternal-fetal status or 34 weeks.  Patient was crying when told this, has young children at home and has no help at home.  Continue Labetalol for BP control, titrate as needed.  *Preterm: Category I tracing with accels.  s/p BMZ x2. Neonatalogy consulted to talk to her about what to expect with preterm delivery. *GDM: Increased CBGs s/p BMZ.  Was going to be started on metformin, patient refused. She desires Lantus.  Was ordered for Lantus 20 units qhs, will give meal coverage as needed. Continue to watch CBGs.  *Alloisommunization: getting weekly dopplers with MFM. Next one due next week.  *FGR: see above.  *H/o c-section and VBAC: can d/w  patient once closer to delivery re: plan. She really desires VBAC.  *BMI 50s *h/o HSV: valtrex ppx ordered *AMA: no issues *PPx: SCDs *FEN/GI: GDM, MIVF with Mg *Dispo: see above  Jaynie Collins, MD Attending, Center for [facility]) GYN Consult Phone: [phone] (  M-F, 0800-1700) & [phone] (Off hours, weekends, holidays)

## 2020-11-13 DIAGNOSIS — O10013 Pre-existing essential hypertension complicating pregnancy, third trimester: Secondary | ICD-10-CM | POA: Diagnosis not present

## 2020-11-13 DIAGNOSIS — R001 Bradycardia, unspecified: Secondary | ICD-10-CM | POA: Diagnosis present

## 2020-11-13 DIAGNOSIS — O365930 Maternal care for other known or suspected poor fetal growth, third trimester, not applicable or unspecified: Secondary | ICD-10-CM | POA: Diagnosis not present

## 2020-11-13 DIAGNOSIS — O09523 Supervision of elderly multigravida, third trimester: Secondary | ICD-10-CM | POA: Diagnosis not present

## 2020-11-13 DIAGNOSIS — O113 Pre-existing hypertension with pre-eclampsia, third trimester: Secondary | ICD-10-CM | POA: Diagnosis not present

## 2020-11-13 LAB — BPAM RBC
Blood Product Expiration Date: 202205282359
Blood Product Expiration Date: 202205282359
Unit Type and Rh: 5100
Unit Type and Rh: 5100

## 2020-11-13 LAB — CBC
HCT: 34.4 % — ABNORMAL LOW (ref 36.0–46.0)
Hemoglobin: 11.7 g/dL — ABNORMAL LOW (ref 12.0–15.0)
MCH: 30.2 pg (ref 26.0–34.0)
MCHC: 34 g/dL (ref 30.0–36.0)
MCV: 88.9 fL (ref 80.0–100.0)
Platelets: 186 10*3/uL (ref 150–400)
RBC: 3.87 MIL/uL (ref 3.87–5.11)
RDW: 13.6 % (ref 11.5–15.5)
WBC: 9.5 10*3/uL (ref 4.0–10.5)
nRBC: 0.2 % (ref 0.0–0.2)

## 2020-11-13 LAB — CULTURE, BETA STREP (GROUP B ONLY)

## 2020-11-13 LAB — GLUCOSE, CAPILLARY
Glucose-Capillary: 82 mg/dL (ref 70–99)
Glucose-Capillary: 98 mg/dL (ref 70–99)
Glucose-Capillary: 82 mg/dL (ref 70–99)
Glucose-Capillary: 100 mg/dL — ABNORMAL HIGH (ref 70–99)

## 2020-11-13 LAB — TYPE AND SCREEN
ABO/RH(D): AB POS
Antibody Screen: POSITIVE
Donor AG Type: NEGATIVE
Donor AG Type: NEGATIVE
Unit division: 0
Unit division: 0

## 2020-11-13 LAB — COMPREHENSIVE METABOLIC PANEL
ALT: 41 U/L (ref 0–44)
AST: 29 U/L (ref 15–41)
Albumin: 2.6 g/dL — ABNORMAL LOW (ref 3.5–5.0)
Alkaline Phosphatase: 58 U/L (ref 38–126)
Anion gap: 6 (ref 5–15)
BUN: 15 mg/dL (ref 6–20)
CO2: 24 mmol/L (ref 22–32)
Calcium: 8.9 mg/dL (ref 8.9–10.3)
Chloride: 105 mmol/L (ref 98–111)
Creatinine, Ser: 0.68 mg/dL (ref 0.44–1.00)
GFR, Estimated: >60 mL/min (ref >60)
Glucose, Bld: 88 mg/dL (ref 70–99)
Potassium: 4.2 mmol/L (ref 3.5–5.1)
Sodium: 135 mmol/L (ref 135–145)
Total Bilirubin: 0.3 mg/dL (ref 0.3–1.2)
Total Protein: 5.7 g/dL — ABNORMAL LOW (ref 6.5–8.1)

## 2020-11-13 LAB — TSH: TSH: 3.364 u[IU]/mL (ref 0.350–4.500)

## 2020-11-13 NOTE — Progress Notes (Signed)
Daily Antepartum Note  Admission Date: 11/10/2020 Current Date: 11/13/2020 7:28 AM  Marisa Campbell is a 40 y.o. [id number] @ [redacted]w[redacted]d, HD#4, admitted for BP control in the setting of severe pre-eclampsia superimposed on cHTN.  Pregnancy complicated by: Patient Active Problem List   Diagnosis Date Noted  . Bradycardia with 51-60 beats per minute 11/13/2020  . IUGR (intrauterine growth retardation) affecting mother, third trimester 11/12/2020  . [redacted] weeks gestation of pregnancy 11/10/2020  . Severe preeclampsia, third trimester 11/10/2020  . Grand multiparity with current pregnancy 10/27/2020  . Carpal tunnel syndrome of right wrist 10/19/2020  . Gestational diabetes 10/13/2020  . Maternal morbid obesity, antepartum (HCC) 10/12/2020  . History of 2 cesarean sections 09/14/2020  . Advanced maternal age in multigravida 09/07/2020  . Chronic hypertension with superimposed severe preeclampsia 08/17/2020  . Supervision of high-risk pregnancy 08/16/2020  . Hypothyroidism 08/13/2019  . 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 CIN I (cervical intraepithelial neoplasia I) 05/24/2016  . Low grade squamous intraepithelial lesion on cytologic smear of cervix (LGSIL) 05/17/2016    Overnight/24hr events:  Antihypertensive regimen changed from Labetalol to Procardia due to bradycardia (baseline for patient, asymptomatic).  Subjective:  Patient denies any headaches, visual symptoms, RUQ/epigastric pain or other concerning symptoms.  Good FM, no VB, no LOF or contractions.  Objective:    Current Vital Signs 24h Vital Sign Ranges  T 98.4 F (36.9 C) Temp  Avg: 98.1 F (36.7 C)  Min: 97 F (36.1 C)  Max: 98.4 F (36.9 C)  BP (!) 144/74 BP  Min: 118/54  Max: 152/73  HR (!) 50 Pulse  Avg: 55.2  Min: 49  Max: 63  RR 18 Resp  Avg: 18.6  Min: 16  Max: 20  SaO2 99 %  (Room Air) SpO2  Avg: 98.3 %  Min: 98 %  Max: 99 %       24 Hour I/O Current Shift I/O  Time Ins Outs 05/08 0701 - 05/09  0700 In: 3150 [P.O.:3150] Out: 4001 [Urine:4000] 05/09 0701 - 05/09 1900 In: -  Out: 400 [Urine:400]   Patient Vitals for the past 24 hrs:  BP Temp Temp src Pulse Resp SpO2  11/13/20 0720 (!) 144/74 98.4 F (36.9 C) Oral (!) 50 18 99 %  11/13/20 0547 (!) 142/75 98.3 F (36.8 C) Oral (!) 49 20 99 %  11/12/20 2316 (!) 152/73 (!) 97 F (36.1 C) Axillary (!) 52 16 98 %  11/12/20 2222 -- -- -- (!) 51 -- --  11/12/20 1947 (!) 144/72 98.2 F (36.8 C) Oral (!) 52 18 98 %  11/12/20 1645 136/77 98.3 F (36.8 C) Oral (!) 55 20 98 %  11/12/20 1106 133/71 98.2 F (36.8 C) Oral 63 18 98 %  11/12/20 1017 136/62 -- -- 61 -- --  11/12/20 1015 136/62 -- -- 61 -- --  11/12/20 0741 (!) 118/54 -- -- (!) 58 20 98 %  11/12/20 0740 -- -- -- -- -- 98 %   FHT: 130 baseline, +accels, no decel, mod variability Tocometry: no contractiosn  Physical exam: General: Well nourished, well developed female in no acute distress. Abdomen: obese, nttp Cardiovascular: decreased rate Respiratory: CTAB Extremities: no clubbing, cyanosis or edema Skin: Warm and dry.   Medications: Current Facility-Administered Medications  Medication Dose Route Frequency Provider Last Rate Last Admin  . acetaminophen (TYLENOL) tablet 650 mg  650 mg Oral Q4H PRN Eliyah Mcshea, Jethro Bastos, MD   650 mg at 11/11/20 0859  .  aspirin EC tablet 81 mg  81 mg Oral Daily Tonni Mansour A, MD   81 mg at 11/12/20 1015  . calcium carbonate (TUMS - dosed in mg elemental calcium) chewable tablet 400 mg of elemental calcium  2 tablet Oral Q4H PRN Paralee Pendergrass A, MD   400 mg of elemental calcium at 11/12/20 2222  . docusate sodium (COLACE) capsule 100 mg  100 mg Oral BID PRN [place] Bing, MD   100 mg at 11/11/20 1638  . famotidine (PEPCID) tablet 20 mg  20 mg Oral Daily Aldin Drees A, MD   20 mg at 11/12/20 1015  . labetalol (NORMODYNE) injection 20 mg  20 mg Intravenous PRN Thressa Shiffer A, MD   20 mg at 11/10/20 1114   And  . labetalol  (NORMODYNE) injection 40 mg  40 mg Intravenous PRN Layken Beg, Jethro Bastos, MD   40 mg at 11/10/20 1214   And  . labetalol (NORMODYNE) injection 80 mg  80 mg Intravenous PRN Kylyn Mcdade A, MD       And  . hydrALAZINE (APRESOLINE) injection 10 mg  10 mg Intravenous PRN Muaaz Brau A, MD      . insulin aspart (novoLOG) injection 0-14 Units  0-14 Units Subcutaneous TID PC Kaison Mcparland A, MD      . insulin aspart (novoLOG) injection 0-14 Units  0-14 Units Subcutaneous QAC breakfast Shyann Hefner A, MD      . levothyroxine (SYNTHROID) tablet 137 mcg  137 mcg Oral QAC breakfast Genia Perin, Jethro Bastos, MD   137 mcg at 11/13/20 0547  . NIFEdipine (PROCARDIA-XL/NIFEDICAL-XL) 24 hr tablet 30 mg  30 mg Oral BID Shanica Castellanos A, MD   30 mg at 11/12/20 2315  . prenatal multivitamin tablet 1 tablet  1 tablet Oral Q1200 Odean Mcelwain A, MD   1 tablet at 11/12/20 1235  . valACYclovir (VALTREX) tablet 500 mg  500 mg Oral BID [place] Bing, MD        Labs:  AB POS +big E and little c  Recent Labs  Lab 11/07/20 2204 11/10/20 1041  WBC 9.4 8.0  HGB 12.6 11.6*  HCT 36.4 33.5*  PLT 214 188    Recent Labs  Lab 11/07/20 2204 11/10/20 1041  NA 133* 134*  K 3.8 4.0  CL 107 106  CO2 21* 24  BUN 13 11  CREATININE 0.66 0.67  CALCIUM 9.2 8.7*  PROT 6.4* 5.8*  BILITOT 0.4 0.4  ALKPHOS 79 72  ALT 37 34  AST 36 32  GLUCOSE 82 107*   11/11/2020 24 hour protein 690 (PCR was 102 on 08/17/20 and 420 on 11/10/20)   Radiology:  5/3: breech, afi 7.53, 1521gm, 6%, ac 14%, normal UA and MCA dopplers  Assessment & Plan:  Pt doing well *Severe HTN w/ CHTN: Patient was informed that given her severe preeclampsia, the recommendation would be for inpatient admission until delivery. Delivery could be due to worsening maternal-fetal status or 34 weeks.  Patient was crying when told this, has young children at home and has no help at home.  Continue Nifedipine for BP control, titrate as needed.  Avoid  Labetalol for now.  Getting weekly BPP as per MFM.  Labs to be checked today, will recheck q72h. *Bradycardia : Asymptomatic, will obtain EKG this morning. Patient said this is chronic, her doctor told her that this is likely to do with her hypothyroidism. TSH to be checked today.  *Preterm: Category I tracing.  s/p BMZ x2.  She is s/p Neonatalogy consult, appreciate their input. *GDM: Increased CBGs s/p BMZ.  Was going to be started on Metformin, patient refused. She desired Lantus instead.  Was ordered for Lantus 20 units qhs and Novolog meal coverage as needed. CBGs were in the low 100s, meal coverage continued but Lantus discontinued as per recommendations of DM Coordinator. Will continue to monitor CBGs.  *Alloisommunization: getting weekly dopplers/BPP with MFM. Next one due 5/10, this was ordered.  *FGR: see above.  *H/o c-section and VBAC: can d/w patient once closer to delivery re: plan. She really desires VBAC. She is aware if breech presentation on 5/3 scan  *BMI 50s *h/o HSV: valtrex ppx ordered *AMA: no issues *PPx: SCDs *FEN/GI: GDM, MIVF with Mg *Dispo:patient is inpatient until delivery given severe preeclampsia  Jaynie Collins, MD Attending, [facility] (Faculty Practice) GYN Consult Phone: [phone] (M-F, 0800-1700) & [phone] (Off hours, weekends, holidays)

## 2020-11-14 ENCOUNTER — Encounter: Payer: Self-pay | Admitting: Women's Health

## 2020-11-14 ENCOUNTER — Inpatient Hospital Stay (HOSPITAL_BASED_OUTPATIENT_CLINIC_OR_DEPARTMENT_OTHER): Payer: Medicaid Other

## 2020-11-14 DIAGNOSIS — O365930 Maternal care for other known or suspected poor fetal growth, third trimester, not applicable or unspecified: Secondary | ICD-10-CM

## 2020-11-14 DIAGNOSIS — E669 Obesity, unspecified: Secondary | ICD-10-CM

## 2020-11-14 DIAGNOSIS — Z3A32 32 weeks gestation of pregnancy: Secondary | ICD-10-CM

## 2020-11-14 DIAGNOSIS — O361930 Maternal care for other isoimmunization, third trimester, not applicable or unspecified: Secondary | ICD-10-CM

## 2020-11-14 DIAGNOSIS — E039 Hypothyroidism, unspecified: Secondary | ICD-10-CM

## 2020-11-14 DIAGNOSIS — O99283 Endocrine, nutritional and metabolic diseases complicating pregnancy, third trimester: Secondary | ICD-10-CM | POA: Diagnosis not present

## 2020-11-14 DIAGNOSIS — O99213 Obesity complicating pregnancy, third trimester: Secondary | ICD-10-CM

## 2020-11-14 DIAGNOSIS — O09213 Supervision of pregnancy with history of pre-term labor, third trimester: Secondary | ICD-10-CM

## 2020-11-14 DIAGNOSIS — B951 Streptococcus, group B, as the cause of diseases classified elsewhere: Secondary | ICD-10-CM | POA: Insufficient documentation

## 2020-11-14 DIAGNOSIS — O98819 Other maternal infectious and parasitic diseases complicating pregnancy, unspecified trimester: Secondary | ICD-10-CM | POA: Insufficient documentation

## 2020-11-14 DIAGNOSIS — O24419 Gestational diabetes mellitus in pregnancy, unspecified control: Secondary | ICD-10-CM | POA: Diagnosis not present

## 2020-11-14 DIAGNOSIS — O34219 Maternal care for unspecified type scar from previous cesarean delivery: Secondary | ICD-10-CM

## 2020-11-14 DIAGNOSIS — O09523 Supervision of elderly multigravida, third trimester: Secondary | ICD-10-CM | POA: Diagnosis not present

## 2020-11-14 DIAGNOSIS — O113 Pre-existing hypertension with pre-eclampsia, third trimester: Secondary | ICD-10-CM | POA: Diagnosis not present

## 2020-11-14 DIAGNOSIS — O10913 Unspecified pre-existing hypertension complicating pregnancy, third trimester: Secondary | ICD-10-CM | POA: Diagnosis not present

## 2020-11-14 LAB — GLUCOSE, CAPILLARY
Glucose-Capillary: 84 mg/dL (ref 70–99)
Glucose-Capillary: 91 mg/dL (ref 70–99)
Glucose-Capillary: 91 mg/dL (ref 70–99)
Glucose-Capillary: 101 mg/dL — ABNORMAL HIGH (ref 70–99)

## 2020-11-14 MED ORDER — BUTALBITAL-APAP-CAFFEINE 50-325-40 MG PO TABS
1.0000 | ORAL_TABLET | Freq: Four times a day (QID) | ORAL | Status: DC | PRN
  Administered 2020-11-14: 1 via ORAL
  Filled 2020-11-14: qty 1

## 2020-11-14 NOTE — Progress Notes (Signed)
Patient ID: Marisa Campbell, female   DOB: Oct 04, 1980, 40 y.o.   MRN: [id number] [facility] ANTEPARTUM COMPREHENSIVE PROGRESS NOTE  Marisa Campbell is a 40 y.o. [id number] at [redacted]w[redacted]d  who is admitted for [facility] with severe SIPEC.   Fetal presentation is cephalic. Length of Stay:  4  Days  Subjective: Pt without complaints today. She denies HA or visual changes Patient reports good fetal movement.  She reports no uterine contractions, no bleeding and no loss of fluid per vagina.  Vitals:  Blood pressure (!) 157/77, pulse (!) 59, temperature 98.5 F (36.9 C), temperature source Oral, resp. rate 18, last menstrual period 03/30/2020, SpO2 99 %, currently breastfeeding.   Physical Examination: Lungs clear Heart RRR Abd soft, + BS gravid Ext non tender  Fetal Monitoring:  Baseline: 130-140's, accels, no ut ctx bpm  Labs:  Results for orders placed or performed during the hospital encounter of 11/10/20 (from the past 24 hour(s))  Glucose, capillary   Collection Time: 11/13/20  3:24 PM  Result Value Ref Range   Glucose-Capillary 82 70 - 99 mg/dL  Glucose, capillary   Collection Time: 11/13/20  8:28 PM  Result Value Ref Range   Glucose-Capillary 100 (H) 70 - 99 mg/dL  Glucose, capillary   Collection Time: 11/14/20  6:21 AM  Result Value Ref Range   Glucose-Capillary 84 70 - 99 mg/dL  Glucose, capillary   Collection Time: 11/14/20 10:26 AM  Result Value Ref Range   Glucose-Capillary 91 70 - 99 mg/dL    Imaging Studies:    BPP and dopplers today   Medications:  Scheduled . aspirin EC  81 mg Oral Daily  . famotidine  20 mg Oral Daily  . insulin aspart  0-14 Units Subcutaneous TID PC  . insulin aspart  0-14 Units Subcutaneous QAC breakfast  . levothyroxine  137 mcg Oral QAC breakfast  . NIFEdipine  30 mg Oral BID  . prenatal multivitamin  1 tablet Oral Q1200  . valACYclovir  500 mg Oral BID   I have reviewed the patient's current medications.  ASSESSMENT: IUP 32 5/7  weeks CHTN with severe SIPEC GDM IUGR Alloisomerization H/O c section and VBAC  PLAN: Stable. BPP and Dopplers today. BP stable. CBG's in goal range. Fetal well being reassuring.Conitnue with twice weekly antenatal testing.  In house until delivery at 34 weeks. Delivery mode TBD, pt desires VBAC. Declines BTL. Continue routine antenatal care.   Marisa Campbell 11/14/2020,11:15 AM

## 2020-11-15 ENCOUNTER — Ambulatory Visit: Payer: Medicaid Other

## 2020-11-15 DIAGNOSIS — O365930 Maternal care for other known or suspected poor fetal growth, third trimester, not applicable or unspecified: Secondary | ICD-10-CM | POA: Diagnosis not present

## 2020-11-15 DIAGNOSIS — O113 Pre-existing hypertension with pre-eclampsia, third trimester: Secondary | ICD-10-CM | POA: Diagnosis not present

## 2020-11-15 DIAGNOSIS — O24419 Gestational diabetes mellitus in pregnancy, unspecified control: Secondary | ICD-10-CM | POA: Diagnosis not present

## 2020-11-15 DIAGNOSIS — O10913 Unspecified pre-existing hypertension complicating pregnancy, third trimester: Secondary | ICD-10-CM | POA: Diagnosis not present

## 2020-11-15 DIAGNOSIS — O99820 Streptococcus B carrier state complicating pregnancy: Secondary | ICD-10-CM

## 2020-11-15 LAB — GLUCOSE, CAPILLARY
Glucose-Capillary: 93 mg/dL (ref 70–99)
Glucose-Capillary: 101 mg/dL — ABNORMAL HIGH (ref 70–99)
Glucose-Capillary: 102 mg/dL — ABNORMAL HIGH (ref 70–99)
Glucose-Capillary: 102 mg/dL — ABNORMAL HIGH (ref 70–99)

## 2020-11-15 NOTE — Progress Notes (Signed)
Patient ID: Marisa Campbell, female   DOB: 1981/04/29, 40 y.o.   MRN: [id number] [facility] ANTEPARTUM COMPREHENSIVE PROGRESS NOTE  Marisa Campbell is a 40 y.o. [id number] at 103w6d  who is admitted for [facility] with severe SIPEC.   Fetal presentation is cephalic. Length of Stay:  5  Days  Subjective: Pt is without complaints today. Denies HA or visual changes Patient reports good fetal movement.  She reports no uterine contractions, no bleeding and no loss of fluid per vagina.  Vitals:  Blood pressure 136/73, pulse (!) 56, temperature (!) 97.2 F (36.2 C), temperature source Oral, resp. rate 18, last menstrual period 03/30/2020, SpO2 97 %, currently breastfeeding. Physical Examination: Lungs clear Heart RRR Abd soft + BS gravid Ext non tender  Fetal Monitoring:  130-140's, + accels, no decels or ut ctx  Labs:  Results for orders placed or performed during the hospital encounter of 11/10/20 (from the past 24 hour(s))  Glucose, capillary   Collection Time: 11/14/20  2:24 PM  Result Value Ref Range   Glucose-Capillary 91 70 - 99 mg/dL  Glucose, capillary   Collection Time: 11/14/20  9:29 PM  Result Value Ref Range   Glucose-Capillary 101 (H) 70 - 99 mg/dL  Glucose, capillary   Collection Time: 11/15/20  5:36 AM  Result Value Ref Range   Glucose-Capillary 93 70 - 99 mg/dL  Glucose, capillary   Collection Time: 11/15/20 10:25 AM  Result Value Ref Range   Glucose-Capillary 101 (H) 70 - 99 mg/dL    Imaging Studies:    NA   Medications:  Scheduled . aspirin EC  81 mg Oral Daily  . famotidine  20 mg Oral Daily  . insulin aspart  0-14 Units Subcutaneous TID PC  . insulin aspart  0-14 Units Subcutaneous QAC breakfast  . levothyroxine  137 mcg Oral QAC breakfast  . NIFEdipine  30 mg Oral BID  . prenatal multivitamin  1 tablet Oral Q1200  . valACYclovir  500 mg Oral BID   I have reviewed the patient's current medications.  ASSESSMENT: IUP 32 6/7 weeks CHTN with severe  SIPEC GDM IUGR Alloisomerization H/O c section and VBAC GBS +   PLAN: Stable BP and CBG's. Fetal well being reassuring. Continue with twice weekly  Testing. Delivery at 34 weeks or for maternal/fetal indications. Delivery mode TBD, pt desires VBAC.  Continue routine antenatal care.   Hermina Staggers 11/15/2020,11:23 AM

## 2020-11-16 DIAGNOSIS — O113 Pre-existing hypertension with pre-eclampsia, third trimester: Secondary | ICD-10-CM | POA: Diagnosis not present

## 2020-11-16 DIAGNOSIS — O10913 Unspecified pre-existing hypertension complicating pregnancy, third trimester: Secondary | ICD-10-CM | POA: Diagnosis not present

## 2020-11-16 DIAGNOSIS — O24419 Gestational diabetes mellitus in pregnancy, unspecified control: Secondary | ICD-10-CM | POA: Diagnosis not present

## 2020-11-16 DIAGNOSIS — O365930 Maternal care for other known or suspected poor fetal growth, third trimester, not applicable or unspecified: Secondary | ICD-10-CM | POA: Diagnosis not present

## 2020-11-16 LAB — GLUCOSE, CAPILLARY
Glucose-Capillary: 102 mg/dL — ABNORMAL HIGH (ref 70–99)
Glucose-Capillary: 83 mg/dL (ref 70–99)
Glucose-Capillary: 107 mg/dL — ABNORMAL HIGH (ref 70–99)
Glucose-Capillary: 107 mg/dL — ABNORMAL HIGH (ref 70–99)
Glucose-Capillary: 100 mg/dL — ABNORMAL HIGH (ref 70–99)

## 2020-11-16 LAB — CBC
HCT: 37.5 % (ref 36.0–46.0)
Hemoglobin: 13.3 g/dL (ref 12.0–15.0)
MCH: 30.3 pg (ref 26.0–34.0)
MCHC: 35.5 g/dL (ref 30.0–36.0)
MCV: 85.4 fL (ref 80.0–100.0)
Platelets: 177 10*3/uL (ref 150–400)
RBC: 4.39 MIL/uL (ref 3.87–5.11)
RDW: 13.2 % (ref 11.5–15.5)
WBC: 10 10*3/uL (ref 4.0–10.5)
nRBC: 0 % (ref 0.0–0.2)

## 2020-11-16 LAB — COMPREHENSIVE METABOLIC PANEL
ALT: 33 U/L (ref 0–44)
AST: 20 U/L (ref 15–41)
Albumin: 2.5 g/dL — ABNORMAL LOW (ref 3.5–5.0)
Alkaline Phosphatase: 71 U/L (ref 38–126)
Anion gap: 4 — ABNORMAL LOW (ref 5–15)
BUN: 16 mg/dL (ref 6–20)
CO2: 23 mmol/L (ref 22–32)
Calcium: 8.9 mg/dL (ref 8.9–10.3)
Chloride: 106 mmol/L (ref 98–111)
Creatinine, Ser: 0.66 mg/dL (ref 0.44–1.00)
GFR, Estimated: >60 mL/min (ref >60)
Glucose, Bld: 93 mg/dL (ref 70–99)
Potassium: 4.2 mmol/L (ref 3.5–5.1)
Sodium: 133 mmol/L — ABNORMAL LOW (ref 135–145)
Total Bilirubin: 0.4 mg/dL (ref 0.3–1.2)
Total Protein: 5.8 g/dL — ABNORMAL LOW (ref 6.5–8.1)

## 2020-11-16 LAB — BPAM RBC
Blood Product Expiration Date: 202205282359
Blood Product Expiration Date: 202205282359
Unit Type and Rh: 5100
Unit Type and Rh: 5100

## 2020-11-16 LAB — TYPE AND SCREEN
ABO/RH(D): AB POS
Antibody Screen: POSITIVE
Donor AG Type: NEGATIVE
Donor AG Type: NEGATIVE
Unit division: 0
Unit division: 0

## 2020-11-16 MED ORDER — SODIUM CHLORIDE 0.9% FLUSH
3.0000 mL | Freq: Two times a day (BID) | INTRAVENOUS | Status: DC
  Administered 2020-11-16 – 2020-11-23 (×15): 3 mL via INTRAVENOUS

## 2020-11-16 MED ORDER — SODIUM CHLORIDE 0.9% FLUSH: 3.0000 mL | INTRAVENOUS | Status: DC | PRN

## 2020-11-16 NOTE — Progress Notes (Signed)
Patient ID: Marisa Campbell, female   DOB: [birth year], 40 y.o.   MRN: [id number] ACULTY PRACTICE ANTEPARTUM COMPREHENSIVE PROGRESS NOTE  Marisa Campbell is a 40 y.o. [id number] at [redacted]w[redacted]d  who is admitted for [facility] with severe SIPEC.   Fetal presentation is cephalic. Length of Stay:  6  Days  Subjective: No new complaints this morning. Denies HA or visual changes. Patient reports good fetal movement.  She reports no uterine contractions, no bleeding and no loss of fluid per vagina.  Vitals:  Blood pressure (!) 151/91, pulse (!) 56, temperature 98 F (36.7 C), temperature source Oral, resp. rate 16, last menstrual period 03/30/2020, SpO2 98 %, currently breastfeeding. Physical Examination: Lungs clear Heart RRR Abd soft, + BS, gravid Ext non tender  Fetal Monitoring:  140's, + acels, no decles, no ut ctx  Labs:  Results for orders placed or performed during the hospital encounter of 11/10/20 (from the past 24 hour(s))  Glucose, capillary   Collection Time: 11/15/20 10:25 AM  Result Value Ref Range   Glucose-Capillary 101 (H) 70 - 99 mg/dL  Glucose, capillary   Collection Time: 11/15/20  4:53 PM  Result Value Ref Range   Glucose-Capillary 102 (H) 70 - 99 mg/dL  Glucose, capillary   Collection Time: 11/15/20 10:13 PM  Result Value Ref Range   Glucose-Capillary 102 (H) 70 - 99 mg/dL  Glucose, capillary   Collection Time: 11/16/20  5:10 AM  Result Value Ref Range   Glucose-Capillary 102 (H) 70 - 99 mg/dL  Comprehensive metabolic panel   Collection Time: 11/16/20  7:30 AM  Result Value Ref Range   Sodium 133 (L) 135 - 145 mmol/L   Potassium 4.2 3.5 - 5.1 mmol/L   Chloride 106 98 - 111 mmol/L   CO2 23 22 - 32 mmol/L   Glucose, Bld 93 70 - 99 mg/dL   BUN 16 6 - 20 mg/dL   Creatinine, Ser 2.53 0.44 - 1.00 mg/dL   Calcium 8.9 8.9 - 66.4 mg/dL   Total Protein 5.8 (L) 6.5 - 8.1 g/dL   Albumin 2.5 (L) 3.5 - 5.0 g/dL   AST 20 15 - 41 U/L   ALT 33 0 - 44 U/L   Alkaline  Phosphatase 71 38 - 126 U/L   Total Bilirubin 0.4 0.3 - 1.2 mg/dL   GFR, Estimated >40 >34 mL/min   Anion gap 4 (L) 5 - 15  CBC   Collection Time: 11/16/20  7:30 AM  Result Value Ref Range   WBC 10.0 4.0 - 10.5 K/uL   RBC 4.39 3.87 - 5.11 MIL/uL   Hemoglobin 13.3 12.0 - 15.0 g/dL   HCT 74.2 59.5 - 63.8 %   MCV 85.4 80.0 - 100.0 fL   MCH 30.3 26.0 - 34.0 pg   MCHC 35.5 30.0 - 36.0 g/dL   RDW 75.6 43.3 - 29.5 %   Platelets 177 150 - 400 K/uL   nRBC 0.0 0.0 - 0.2 %  Type and screen   Collection Time: 11/16/20  7:30 AM  Result Value Ref Range   ABO/RH(D) AB POS    Antibody Screen POS    Sample Expiration      11/19/2020,2359 Performed at [facility], [address] [place], [place] [address]   Glucose, capillary   Collection Time: 11/16/20  8:33 AM  Result Value Ref Range   Glucose-Capillary 83 70 - 99 mg/dL    Imaging Studies:    NA   Medications:  Scheduled . aspirin EC  81 mg Oral Daily  . famotidine  20 mg Oral Daily  . insulin aspart  0-14 Units Subcutaneous TID PC  . insulin aspart  0-14 Units Subcutaneous QAC breakfast  . levothyroxine  137 mcg Oral QAC breakfast  . NIFEdipine  30 mg Oral BID  . prenatal multivitamin  1 tablet Oral Q1200  . valACYclovir  500 mg Oral BID   I have reviewed the patient's current medications.  ASSESSMENT: IUP 33 weeks CHTN with severe SIPEC GDM IUGR Alloisomerization H/O C section and VBAC GBS +    PLAN: BP slightly elevated this morning. HELLP labs normal. Will continue to monitor. CBG's stable. Fetal well being reassuring. Continue with antenatal testing and BPP with Dopplers as per MFM. Delivery at 34 weeks or for maternal/fetal indications. Pt desires VBAC, delivery mode TBD. Pt reports she has not had HSV. Had "cold sores" in 2019 and PCP prescribed her Valtrex.  Continue routine antenatal care.   Hermina Staggers 11/16/2020,9:21 AM

## 2020-11-17 ENCOUNTER — Inpatient Hospital Stay (HOSPITAL_BASED_OUTPATIENT_CLINIC_OR_DEPARTMENT_OTHER): Payer: Medicaid Other

## 2020-11-17 ENCOUNTER — Encounter (HOSPITAL_COMMUNITY): Payer: Self-pay | Admitting: Obstetrics & Gynecology

## 2020-11-17 DIAGNOSIS — O365930 Maternal care for other known or suspected poor fetal growth, third trimester, not applicable or unspecified: Secondary | ICD-10-CM | POA: Diagnosis not present

## 2020-11-17 DIAGNOSIS — O09213 Supervision of pregnancy with history of pre-term labor, third trimester: Secondary | ICD-10-CM

## 2020-11-17 DIAGNOSIS — O361930 Maternal care for other isoimmunization, third trimester, not applicable or unspecified: Secondary | ICD-10-CM

## 2020-11-17 DIAGNOSIS — O113 Pre-existing hypertension with pre-eclampsia, third trimester: Secondary | ICD-10-CM | POA: Diagnosis not present

## 2020-11-17 DIAGNOSIS — O99283 Endocrine, nutritional and metabolic diseases complicating pregnancy, third trimester: Secondary | ICD-10-CM

## 2020-11-17 DIAGNOSIS — Z8619 Personal history of other infectious and parasitic diseases: Secondary | ICD-10-CM

## 2020-11-17 DIAGNOSIS — Z3A33 33 weeks gestation of pregnancy: Secondary | ICD-10-CM

## 2020-11-17 DIAGNOSIS — E039 Hypothyroidism, unspecified: Secondary | ICD-10-CM

## 2020-11-17 DIAGNOSIS — O99820 Streptococcus B carrier state complicating pregnancy: Secondary | ICD-10-CM

## 2020-11-17 DIAGNOSIS — O360930 Maternal care for other rhesus isoimmunization, third trimester, not applicable or unspecified: Secondary | ICD-10-CM

## 2020-11-17 DIAGNOSIS — O10013 Pre-existing essential hypertension complicating pregnancy, third trimester: Secondary | ICD-10-CM | POA: Diagnosis not present

## 2020-11-17 DIAGNOSIS — O34219 Maternal care for unspecified type scar from previous cesarean delivery: Secondary | ICD-10-CM | POA: Diagnosis not present

## 2020-11-17 HISTORY — DX: Personal history of other infectious and parasitic diseases: Z86.19

## 2020-11-17 LAB — GLUCOSE, CAPILLARY
Glucose-Capillary: 84 mg/dL (ref 70–99)
Glucose-Capillary: 78 mg/dL (ref 70–99)
Glucose-Capillary: 87 mg/dL (ref 70–99)
Glucose-Capillary: 119 mg/dL — ABNORMAL HIGH (ref 70–99)
Glucose-Capillary: 135 mg/dL — ABNORMAL HIGH (ref 70–99)

## 2020-11-17 MED ORDER — DOCUSATE SODIUM 100 MG PO CAPS
100.0000 mg | ORAL_CAPSULE | Freq: Every day | ORAL | Status: DC
  Administered 2020-11-17 – 2020-11-23 (×7): 100 mg via ORAL
  Filled 2020-11-17 (×7): qty 1

## 2020-11-17 NOTE — Progress Notes (Signed)
Daily Antepartum Note  Admission Date: 11/10/2020 Current Date: 11/17/2020 7:31 AM  Marisa Campbell is a 40 y.o. [id number] @ [redacted]w[redacted]d, HD#8, admitted for severe pre-eclampsia superimposed on cHTN (severe range BPs).  Pregnancy complicated by: Patient Active Problem List   Diagnosis Date Noted  . Group B streptococcal infection during pregnancy 11/14/2020  . Bradycardia with 51-60 beats per minute 11/13/2020  . IUGR (intrauterine growth retardation) affecting mother, third trimester 11/12/2020  . [redacted] weeks gestation of pregnancy 11/10/2020  . Severe preeclampsia, third trimester 11/10/2020  . Grand multiparity with current pregnancy 10/27/2020  . Carpal tunnel syndrome of right wrist 10/19/2020  . Gestational diabetes 10/13/2020  . Maternal morbid obesity, antepartum (HCC) 10/12/2020  . History of 2 cesarean sections 09/14/2020  . Advanced maternal age in multigravida 09/07/2020  . Chronic hypertension with superimposed severe preeclampsia 08/17/2020  . Supervision of high-risk pregnancy 08/16/2020  . Hypothyroidism 08/13/2019  . 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 CIN I (cervical intraepithelial neoplasia I) 05/24/2016  . Low grade squamous intraepithelial lesion on cytologic smear of cervix (LGSIL) 05/17/2016    Overnight/24hr events:  none  Subjective:  No s/s of pre-eclampsia  Objective:    Current Vital Signs 24h Vital Sign Ranges  T 98 F (36.7 C) Temp  Avg: 98.4 F (36.9 C)  Min: 98 F (36.7 C)  Max: 98.7 F (37.1 C)  BP 123/65 BP  Min: 123/65  Max: 160/83  HR (!) 53 Pulse  Avg: 58  Min: 53  Max: 65  RR 16 Resp  Avg: 17.5  Min: 16  Max: 18  SaO2 99 % Room Air SpO2  Avg: 98.4 %  Min: 97 %  Max: 99 %       24 Hour I/O Current Shift I/O  Time Ins Outs No intake/output data recorded. No intake/output data recorded.   Patient Vitals for the past 24 hrs:  BP Temp Temp src Pulse Resp SpO2  11/17/20 0354 123/65 98 F (36.7 C) Oral (!) 53 16 99 %  11/16/20  2247 (!) 150/79 98.3 F (36.8 C) Oral 65 -- --  11/16/20 1958 (!) 149/80 98.3 F (36.8 C) Oral (!) 59 18 99 %  11/16/20 1531 (!) 144/72 98.6 F (37 C) Oral (!) 59 18 99 %  11/16/20 1530 -- -- -- -- -- 97 %  11/16/20 1106 140/67 98.7 F (37.1 C) Oral (!) 56 18 99 %  11/16/20 1105 -- -- -- -- -- 97 %  11/16/20 1047 (!) 146/65 -- -- 60 -- --  11/16/20 0812 (!) 151/91 -- -- (!) 56 -- --  11/16/20 0801 (!) 160/83 -- -- (!) 56 -- --  11/16/20 0800 -- -- -- -- -- 99 %   FHT: 150 baseline, +accels, ?one slight variable decel, mod variability Tocometry: no contractions x 41m  Physical exam: General: Well nourished, well developed female in no acute distress. Abdomen: obese, nttp Cardiovascular: S1, S2 normal, no murmur, rub or gallop, regular rate and rhythm Respiratory: CTAB Extremities: no clubbing, cyanosis or edema Skin: Warm and dry.   Medications: Current Facility-Administered Medications  Medication Dose Route Frequency Provider Last Rate Last Admin  . acetaminophen (TYLENOL) tablet 650 mg  650 mg Oral Q4H PRN Anyanwu, Ugonna A, MD   650 mg at 11/16/20 1050  . aspirin EC tablet 81 mg  81 mg Oral Daily Anyanwu, Ugonna A, MD   81 mg at 11/16/20 1018  . butalbital-acetaminophen-caffeine (FIORICET) 50-325-40 MG per tablet 1  tablet  1 tablet Oral Q6H PRN Hermina Staggers, MD   1 tablet at 11/14/20 1315  . calcium carbonate (TUMS - dosed in mg elemental calcium) chewable tablet 400 mg of elemental calcium  2 tablet Oral Q4H PRN Anyanwu, Ugonna A, MD   400 mg of elemental calcium at 11/16/20 2002  . docusate sodium (COLACE) capsule 100 mg  100 mg Oral BID PRN [place] Bing, MD   100 mg at 11/14/20 0948  . famotidine (PEPCID) tablet 20 mg  20 mg Oral Daily Anyanwu, Ugonna A, MD   20 mg at 11/16/20 1018  . labetalol (NORMODYNE) injection 20 mg  20 mg Intravenous PRN Anyanwu, Ugonna A, MD   20 mg at 11/10/20 1114   And  . labetalol (NORMODYNE) injection 40 mg  40 mg Intravenous PRN  Anyanwu, Jethro Bastos, MD   40 mg at 11/10/20 1214   And  . labetalol (NORMODYNE) injection 80 mg  80 mg Intravenous PRN Anyanwu, Ugonna A, MD       And  . hydrALAZINE (APRESOLINE) injection 10 mg  10 mg Intravenous PRN Anyanwu, Ugonna A, MD      . insulin aspart (novoLOG) injection 0-14 Units  0-14 Units Subcutaneous TID PC Anyanwu, Ugonna A, MD      . insulin aspart (novoLOG) injection 0-14 Units  0-14 Units Subcutaneous QAC breakfast Anyanwu, Ugonna A, MD      . levothyroxine (SYNTHROID) tablet 137 mcg  137 mcg Oral QAC breakfast Anyanwu, Jethro Bastos, MD   137 mcg at 11/17/20 0636  . NIFEdipine (PROCARDIA-XL/NIFEDICAL-XL) 24 hr tablet 30 mg  30 mg Oral BID Anyanwu, Ugonna A, MD   30 mg at 11/16/20 2002  . prenatal multivitamin tablet 1 tablet  1 tablet Oral Q1200 Anyanwu, Jethro Bastos, MD   1 tablet at 11/16/20 1357  . sodium chloride flush (NS) 0.9 % injection 3 mL  3 mL Intravenous Q12H Hermina Staggers, MD   3 mL at 11/16/20 2207  . sodium chloride flush (NS) 0.9 % injection 3 mL  3 mL Intravenous PRN Hermina Staggers, MD        Labs:  AB POS +big E and little c  Recent Labs  Lab 11/10/20 1041 11/13/20 0736 11/16/20 0730  WBC 8.0 9.5 10.0  HGB 11.6* 11.7* 13.3  HCT 33.5* 34.4* 37.5  PLT 188 186 177    Recent Labs  Lab 11/10/20 1041 11/13/20 0736 11/16/20 0730  NA 134* 135 133*  K 4.0 4.2 4.2  CL 106 105 106  CO2 24 24 23   BUN 11 15 16   CREATININE 0.67 0.68 0.66  CALCIUM 8.7* 8.9 8.9  PROT 5.8* 5.7* 5.8*  BILITOT 0.4 0.3 0.4  ALKPHOS 72 58 71  ALT 34 41 33  AST 32 29 20  GLUCOSE 107* 88 93   Results for [facility], Coda A (MRN ) as of 11/17/2020 07:32  Ref. Range 11/16/2020 08:33 11/16/2020 10:43 11/16/2020 14:58 11/16/2020 19:53 11/17/2020 05:24  Glucose-Capillary Latest Ref Range: 70 - 99 mg/dL 83 01/16/2021 (H) 11/19/2020 (H) 409 (H) 84    Radiology:  No new imaging 5/10: cephalic, afi 10, bpp 8/8, nl UA and MCA dopplers 5/3: breech, afi 7.53, 1521gm, 6%, ac 14%, normal UA  and MCA dopplers  Assessment & Plan:  Pt doing well *Pregnancy: reactive NST *CV: doing well on current regimen.  -s/p Mg on admit *Preterm: s/p bmz on 5/6 and 5/7. Time rescue dose with delivery. S/p nicu consult *  GDM: no issue on no meds *Alloisommunization: f/u u/s today. Pt on Tuesday/friday schedule. Have blood available for delivery *FGR: see above.  *H/o c-section and VBAC: I d/w her re: our usual policy is not to induce patient's with c/s x 2 but she is a different situation and chances of spontaneous labor for her delivery are low. I d/w her re: increased uterine rupture risk with c/s x 2 and IOL and an approximately 1% rupture risk which would be dangerous for both her and baby. She desires TOLAC. I told her I'd have to talk to our group about this. *BMI 50s: no issues *h/o cold sores: pt denies any GU s/s ever and wants valtrex d/c'ed which is fine.   *AMA: no issues *GBS positive: tx in labor *PPx: SCDs *FEN/GI: GDM *Dispo: plan for 34wk delivery.   Cornelia Copa MD Attending Center for [facility] GYN Consult Phone: [phone] (M-F, 0800-1700) & [phone] (Off hours, weekends, holidays)

## 2020-11-17 NOTE — Progress Notes (Signed)
Initial Nutrition Assessment  DOCUMENTATION CODES:   Morbid obesity  INTERVENTION:  CHO modified gestational diabetic diet May order double protein portions  NUTRITION DIAGNOSIS:  Altered nutrition lab value related to  (GDM diagnosis) as evidenced by  (abn GTT, glucose levels wnl currently).  GOAL:  Patient will meet greater than or equal to 90% of their needs MONITOR:  Labs  REASON FOR ASSESSMENT:  Antenatal,LOS   ASSESSMENT:  33 1/7 weeks IUP,admitted for severe pre-eclampsia superimposed on cHTN. Pre-preg weight 129.3 kg, BMI 44. 13.9 kg/ 31 lb weight gain. GDM/ no meds Delivery planned at 34 weeks  Diet Order:   Diet Order            Diet Carb Modified Fluid consistency: Thin; Room service appropriate? Yes  Diet effective now                EDUCATION NEEDS:   Education needs have been addressed (10/25/20)  Skin:  Skin Assessment: Reviewed RN Assessment  Height:   Ht Readings from Last 1 Encounters:  11/07/20 5' 2.5" (1.588 m)   Weight:   Wt Readings from Last 1 Encounters:  11/10/20 (!) 143.2 kg    Ideal Body Weight:   115 lbs  BMI:  There is no height or weight on file to calculate BMI.  Estimated Nutritional Needs:   Kcal:  2300-2500  Protein:  100-110  Fluid:  2.6 L

## 2020-11-18 DIAGNOSIS — Z3A33 33 weeks gestation of pregnancy: Secondary | ICD-10-CM

## 2020-11-18 DIAGNOSIS — O1413 Severe pre-eclampsia, third trimester: Secondary | ICD-10-CM | POA: Diagnosis not present

## 2020-11-18 LAB — GLUCOSE, CAPILLARY
Glucose-Capillary: 83 mg/dL (ref 70–99)
Glucose-Capillary: 101 mg/dL — ABNORMAL HIGH (ref 70–99)
Glucose-Capillary: 120 mg/dL — ABNORMAL HIGH (ref 70–99)
Glucose-Capillary: 111 mg/dL — ABNORMAL HIGH (ref 70–99)

## 2020-11-18 NOTE — Progress Notes (Addendum)
FACULTY PRACTICE ANTEPARTUM(COMPREHENSIVE) NOTE  Marisa Campbell is a 40 y.o. [id number] with Estimated Date of Delivery: 01/04/21   By  best clinical estimate [redacted]w[redacted]d  who is admitted for preeclampsia with severe features.    Fetal presentation is cephalic. Length of Stay:  8  Days  Date of admission:11/10/2020  Subjective: Her only concern is considerable pelvic/vaginal pain.  Notes intense sharp pain when she moves especially when she stands. Patient reports the fetal movement as active. Patient reports uterine contraction  activity as none. Patient reports  vaginal bleeding as none. Patient describes fluid per vagina as None.  Vitals:  Blood pressure (!) 146/76, pulse (!) 56, temperature 98.9 F (37.2 C), temperature source Oral, resp. rate 17, last menstrual period 03/30/2020, SpO2 99 %, currently breastfeeding. Vitals:   11/17/20 2154 11/17/20 2155 11/17/20 2322 11/18/20 0640  BP: 136/65  (!) 159/82 (!) 146/76  Pulse: 66  62 (!) 56  Resp: 18  18 17   Temp: 98.4 F (36.9 C)  97.7 F (36.5 C) 98.9 F (37.2 C)  TempSrc: Oral  Oral Oral  SpO2: 98% 98% 99% 99%   Physical Examination:  General appearance - alert, well appearing, and in no distress Mental status - alert, oriented to person, place, and time Chest - clear to auscultation, no wheezes, rales or rhonchi, symmetric air entry Heart - normal rate and regular rhythm Abdomen - obese, gravid, non-tender Musculoskeletal - no calf tenderness bilaterally Extremities - minimal edema Skin - warm and dry   Fetal Monitoring:  Baseline: 140 bpm, Variability: moderate, Accelerations: +15x15 accels and Decelerations: Absent   reactive  Labs:  Results for orders placed or performed during the hospital encounter of 11/10/20 (from the past 24 hour(s))  Glucose, capillary   Collection Time: 11/17/20  9:20 AM  Result Value Ref Range   Glucose-Capillary 78 70 - 99 mg/dL  Glucose, capillary   Collection Time: 11/17/20 11:22 AM  Result  Value Ref Range   Glucose-Capillary 87 70 - 99 mg/dL  Glucose, capillary   Collection Time: 11/17/20  3:32 PM  Result Value Ref Range   Glucose-Capillary 119 (H) 70 - 99 mg/dL  Glucose, capillary   Collection Time: 11/17/20 10:01 PM  Result Value Ref Range   Glucose-Capillary 135 (H) 70 - 99 mg/dL    Imaging Studies:   BPP/doppler completed on 5/13  Medications:  Scheduled . aspirin EC  81 mg Oral Daily  . docusate sodium  100 mg Oral Daily  . famotidine  20 mg Oral Daily  . insulin aspart  0-14 Units Subcutaneous TID PC  . insulin aspart  0-14 Units Subcutaneous QAC breakfast  . levothyroxine  137 mcg Oral QAC breakfast  . NIFEdipine  30 mg Oral BID  . prenatal multivitamin  1 tablet Oral Q1200  . sodium chloride flush  3 mL Intravenous Q12H   I have reviewed the patient's current medications.  ASSESSMENT/PLAN: 6/13 106w2d Estimated Date of Delivery: 01/04/21 admitted for preeclampsia with severe features 1) Severe preeclampsia -BP stable on Procardia 30mg  bid -s/p Mag x 24hr -currently asymptomatic, labs stable q 72hr  2)GDMA1- no scheduled medication sliding scale as needed  3) Fetal well being -s/p BMZ 5/6-5/7, considering rescue dose steroids however it will not yet be 14days from prior dose -continue BPP/dopplers twice weekly (Tues/Fri) -continue NST q shift  4) Alloisoimmunization -anti-C and anti-E  5) h/o prior C-section and VBAC- pt previously counseled regarding risk/benefit including but not limited to risk of uterine rupture  with potential complications including fetal demise.  Pt would strongly prefer TOLAC.  Reviewed care plan and expectations at 34wks  6) Maternal well being -BMI 50s- ambulation as tolerated, SCDs while in bed -GBS positive: treatment in labor -pelvic pain- concern for pubic diastasis- tylenol prn and abdominal binder  DISPO: Continue with plan as outlined above, induction/delivery to be @ 34wks or as clinically  indicated   Sharon Seller 11/18/2020,6:52 AM

## 2020-11-18 NOTE — Progress Notes (Signed)
Patients BP 175/107.  Patients children in room and patient attending to them.

## 2020-11-18 NOTE — Progress Notes (Signed)
Patient crying due to children leaving and going home.  Refused BP recheck and requested we let her calm down and then recheck.

## 2020-11-18 NOTE — Progress Notes (Signed)
Patient called out for BP to be rechecked.  Recheck 134/67.

## 2020-11-19 DIAGNOSIS — O24410 Gestational diabetes mellitus in pregnancy, diet controlled: Secondary | ICD-10-CM | POA: Diagnosis not present

## 2020-11-19 DIAGNOSIS — O99820 Streptococcus B carrier state complicating pregnancy: Secondary | ICD-10-CM | POA: Diagnosis not present

## 2020-11-19 DIAGNOSIS — O1413 Severe pre-eclampsia, third trimester: Secondary | ICD-10-CM | POA: Diagnosis not present

## 2020-11-19 DIAGNOSIS — O09523 Supervision of elderly multigravida, third trimester: Secondary | ICD-10-CM | POA: Diagnosis not present

## 2020-11-19 LAB — COMPREHENSIVE METABOLIC PANEL
ALT: 27 U/L (ref 0–44)
AST: 20 U/L (ref 15–41)
Albumin: 2.5 g/dL — ABNORMAL LOW (ref 3.5–5.0)
Alkaline Phosphatase: 86 U/L (ref 38–126)
Anion gap: 8 (ref 5–15)
BUN: 19 mg/dL (ref 6–20)
CO2: 19 mmol/L — ABNORMAL LOW (ref 22–32)
Calcium: 9 mg/dL (ref 8.9–10.3)
Chloride: 105 mmol/L (ref 98–111)
Creatinine, Ser: 0.6 mg/dL (ref 0.44–1.00)
GFR, Estimated: >60 mL/min (ref >60)
Glucose, Bld: 95 mg/dL (ref 70–99)
Potassium: 4 mmol/L (ref 3.5–5.1)
Sodium: 132 mmol/L — ABNORMAL LOW (ref 135–145)
Total Bilirubin: 0.5 mg/dL (ref 0.3–1.2)
Total Protein: 5.7 g/dL — ABNORMAL LOW (ref 6.5–8.1)

## 2020-11-19 LAB — BPAM RBC
Blood Product Expiration Date: 202205282359
Blood Product Expiration Date: 202205282359
Unit Type and Rh: 5100
Unit Type and Rh: 5100

## 2020-11-19 LAB — TYPE AND SCREEN
ABO/RH(D): AB POS
Antibody Screen: POSITIVE
Unit division: 0
Unit division: 0

## 2020-11-19 LAB — CBC
HCT: 35.4 % — ABNORMAL LOW (ref 36.0–46.0)
Hemoglobin: 12.5 g/dL (ref 12.0–15.0)
MCH: 30.4 pg (ref 26.0–34.0)
MCHC: 35.3 g/dL (ref 30.0–36.0)
MCV: 86.1 fL (ref 80.0–100.0)
Platelets: 144 10*3/uL — ABNORMAL LOW (ref 150–400)
RBC: 4.11 MIL/uL (ref 3.87–5.11)
RDW: 13.3 % (ref 11.5–15.5)
WBC: 9.5 10*3/uL (ref 4.0–10.5)
nRBC: 0 % (ref 0.0–0.2)

## 2020-11-19 LAB — GLUCOSE, CAPILLARY
Glucose-Capillary: 83 mg/dL (ref 70–99)
Glucose-Capillary: 98 mg/dL (ref 70–99)
Glucose-Capillary: 104 mg/dL — ABNORMAL HIGH (ref 70–99)
Glucose-Capillary: 104 mg/dL — ABNORMAL HIGH (ref 70–99)

## 2020-11-19 NOTE — Progress Notes (Signed)
Patient ID: Marisa Campbell, female   DOB: [birth year], 40 y.o.   MRN: [id number] FACULTY PRACTICE ANTEPARTUM(COMPREHENSIVE) NOTE  Marisa Campbell is a 40 y.o. [id number] at [redacted]w[redacted]d  who is admitted for preeclampsia with severe features.    Fetal presentation is cephalic. Length of Stay:  9  Days  Date of admission:11/10/2020  Subjective: Patient is without complaints this morning. She reports occasional braxton Hicks contractions. She denies HA, visual changes, RUQ/epigastric pain, nausea or emesis Patient reports the fetal movement as active. Patient reports uterine contraction  activity as none. Patient reports  vaginal bleeding as none. Patient describes fluid per vagina as None.  Vitals:  Blood pressure 132/68, pulse (!) 57, temperature 97.9 F (36.6 C), temperature source Oral, resp. rate 18, last menstrual period 03/30/2020, SpO2 100 %, currently breastfeeding. Vitals:   11/18/20 2012 11/19/20 0002 11/19/20 0557 11/19/20 0756  BP: (!) 153/85 (!) 145/67 (!) 153/82 132/68  Pulse: (!) 59 (!) 53 (!) 56 (!) 57  Resp: 19 18 18 18   Temp: 97.8 F (36.6 C) 98.3 F (36.8 C) 98 F (36.7 C) 97.9 F (36.6 C)  TempSrc: Oral Oral Oral Oral  SpO2: 99% 99% 99% 100%   Physical Examination:  General appearance - alert, well appearing, and in no distress Chest - clear to auscultation, no wheezes, rales or rhonchi, symmetric air entry Heart - normal rate and regular rhythm Abdomen - obese, gravid, non-tender Musculoskeletal - no calf tenderness bilaterally Extremities - minimal edema Skin - warm and dry   Fetal Monitoring:  Baseline: 140 bpm, Variability: moderate, Accelerations: +15x15 accels and Decelerations: Absent   reactive  Labs:  Results for orders placed or performed during the hospital encounter of 11/10/20 (from the past 24 hour(s))  Glucose, capillary   Collection Time: 11/18/20  9:17 AM  Result Value Ref Range   Glucose-Capillary 83 70 - 99 mg/dL  Glucose, capillary    Collection Time: 11/18/20 11:32 AM  Result Value Ref Range   Glucose-Capillary 101 (H) 70 - 99 mg/dL  Glucose, capillary   Collection Time: 11/18/20  3:37 PM  Result Value Ref Range   Glucose-Capillary 120 (H) 70 - 99 mg/dL  Glucose, capillary   Collection Time: 11/18/20  9:25 PM  Result Value Ref Range   Glucose-Capillary 111 (H) 70 - 99 mg/dL  Glucose, capillary   Collection Time: 11/19/20  8:45 AM  Result Value Ref Range   Glucose-Capillary 83 70 - 99 mg/dL    Imaging Studies:   BPP/doppler completed on 5/13  Medications:  Scheduled . aspirin EC  81 mg Oral Daily  . docusate sodium  100 mg Oral Daily  . famotidine  20 mg Oral Daily  . insulin aspart  0-14 Units Subcutaneous TID PC  . insulin aspart  0-14 Units Subcutaneous QAC breakfast  . levothyroxine  137 mcg Oral QAC breakfast  . NIFEdipine  30 mg Oral BID  . prenatal multivitamin  1 tablet Oral Q1200  . sodium chloride flush  3 mL Intravenous Q12H   I have reviewed the patient's current medications.  ASSESSMENT/PLAN: 6/13 [redacted]w[redacted]d Estimated Date of Delivery: 41/30/22 admitted for preeclampsia with severe features 1) Severe preeclampsia -BP stable on Procardia 30mg  bid -s/p Mag x 24hr -currently asymptomatic, labs stable q 72hr  2)GDMA1- no scheduled medication CBG well controlled sliding scale as needed  3) Fetal well being -s/p BMZ 5/6-5/7 -continue BPP/dopplers twice weekly (Tues/Fri) -continue NST q shift  4) Alloisoimmunization -anti-C and anti-E  5) h/o  prior C-section and VBAC - Patient desires TOLAC.  Reviewed care plan and expectations at 34wks  6) Maternal well being -BMI 50s- ambulation as tolerated, SCDs while in bed -GBS positive: treatment in labor -pelvic pain- concern for pubic diastasis- tylenol prn and abdominal binder  Continue routine prenatal care   Avanish Cerullo 11/19/2020,8:59 AM

## 2020-11-20 DIAGNOSIS — Z3A33 33 weeks gestation of pregnancy: Secondary | ICD-10-CM | POA: Diagnosis not present

## 2020-11-20 DIAGNOSIS — O1413 Severe pre-eclampsia, third trimester: Secondary | ICD-10-CM | POA: Diagnosis not present

## 2020-11-20 LAB — GLUCOSE, CAPILLARY
Glucose-Capillary: 88 mg/dL (ref 70–99)
Glucose-Capillary: 107 mg/dL — ABNORMAL HIGH (ref 70–99)
Glucose-Capillary: 125 mg/dL — ABNORMAL HIGH (ref 70–99)
Glucose-Capillary: 126 mg/dL — ABNORMAL HIGH (ref 70–99)

## 2020-11-20 NOTE — Progress Notes (Signed)
FACULTY PRACTICE ANTEPARTUM(COMPREHENSIVE) NOTE  Marisa Campbell is a 40 y.o. [id number] with Estimated Date of Delivery: 01/04/21   By  best clinical estimate [redacted]w[redacted]d  who is admitted for preeclampsia with severe features.    Fetal presentation is cephalic. Length of Stay:  10  Days  Date of admission:11/10/2020  Subjective: Resting comfortably in bed, no acute complaints Patient reports the fetal movement as active. Patient reports uterine contraction  activity as occasional irregular. Patient reports  vaginal bleeding as none. Patient describes fluid per vagina as None. Denies headache, blurry vision, no RUQ pain.  Vitals:  Blood pressure 139/62, pulse (!) 57, temperature 98.4 F (36.9 C), temperature source Oral, resp. rate 18, last menstrual period 03/30/2020, SpO2 100 %, currently breastfeeding. Vitals:   11/19/20 1940 11/19/20 2313 11/20/20 0639 11/20/20 0743  BP:  (!) 119/50 131/74 139/62  Pulse:  68 (!) 57 (!) 57  Resp:  Temp:  98.1 F (36.7 C) 97.8 F (36.6 C) 98.4 F (36.9 C)  TempSrc:  Oral Oral Oral  SpO2: 97% 99% 99% 100%   Physical Examination:  General appearance - alert, well appearing, and in no distress Mental status - normal mood, behavior, speech, dress, motor activity, and thought processes Chest - clear to auscultation, no wheezes, rales or rhonchi, symmetric air entry Heart - normal rate and regular rhythm Abdomen - gravid, obese, non-tender Extremities - no pedal edema noted, no calf tenderness Skin - warm and dry  Fetal Monitoring:  Baseline: 135 bpm, Variability: moderate, Accelerations: +accels and Decelerations: Absent   reactive  Labs:  Results for orders placed or performed during the hospital encounter of 11/10/20 (from the past 24 hour(s))  Glucose, capillary   Collection Time: 11/19/20 10:42 AM  Result Value Ref Range   Glucose-Capillary 98 70 - 99 mg/dL  Comprehensive metabolic panel   Collection Time: 11/19/20  1:14 PM  Result  Value Ref Range   Sodium 132 (L) 135 - 145 mmol/L   Potassium 4.0 3.5 - 5.1 mmol/L   Chloride 105 98 - 111 mmol/L   CO2 19 (L) 22 - 32 mmol/L   Glucose, Bld 95 70 - 99 mg/dL   BUN 19 6 - 20 mg/dL   Creatinine, Ser 9.81 0.44 - 1.00 mg/dL   Calcium 9.0 8.9 - 19.1 mg/dL   Total Protein 5.7 (L) 6.5 - 8.1 g/dL   Albumin 2.5 (L) 3.5 - 5.0 g/dL   AST 20 15 - 41 U/L   ALT 27 0 - 44 U/L   Alkaline Phosphatase 86 38 - 126 U/L   Total Bilirubin 0.5 0.3 - 1.2 mg/dL   GFR, Estimated >47 >82 mL/min   Anion gap 8 5 - 15  CBC   Collection Time: 11/19/20  1:14 PM  Result Value Ref Range   WBC 9.5 4.0 - 10.5 K/uL   RBC 4.11 3.87 - 5.11 MIL/uL   Hemoglobin 12.5 12.0 - 15.0 g/dL   HCT 95.6 (L) 21.3 - 08.6 %   MCV 86.1 80.0 - 100.0 fL   MCH 30.4 26.0 - 34.0 pg   MCHC 35.3 30.0 - 36.0 g/dL   RDW 57.8 46.9 - 62.9 %   Platelets 144 (L) 150 - 400 K/uL   nRBC 0.0 0.0 - 0.2 %  Type and screen   Collection Time: 11/19/20  1:14 PM  Result Value Ref Range   ABO/RH(D) AB POS    Antibody Screen POS    Sample Expiration 11/22/2020,2359  Antibody Identification ANTI E ANTI c    Unit Number [id number]    Blood Component Type RBC, LR IRR    Unit division 00    Status of Unit ALLOCATED    Transfusion Status OK TO TRANSFUSE    Crossmatch Result COMPATIBLE    Unit Number [id number]    Blood Component Type RBC, LR IRR    Unit division 00    Status of Unit ALLOCATED    Transfusion Status OK TO TRANSFUSE    Crossmatch Result COMPATIBLE   BPAM RBC   Collection Time: 11/19/20  1:14 PM  Result Value Ref Range   Blood Product Unit Number [id number]    PRODUCT CODE [id number]    Unit Type and Rh 5100    Blood Product Expiration Date 034742595638    Blood Product Unit Number [id number]    PRODUCT CODE [id number]    Unit Type and Rh 5100    Blood Product Expiration Date 601093235573   Glucose, capillary   Collection Time: 11/19/20  3:47 PM  Result Value Ref Range   Glucose-Capillary 104 (H)  70 - 99 mg/dL  Glucose, capillary   Collection Time: 11/19/20  8:14 PM  Result Value Ref Range   Glucose-Capillary 104 (H) 70 - 99 mg/dL    Imaging Studies:    [place] MFM FETAL BPP WO NON STRESS  Result Date: 11/17/2020 ----------------------------------------------------------------------  OBSTETRICS REPORT                       (Signed Final 11/17/2020 10:31 am) ---------------------------------------------------------------------- Patient Info  ID #:       [id number]                          D.O.B.:  1980/10/20 (39 yrs)  Name:       Marisa Campbell             Visit Date: 11/17/2020 08:25 am ---------------------------------------------------------------------- Performed By  Attending:        Noralee Space MD        Ref. Address:     [address]                                                             [address]                                                             [place]  [address]  Performed By:     Hurman Horn          [facility]  Referred By:      [facility] ---------------------------------------------------------------------- Orders  #  Description                           Code        Ordered By  1  [place] MFM FETAL BPP WO NON               76819.01    CHARLIE PICKENS     STRESS  2  [place] MFM MCA DOPPLER                    76821.01    CHARLIE PICKENS  3  [place] MFM UA CORD DOPPLER                24268.34    CHARLIE PICKENS ----------------------------------------------------------------------  #  Order #                     Accession #                Episode #  1  [id number]                   [id number]                 [id number]  2  [id number]                   [id number]                 [id number]  3  [id number]                   [id number]                 [id number]  ---------------------------------------------------------------------- Indications  [redacted] weeks gestation of pregnancy                Z3A.93  Advanced maternal age multigravida 73+,        O66.523  third trimester (39 yrs)  Isoimmunization - Other                        [phone]  Medical complication of pregnancy              O26.90  (Isoimmunization with prior pregnancies)  Hypothyroid                                    O99.280 E03.9  Previous cesarean delivery, antepartum (x2)    O34.219  Poor obstetric history: Previous preterm       O09.219  delivery, antepartum (36 wks)  Obesity complicating pregnancy, third          O99.213  trimester (BMI 51)  Maternal care for known or suspected poor      O36.5930  fetal growth, third  trimester, not applicable or  unspecified IUGR ---------------------------------------------------------------------- Fetal Evaluation  Num Of Fetuses:         1  Fetal Heart Rate(bpm):  160  Cardiac Activity:       Observed  Presentation:           Cephalic  Placenta:               Anterior Fundal rt.  P. Cord Insertion:      Visualized, central  Amniotic Fluid  AFI FV:      Within normal limits  AFI Sum(cm)     %Tile       Largest Pocket(cm)  10.8            24          3.6  RUQ(cm)       RLQ(cm)       LUQ(cm)        LLQ(cm)  2.6           3.4           1.2            3.6 ---------------------------------------------------------------------- Biophysical Evaluation  Amniotic F.V:   Within normal limits       F. Tone:        Observed  F. Movement:    Observed                   Score:          8/8  F. Breathing:   Observed ---------------------------------------------------------------------- OB History  Blood Type:   AB+  Gravidity:    8         Term:   4        Prem:   1        SAB:   1  TOP:          1       Ectopic:  0        Living: 5 ---------------------------------------------------------------------- Gestational Age  LMP:           33w 0d        Date:  03/31/20                 EDD:    01/05/21  Clinical EDD:  Armida Sans 0d                                        EDD:   01/12/21  Best:          33w 1d     Det. By:  Melida Quitter 1st  (06/27/20)    EDD:   01/04/21 ---------------------------------------------------------------------- Doppler - Fetal Vessels  Umbilical Artery   S/D     %tile      RI    %tile      PI    %tile            ADFV    RDFV   2.32       32    0.57       36    0.86       46               No      No  Middle Cerebral Artery   S/D  RI               PI    %tile     PSV   MoM                                                     (cm/s)   3.89             0.74             0.86    < 2.5    55.62  1.2  Cerebroplacental Ratio                         MCA PI / UA PI     %tile                                      1     < 2.5 ---------------------------------------------------------------------- Impression  Patient is admitted with diagnosis of chronic hypertension  superimposed with preeclampsia with severe features. Fetal  growth restriction was confirmed on previous growth  assessment. She has increased anti-E and anti-c antibody  titer levels.  Amniotic fluid is normal and good fetal activity is seen.  Cephalic presentation. Antenatal testing is reassuring.  Umbilical artery Doppler showed normal forward diastolic  flow. Middle-cerebral artery Doppler showed normal peak  systolic velocity measurements. ---------------------------------------------------------------------- Recommendations  -Continue twice-weekly BPP till delivery. ----------------------------------------------------------------------                  Noralee Space, MD Electronically Signed Final Report   11/17/2020 10:31 am ----------------------------------------------------------------------  [place] MFM MCA DOPPLER  Result Date: 11/17/2020 ----------------------------------------------------------------------  OBSTETRICS REPORT                       (Signed Final 11/17/2020 10:31 am)  ---------------------------------------------------------------------- Patient Info  ID #:       [id number]                          D.O.B.:  [birth year] (39 yrs)  Name:       Marisa Campbell             Visit Date: 11/17/2020 08:25 am ---------------------------------------------------------------------- Performed By  Attending:        Noralee Space MD        Ref. Address:     [address]                                                             [address] [id number]  [place]                                                             [address]  Performed By:     Hurman Horn          [facility]  Referred By:      [facility] ---------------------------------------------------------------------- Orders  #  Description                           Code        Ordered By  1  [place] MFM FETAL BPP WO NON               76819.01    CHARLIE PICKENS     STRESS  2  [place] MFM MCA DOPPLER                    76821.01    CHARLIE PICKENS  3  [place] MFM UA CORD DOPPLER                19147.82    CHARLIE PICKENS ----------------------------------------------------------------------  #  Order #                     Accession #                Episode #  1  [id number]                   [id number]                 [id number]  2  [id number]                   [id number]                 [id number]  3  [id number]                   [id number]                 [id number] ---------------------------------------------------------------------- Indications  [redacted] weeks gestation of pregnancy                Z3A.56  Advanced maternal age multigravida 50+,        O67.523  third trimester (39 yrs)  Isoimmunization - Other                        O36.1910  Medical complication of pregnancy              O26.90  (Isoimmunization with prior pregnancies)  Hypothyroid                                     O99.280 E03.9  Previous cesarean  delivery, antepartum (x2)    O34.219  Poor obstetric history: Previous preterm       O09.219  delivery, antepartum (36 wks)  Obesity complicating pregnancy, third          O99.213  trimester (BMI 51)  Maternal care for known or suspected poor      O36.5930  fetal growth, third trimester, not applicable or  unspecified IUGR ---------------------------------------------------------------------- Fetal Evaluation  Num Of Fetuses:         1  Fetal Heart Rate(bpm):  160  Cardiac Activity:       Observed  Presentation:           Cephalic  Placenta:               Anterior Fundal rt.  P. Cord Insertion:      Visualized, central  Amniotic Fluid  AFI FV:      Within normal limits  AFI Sum(cm)     %Tile       Largest Pocket(cm)  10.8            24          3.6  RUQ(cm)       RLQ(cm)       LUQ(cm)        LLQ(cm)  2.6           3.4           1.2            3.6 ---------------------------------------------------------------------- Biophysical Evaluation  Amniotic F.V:   Within normal limits       F. Tone:        Observed  F. Movement:    Observed                   Score:          8/8  F. Breathing:   Observed ---------------------------------------------------------------------- OB History  Blood Type:   AB+  Gravidity:    8         Term:   4        Prem:   1        SAB:   1  TOP:          1       Ectopic:  0        Living: 5 ---------------------------------------------------------------------- Gestational Age  LMP:           33w 0d        Date:  03/31/20                 EDD:   01/05/21  Clinical EDD:  Armida Sans 0d                                        EDD:   01/12/21  Best:          33w 1d     Det. By:  Melida Quitter 1st  (06/27/20)    EDD:   01/04/21 ---------------------------------------------------------------------- Doppler - Fetal Vessels  Umbilical Artery   S/D     %tile      RI    %tile      PI    %tile            ADFV    RDFV   2.32       32  0.57       36    0.86        46               No      No  Middle Cerebral Artery   S/D                RI               PI    %tile     PSV   MoM                                                     (cm/s)   3.89             0.74             0.86    < 2.5    55.62  1.2  Cerebroplacental Ratio                         MCA PI / UA PI     %tile                                      1     < 2.5 ---------------------------------------------------------------------- Impression  Patient is admitted with diagnosis of chronic hypertension  superimposed with preeclampsia with severe features. Fetal  growth restriction was confirmed on previous growth  assessment. She has increased anti-E and anti-c antibody  titer levels.  Amniotic fluid is normal and good fetal activity is seen.  Cephalic presentation. Antenatal testing is reassuring.  Umbilical artery Doppler showed normal forward diastolic  flow. Middle-cerebral artery Doppler showed normal peak  systolic velocity measurements. ---------------------------------------------------------------------- Recommendations  -Continue twice-weekly BPP till delivery. ----------------------------------------------------------------------                  Noralee Spaceavi Shankar, MD Electronically Signed Final Report   11/17/2020 10:31 am ----------------------------------------------------------------------  US MFM UA CORD DOPPLER  Result Date: 11/17/2020 ----------------------------------------------------------------------  OBSTETRICS REPORT                       (Signed Final 11/17/2020 10:31 am) ---------------------------------------------------------------------- Patient Info  ID #:       [id number]                          D.O.B.:  September 01, 1980 (39 yrs)  Name:       Marisa GessEBECCA A Campbell             Visit Date: 11/17/2020 08:25 am ---------------------------------------------------------------------- Performed By  Attending:        Noralee Spaceavi Shankar MD        Ref. Address:     [address]                                                              [address]  [address]                                                             [place]                                                             [address]  Performed By:     Hurman Horn          [facility]  Referred By:      [facility] ---------------------------------------------------------------------- Orders  #  Description                           Code        Ordered By  1  [place] MFM FETAL BPP WO NON               76819.01    CHARLIE PICKENS     STRESS  2  [place] MFM MCA DOPPLER                    76821.01    CHARLIE PICKENS  3  [place] MFM UA CORD DOPPLER                60454.09    CHARLIE PICKENS ----------------------------------------------------------------------  #  Order #                     Accession #                Episode #  1  [id number]                   [id number]                 [id number]  2  [id number]                   [id number]                 [id number]  3  [id number]                   [id number]                 [id number] ---------------------------------------------------------------------- Indications  [redacted] weeks gestation of pregnancy                Z3A.37  Advanced maternal age multigravida 95+,        O12.523  third trimester (39 yrs)  Isoimmunization - Other                        O36.1910  Medical complication of pregnancy  O26.90  (Isoimmunization with prior pregnancies)  Hypothyroid                                    O99.280 E03.9  Previous cesarean delivery, antepartum (x2)    O34.219  Poor obstetric history: Previous preterm       O09.219  delivery, antepartum (36 wks)  Obesity complicating pregnancy, third          O99.213  trimester (BMI 51)  Maternal care for known or suspected poor      O36.5930  fetal growth, third trimester, not applicable or  unspecified IUGR  ---------------------------------------------------------------------- Fetal Evaluation  Num Of Fetuses:         1  Fetal Heart Rate(bpm):  160  Cardiac Activity:       Observed  Presentation:           Cephalic  Placenta:               Anterior Fundal rt.  P. Cord Insertion:      Visualized, central  Amniotic Fluid  AFI FV:      Within normal limits  AFI Sum(cm)     %Tile       Largest Pocket(cm)  10.8            24          3.6  RUQ(cm)       RLQ(cm)       LUQ(cm)        LLQ(cm)  2.6           3.4           1.2            3.6 ---------------------------------------------------------------------- Biophysical Evaluation  Amniotic F.V:   Within normal limits       F. Tone:        Observed  F. Movement:    Observed                   Score:          8/8  F. Breathing:   Observed ---------------------------------------------------------------------- OB History  Blood Type:   AB+  Gravidity:    8         Term:   4        Prem:   1        SAB:   1  TOP:          1       Ectopic:  0        Living: 5 ---------------------------------------------------------------------- Gestational Age  LMP:           33w 0d        Date:  03/31/20                 EDD:   01/05/21  Clinical EDD:  Armida Sans 0d                                        EDD:   01/12/21  Best:          33w 1d     Det. By:  Melida Quitter 1st  (06/27/20)    EDD:   01/04/21 ---------------------------------------------------------------------- Doppler - Fetal Vessels  Umbilical Artery   S/D     %tile  RI    %tile      PI    %tile            ADFV    RDFV   2.32       32    0.57       36    0.86       46               No      No  Middle Cerebral Artery   S/D                RI               PI    %tile     PSV   MoM                                                     (cm/s)   3.89             0.74             0.86    < 2.5    55.62  1.2  Cerebroplacental Ratio                         MCA PI / UA PI     %tile                                      1     < 2.5  ---------------------------------------------------------------------- Impression  Patient is admitted with diagnosis of chronic hypertension  superimposed with preeclampsia with severe features. Fetal  growth restriction was confirmed on previous growth  assessment. She has increased anti-E and anti-c antibody  titer levels.  Amniotic fluid is normal and good fetal activity is seen.  Cephalic presentation. Antenatal testing is reassuring.  Umbilical artery Doppler showed normal forward diastolic  flow. Middle-cerebral artery Doppler showed normal peak  systolic velocity measurements. ---------------------------------------------------------------------- Recommendations  -Continue twice-weekly BPP till delivery. ----------------------------------------------------------------------                  Noralee Space, MD Electronically Signed Final Report   11/17/2020 10:31 am ----------------------------------------------------------------------    Medications:  Scheduled . aspirin EC  81 mg Oral Daily  . docusate sodium  100 mg Oral Daily  . famotidine  20 mg Oral Daily  . insulin aspart  0-14 Units Subcutaneous TID PC  . insulin aspart  0-14 Units Subcutaneous QAC breakfast  . levothyroxine  137 mcg Oral QAC breakfast  . NIFEdipine  30 mg Oral BID  . prenatal multivitamin  1 tablet Oral Q1200  . sodium chloride flush  3 mL Intravenous Q12H   I have reviewed the patient's current medications.  ASSESSMENT: [id number] [redacted]w[redacted]d Estimated Date of Delivery: 01/04/21  1) Severe preeclampsia -BP stable on Procardia 30mg  bid -s/p Mag x 24hr -currently asymptomatic, labs stable q 72hr- last completed 5/15  2)GDMA1- no scheduled medication CBG well controlled sliding scale as needed  3) Fetal well being -NICHD- Cat. I -s/p BMZ 5/6-5/7 -continue BPP/dopplers twice weekly (Tues/Fri) -continue NST q shift  4) Alloisoimmunization -anti-C and anti-E  5) h/o prior C-section and VBAC - Patient  desires  TOLAC.  Reviewed care plan and expectations for delivery at 34wks  6) Maternal well being -BMI 50s- ambulation as tolerated, SCDs while in bed -GBS positive: treatment in labor -pelvic pain- concern for pubic diastasis- tylenol prn and abdominal binder  Continue routine prenatal care.  Discussed with patient today plan to review with Dr. Celine Mans for Thursday regarding route of delivery  Myna Hidalgo, DO Attending Obstetrician & Gynecologist, [facility] for [employer], [facility]

## 2020-11-20 NOTE — Progress Notes (Signed)
CBG was 88 before meal.

## 2020-11-21 ENCOUNTER — Ambulatory Visit: Payer: Medicaid Other

## 2020-11-21 ENCOUNTER — Inpatient Hospital Stay (HOSPITAL_BASED_OUTPATIENT_CLINIC_OR_DEPARTMENT_OTHER): Payer: Medicaid Other

## 2020-11-21 DIAGNOSIS — E039 Hypothyroidism, unspecified: Secondary | ICD-10-CM

## 2020-11-21 DIAGNOSIS — O09213 Supervision of pregnancy with history of pre-term labor, third trimester: Secondary | ICD-10-CM

## 2020-11-21 DIAGNOSIS — O99820 Streptococcus B carrier state complicating pregnancy: Secondary | ICD-10-CM | POA: Diagnosis not present

## 2020-11-21 DIAGNOSIS — Z3A33 33 weeks gestation of pregnancy: Secondary | ICD-10-CM

## 2020-11-21 DIAGNOSIS — O09523 Supervision of elderly multigravida, third trimester: Secondary | ICD-10-CM

## 2020-11-21 DIAGNOSIS — O365930 Maternal care for other known or suspected poor fetal growth, third trimester, not applicable or unspecified: Secondary | ICD-10-CM

## 2020-11-21 DIAGNOSIS — O34219 Maternal care for unspecified type scar from previous cesarean delivery: Secondary | ICD-10-CM

## 2020-11-21 DIAGNOSIS — O99213 Obesity complicating pregnancy, third trimester: Secondary | ICD-10-CM

## 2020-11-21 DIAGNOSIS — O24410 Gestational diabetes mellitus in pregnancy, diet controlled: Secondary | ICD-10-CM | POA: Diagnosis not present

## 2020-11-21 DIAGNOSIS — O361910 Maternal care for other isoimmunization, first trimester, not applicable or unspecified: Secondary | ICD-10-CM | POA: Diagnosis not present

## 2020-11-21 DIAGNOSIS — O99283 Endocrine, nutritional and metabolic diseases complicating pregnancy, third trimester: Secondary | ICD-10-CM

## 2020-11-21 DIAGNOSIS — E669 Obesity, unspecified: Secondary | ICD-10-CM

## 2020-11-21 DIAGNOSIS — O1413 Severe pre-eclampsia, third trimester: Secondary | ICD-10-CM | POA: Diagnosis not present

## 2020-11-21 LAB — GLUCOSE, CAPILLARY
Glucose-Capillary: 94 mg/dL (ref 70–99)
Glucose-Capillary: 92 mg/dL (ref 70–99)
Glucose-Capillary: 101 mg/dL — ABNORMAL HIGH (ref 70–99)
Glucose-Capillary: 121 mg/dL — ABNORMAL HIGH (ref 70–99)
Glucose-Capillary: 123 mg/dL — ABNORMAL HIGH (ref 70–99)
Glucose-Capillary: 133 mg/dL — ABNORMAL HIGH (ref 70–99)

## 2020-11-21 MED ORDER — VALACYCLOVIR HCL 500 MG PO TABS
500.0000 mg | ORAL_TABLET | Freq: Two times a day (BID) | ORAL | Status: DC
  Administered 2020-11-21 – 2020-11-25 (×8): 500 mg via ORAL
  Filled 2020-11-21 (×8): qty 1

## 2020-11-21 MED ORDER — BETAMETHASONE SOD PHOS & ACET 6 (3-3) MG/ML IJ SUSP
12.0000 mg | INTRAMUSCULAR | Status: AC
  Administered 2020-11-21 – 2020-11-22 (×2): 12 mg via INTRAMUSCULAR
  Filled 2020-11-21 (×2): qty 5

## 2020-11-21 NOTE — Progress Notes (Signed)
Patient ID: Marisa Campbell, female   DOB: 10-17-1980, 40 y.o.   MRN: [id number] ACULTY PRACTICE ANTEPARTUM COMPREHENSIVE PROGRESS NOTE  Marisa Campbell is a 40 y.o. [id number] at [redacted]w[redacted]d  who is admitted for [facility] with severe SIPEC.   Fetal presentation is cephalic. Length of Stay:  11  Days  Subjective: Pt without complaints except for some cold sores. Denies HA or visual chnages Patient reports good fetal movement.  She reports n0 uterine contractions, no bleeding and no loss of fluid per vagina.  Vitals:  Blood pressure 119/61, pulse (!) 59, temperature 98.2 F (36.8 C), temperature source Oral, resp. rate 20, last menstrual period 03/30/2020, SpO2 98 %, currently breastfeeding. Physical Examination: Lungs clear Heart RRR Abd soft + BS gravid Ext non tender  Fetal Monitoring:  120-130's, + acels, no decels, no ut ctx  Labs:  Results for orders placed or performed during the hospital encounter of 11/10/20 (from the past 24 hour(s))  Glucose, capillary   Collection Time: 11/20/20  9:29 AM  Result Value Ref Range   Glucose-Capillary 88 70 - 99 mg/dL  Glucose, capillary   Collection Time: 11/20/20 11:25 AM  Result Value Ref Range   Glucose-Capillary 107 (H) 70 - 99 mg/dL  Glucose, capillary   Collection Time: 11/20/20  4:09 PM  Result Value Ref Range   Glucose-Capillary 125 (H) 70 - 99 mg/dL  Glucose, capillary   Collection Time: 11/20/20 11:28 PM  Result Value Ref Range   Glucose-Capillary 126 (H) 70 - 99 mg/dL  Glucose, capillary   Collection Time: 11/21/20  8:00 AM  Result Value Ref Range   Glucose-Capillary 94 70 - 99 mg/dL    Imaging Studies:    NA   Medications:  Scheduled . aspirin EC  81 mg Oral Daily  . docusate sodium  100 mg Oral Daily  . famotidine  20 mg Oral Daily  . insulin aspart  0-14 Units Subcutaneous TID PC  . insulin aspart  0-14 Units Subcutaneous QAC breakfast  . levothyroxine  137 mcg Oral QAC breakfast  . NIFEdipine  30 mg Oral BID  .  prenatal multivitamin  1 tablet Oral Q1200  . sodium chloride flush  3 mL Intravenous Q12H  . valACYclovir  500 mg Oral BID   I have reviewed the patient's current medications.  ASSESSMENT: IUP 33 5/7 weeks CHTN with severe SIPEC GDM A1 Alloisoimmunization GBS + H/O c section and VBAC  PLAN: Stable. Continue with twice weekly antenatal testing. Valtrex for cold sores. IOL at 34 weeks. Will have foley placed Thursday night for IOL.  Continue routine antenatal care.   Hermina Staggers 11/21/2020,8:16 AM

## 2020-11-21 NOTE — Consult Note (Signed)
MFM Note  Marisa Campbell has been hospitalized due to chronic hypertension with superimposed severe preeclampsia and an 2 IUGR fetus.  Her pregnancy has also been complicated by maternal obesity and isoimmunization.  A biophysical profile performed today was 4 out of 8.  She received a -2 for an absent amniotic fluid pocket of greater than 2 cm and another -2 for absent fetal movements.  The total AFI level was only 2.9 cm.  This is a significant drop from her prior ultrasound exam.  Due to a biophysical profile of 4 out of 8, the patient will be placed on continuous monitoring.  She will have a repeat biophysical profile performed tomorrow.  Doppler studies of the umbilical arteries performed today continues to show normal forward flow with an S/D ratio of 2.6.  There were no signs of absent or reversed end-diastolic flow noted.    The peak systolic velocity of the middle cerebral artery did not indicate any signs of fetal anemia.  As the patient would like to attempt a TOLAC and a provider who is comfortable with letting her attempt a TOL AC with two prior C-sections is not available until later in the week, she will be placed on continuous monitoring for now. We will repeat another biophysical profile tomorrow.  She will receive a rescue course of antenatal corticosteroids.  Delivery would be indicated at any time for nonreassuring fetal status.

## 2020-11-22 ENCOUNTER — Inpatient Hospital Stay (HOSPITAL_BASED_OUTPATIENT_CLINIC_OR_DEPARTMENT_OTHER): Payer: Medicaid Other

## 2020-11-22 DIAGNOSIS — O361930 Maternal care for other isoimmunization, third trimester, not applicable or unspecified: Secondary | ICD-10-CM | POA: Diagnosis not present

## 2020-11-22 DIAGNOSIS — O99283 Endocrine, nutritional and metabolic diseases complicating pregnancy, third trimester: Secondary | ICD-10-CM

## 2020-11-22 DIAGNOSIS — O365930 Maternal care for other known or suspected poor fetal growth, third trimester, not applicable or unspecified: Secondary | ICD-10-CM

## 2020-11-22 DIAGNOSIS — E039 Hypothyroidism, unspecified: Secondary | ICD-10-CM

## 2020-11-22 DIAGNOSIS — O09523 Supervision of elderly multigravida, third trimester: Secondary | ICD-10-CM | POA: Diagnosis not present

## 2020-11-22 DIAGNOSIS — E669 Obesity, unspecified: Secondary | ICD-10-CM

## 2020-11-22 DIAGNOSIS — O4103X0 Oligohydramnios, third trimester, not applicable or unspecified: Secondary | ICD-10-CM

## 2020-11-22 DIAGNOSIS — Z3A33 33 weeks gestation of pregnancy: Secondary | ICD-10-CM | POA: Diagnosis not present

## 2020-11-22 DIAGNOSIS — O34219 Maternal care for unspecified type scar from previous cesarean delivery: Secondary | ICD-10-CM

## 2020-11-22 DIAGNOSIS — O09293 Supervision of pregnancy with other poor reproductive or obstetric history, third trimester: Secondary | ICD-10-CM

## 2020-11-22 DIAGNOSIS — O1413 Severe pre-eclampsia, third trimester: Secondary | ICD-10-CM | POA: Diagnosis not present

## 2020-11-22 DIAGNOSIS — O99213 Obesity complicating pregnancy, third trimester: Secondary | ICD-10-CM

## 2020-11-22 LAB — COMPREHENSIVE METABOLIC PANEL
ALT: 24 U/L (ref 0–44)
AST: 19 U/L (ref 15–41)
Albumin: 2.5 g/dL — ABNORMAL LOW (ref 3.5–5.0)
Alkaline Phosphatase: 82 U/L (ref 38–126)
Anion gap: 6 (ref 5–15)
BUN: 17 mg/dL (ref 6–20)
CO2: 21 mmol/L — ABNORMAL LOW (ref 22–32)
Calcium: 8.8 mg/dL — ABNORMAL LOW (ref 8.9–10.3)
Chloride: 105 mmol/L (ref 98–111)
Creatinine, Ser: 0.69 mg/dL (ref 0.44–1.00)
GFR, Estimated: >60 mL/min (ref >60)
Glucose, Bld: 98 mg/dL (ref 70–99)
Potassium: 4.2 mmol/L (ref 3.5–5.1)
Sodium: 132 mmol/L — ABNORMAL LOW (ref 135–145)
Total Bilirubin: 0.4 mg/dL (ref 0.3–1.2)
Total Protein: 5.6 g/dL — ABNORMAL LOW (ref 6.5–8.1)

## 2020-11-22 LAB — CBC
HCT: 33.8 % — ABNORMAL LOW (ref 36.0–46.0)
Hemoglobin: 11.9 g/dL — ABNORMAL LOW (ref 12.0–15.0)
MCH: 30.1 pg (ref 26.0–34.0)
MCHC: 35.2 g/dL (ref 30.0–36.0)
MCV: 85.6 fL (ref 80.0–100.0)
Platelets: 161 10*3/uL (ref 150–400)
RBC: 3.95 MIL/uL (ref 3.87–5.11)
RDW: 13.1 % (ref 11.5–15.5)
WBC: 14.5 10*3/uL — ABNORMAL HIGH (ref 4.0–10.5)
nRBC: 0 % (ref 0.0–0.2)

## 2020-11-22 LAB — TYPE AND SCREEN
ABO/RH(D): AB POS
Antibody Screen: POSITIVE
Unit division: 0
Unit division: 0

## 2020-11-22 LAB — BPAM RBC
Blood Product Expiration Date: 202205282359
Blood Product Expiration Date: 202205282359
Unit Type and Rh: 5100
Unit Type and Rh: 5100

## 2020-11-22 LAB — GLUCOSE, CAPILLARY
Glucose-Capillary: 101 mg/dL — ABNORMAL HIGH (ref 70–99)
Glucose-Capillary: 104 mg/dL — ABNORMAL HIGH (ref 70–99)
Glucose-Capillary: 141 mg/dL — ABNORMAL HIGH (ref 70–99)
Glucose-Capillary: 143 mg/dL — ABNORMAL HIGH (ref 70–99)

## 2020-11-22 NOTE — Progress Notes (Signed)
FACULTY PRACTICE ANTEPARTUM(COMPREHENSIVE) NOTE  Marisa Campbell is a 40 y.o. [id number] with Estimated Date of Delivery: 01/04/21   By  best clinical estimate [redacted]w[redacted]d  who is admitted for preeclampsia with severe features.    Fetal presentation is cephalic. Length of Stay:  12  Days  Date of admission:11/10/2020  Subjective: Pt reports no acute complaints today.  Denies headache, blurry vision, no RUQ pain.  No nausea/vomiting.  Still having intermittent Deberah Pelton. Patient reports the fetal movement as active. Patient reports uterine contraction  activity as above. Patient reports  vaginal bleeding as none. Patient describes fluid per vagina as None.  Vitals:  Blood pressure 140/64, pulse 67, temperature 98.2 F (36.8 C), temperature source Oral, resp. rate 18, last menstrual period 03/30/2020, SpO2 100 %, currently breastfeeding. Vitals:   11/22/20 0750 11/22/20 0754 11/22/20 0755 11/22/20 0800  BP:  140/64    Pulse:  67    Resp:  18    Temp:  98.2 F (36.8 C)    TempSrc:  Oral    SpO2: 98% 100% 100% 100%   Physical Examination:  General appearance - alert, well appearing, and in no distress Mental status - normal mood, behavior, speech, dress, motor activity, and thought processes Chest - clear to auscultation, no wheezes, rales or rhonchi, symmetric air entry Heart - normal rate and regular rhythm Abdomen - obese, gravid, non-tender Extremities - no edema, no calf tenderness bilaterally Skin - warm and dry   Fetal Monitoring:  Baseline: 130 bpm, Variability: moderate, Accelerations: +15x15 accels, and Decelerations: Absent    reactive  Labs:  Results for orders placed or performed during the hospital encounter of 11/10/20 (from the past 24 hour(s))  Glucose, capillary   Collection Time: 11/21/20 11:21 AM  Result Value Ref Range   Glucose-Capillary 101 (H) 70 - 99 mg/dL  Glucose, capillary   Collection Time: 11/21/20  3:17 PM  Result Value Ref Range    Glucose-Capillary 121 (H) 70 - 99 mg/dL  Glucose, capillary   Collection Time: 11/21/20  7:34 PM  Result Value Ref Range   Glucose-Capillary 133 (H) 70 - 99 mg/dL  Glucose, capillary   Collection Time: 11/21/20  8:29 PM  Result Value Ref Range   Glucose-Capillary 123 (H) 70 - 99 mg/dL  Comprehensive metabolic panel   Collection Time: 11/22/20  7:20 AM  Result Value Ref Range   Sodium 132 (L) 135 - 145 mmol/L   Potassium 4.2 3.5 - 5.1 mmol/L   Chloride 105 98 - 111 mmol/L   CO2 21 (L) 22 - 32 mmol/L   Glucose, Bld 98 70 - 99 mg/dL   BUN 17 6 - 20 mg/dL   Creatinine, Ser 1.30 0.44 - 1.00 mg/dL   Calcium 8.8 (L) 8.9 - 10.3 mg/dL   Total Protein 5.6 (L) 6.5 - 8.1 g/dL   Albumin 2.5 (L) 3.5 - 5.0 g/dL   AST 19 15 - 41 U/L   ALT 24 0 - 44 U/L   Alkaline Phosphatase 82 38 - 126 U/L   Total Bilirubin 0.4 0.3 - 1.2 mg/dL   GFR, Estimated >86 >57 mL/min   Anion gap 6 5 - 15  CBC   Collection Time: 11/22/20  7:20 AM  Result Value Ref Range   WBC 14.5 (H) 4.0 - 10.5 K/uL   RBC 3.95 3.87 - 5.11 MIL/uL   Hemoglobin 11.9 (L) 12.0 - 15.0 g/dL   HCT 84.6 (L) 96.2 - 95.2 %   MCV 85.6  80.0 - 100.0 fL   MCH 30.1 26.0 - 34.0 pg   MCHC 35.2 30.0 - 36.0 g/dL   RDW 37.1 69.6 - 78.9 %   Platelets 161 150 - 400 K/uL   nRBC 0.0 0.0 - 0.2 %  Type and screen   Collection Time: 11/22/20  7:20 AM  Result Value Ref Range   ABO/RH(D) AB POS    Antibody Screen POS    Sample Expiration      11/25/2020,2359 Performed at [facility], [address] [place], [place] [address]   Glucose, capillary   Collection Time: 11/22/20 10:23 AM  Result Value Ref Range   Glucose-Capillary 104 (H) 70 - 99 mg/dL    Imaging Studies:    5/18-  BPP to be completed today.  Prelim read- BPP 8/8, AFI improved to 5  Medications:  Scheduled . aspirin EC  81 mg Oral Daily  . betamethasone acetate-betamethasone sodium phosphate  12 mg Intramuscular Q24H  . docusate sodium  100 mg Oral Daily  . famotidine  20  mg Oral Daily  . insulin aspart  0-14 Units Subcutaneous TID PC  . insulin aspart  0-14 Units Subcutaneous QAC breakfast  . levothyroxine  137 mcg Oral QAC breakfast  . NIFEdipine  30 mg Oral BID  . prenatal multivitamin  1 tablet Oral Q1200  . sodium chloride flush  3 mL Intravenous Q12H  . valACYclovir  500 mg Oral BID   I have reviewed the patient's current medications.  ASSESSMENT: [id number] [redacted]w[redacted]d Estimated Date of Delivery: 01/04/21  Admitted due to cHTN with superimposed preeclampsia with severe features H/o C-section x 2, h/o VBAC, desires TOLAC Obesity GBS positive GDMA1  PLAN: 1) Severe preeclampsia -BP stable on Procardia 30mg  bid -s/p Mag x 24hr -currently asymptomatic, labs stable q 72hr- completed today  2)GDMA1- no scheduled medication CBG mostly controlled sliding scale as needed  3) Fetal well being -NICHD- Cat. I -see Dr. prior note- FHT remained reassuring x 24hr and BPP improved today.  Will transition back to NST q shift with plan to proceed with delivery @ 34wk as previously planned -continue BPP twice weekly  4) Alloisoimmunization -anti-C and anti-E  5) h/o prior C-section and VBAC -Patient desiresTOLAC. Discussed IOL with Foley balloon  6) Maternal well being -BMI 50s- ambulation as tolerated, SCDs while in bed -GBS positive: treatment in labor  Loretta Plume, DO Attending Obstetrician & Gynecologist, [facility], [facility]

## 2020-11-23 ENCOUNTER — Other Ambulatory Visit: Payer: Self-pay

## 2020-11-23 ENCOUNTER — Inpatient Hospital Stay (HOSPITAL_COMMUNITY): Payer: Medicaid Other

## 2020-11-23 ENCOUNTER — Encounter (HOSPITAL_COMMUNITY): Payer: Self-pay | Admitting: Obstetrics & Gynecology

## 2020-11-23 DIAGNOSIS — O113 Pre-existing hypertension with pre-eclampsia, third trimester: Secondary | ICD-10-CM | POA: Diagnosis not present

## 2020-11-23 DIAGNOSIS — Z3A34 34 weeks gestation of pregnancy: Secondary | ICD-10-CM

## 2020-11-23 DIAGNOSIS — O10913 Unspecified pre-existing hypertension complicating pregnancy, third trimester: Secondary | ICD-10-CM | POA: Diagnosis not present

## 2020-11-23 LAB — GLUCOSE, CAPILLARY
Glucose-Capillary: 107 mg/dL — ABNORMAL HIGH (ref 70–99)
Glucose-Capillary: 118 mg/dL — ABNORMAL HIGH (ref 70–99)
Glucose-Capillary: 99 mg/dL (ref 70–99)
Glucose-Capillary: 120 mg/dL — ABNORMAL HIGH (ref 70–99)

## 2020-11-23 LAB — PREPARE RBC (CROSSMATCH)

## 2020-11-23 MED ORDER — OXYTOCIN BOLUS FROM INFUSION
333.0000 mL | Freq: Once | INTRAVENOUS | Status: AC
  Administered 2020-11-24: 333 mL via INTRAVENOUS

## 2020-11-23 MED ORDER — SODIUM CHLORIDE 0.9 % IV SOLN
5.0000 10*6.[IU] | Freq: Once | INTRAVENOUS | Status: AC
  Administered 2020-11-23: 5 10*6.[IU] via INTRAVENOUS
  Filled 2020-11-23: qty 5

## 2020-11-23 MED ORDER — LACTATED RINGERS IV SOLN: 500.0000 mL | INTRAVENOUS | Status: DC | PRN

## 2020-11-23 MED ORDER — ONDANSETRON HCL 4 MG/2ML IJ SOLN
4.0000 mg | Freq: Four times a day (QID) | INTRAMUSCULAR | Status: DC | PRN
  Administered 2020-11-24 (×2): 4 mg via INTRAVENOUS
  Filled 2020-11-23 (×2): qty 2

## 2020-11-23 MED ORDER — INSULIN ASPART 100 UNIT/ML IJ SOLN: 0.0000 [IU] | Freq: Four times a day (QID) | INTRAMUSCULAR | Status: DC

## 2020-11-23 MED ORDER — HYDROXYZINE HCL 50 MG PO TABS: 50.0000 mg | ORAL_TABLET | Freq: Four times a day (QID) | ORAL | Status: DC | PRN

## 2020-11-23 MED ORDER — LACTATED RINGERS IV SOLN: INTRAVENOUS | Status: DC

## 2020-11-23 MED ORDER — FLEET ENEMA 7-19 GM/118ML RE ENEM: 1.0000 | ENEMA | Freq: Every day | RECTAL | Status: DC | PRN

## 2020-11-23 MED ORDER — OXYTOCIN-SODIUM CHLORIDE 30-0.9 UT/500ML-% IV SOLN
1.0000 m[IU]/min | INTRAVENOUS | Status: DC
  Administered 2020-11-24: 1 m[IU]/min via INTRAVENOUS
  Filled 2020-11-23: qty 500

## 2020-11-23 MED ORDER — PENICILLIN G POT IN DEXTROSE 60000 UNIT/ML IV SOLN
3.0000 10*6.[IU] | INTRAVENOUS | Status: DC
  Administered 2020-11-24 (×5): 3 10*6.[IU] via INTRAVENOUS
  Filled 2020-11-23 (×5): qty 50

## 2020-11-23 MED ORDER — ACETAMINOPHEN 325 MG PO TABS
650.0000 mg | ORAL_TABLET | ORAL | Status: DC | PRN
  Administered 2020-11-24: 650 mg via ORAL

## 2020-11-23 MED ORDER — OXYTOCIN-SODIUM CHLORIDE 30-0.9 UT/500ML-% IV SOLN
2.5000 [IU]/h | INTRAVENOUS | Status: DC
  Administered 2020-11-24: 2.5 [IU]/h via INTRAVENOUS

## 2020-11-23 MED ORDER — FENTANYL CITRATE (PF) 100 MCG/2ML IJ SOLN
50.0000 ug | INTRAMUSCULAR | Status: DC | PRN
  Administered 2020-11-24 (×3): 100 ug via INTRAVENOUS
  Filled 2020-11-23 (×3): qty 2

## 2020-11-23 MED ORDER — MAGNESIUM SULFATE BOLUS VIA INFUSION
4.0000 g | Freq: Once | INTRAVENOUS | Status: AC
  Administered 2020-11-23: 4 g via INTRAVENOUS
  Filled 2020-11-23: qty 1000

## 2020-11-23 MED ORDER — MAGNESIUM SULFATE 40 GM/1000ML IV SOLN
2.0000 g/h | INTRAVENOUS | Status: AC
  Administered 2020-11-23 – 2020-11-24 (×2): 2 g/h via INTRAVENOUS
  Filled 2020-11-23 (×2): qty 1000

## 2020-11-23 MED ORDER — SOD CITRATE-CITRIC ACID 500-334 MG/5ML PO SOLN: 30.0000 mL | ORAL | Status: DC | PRN

## 2020-11-23 MED ORDER — TERBUTALINE SULFATE 1 MG/ML IJ SOLN
0.2500 mg | Freq: Once | INTRAMUSCULAR | Status: AC | PRN
  Administered 2020-11-24: 0.25 mg via SUBCUTANEOUS

## 2020-11-23 MED ORDER — OXYCODONE-ACETAMINOPHEN 5-325 MG PO TABS: 2.0000 | ORAL_TABLET | ORAL | Status: DC | PRN

## 2020-11-23 MED ORDER — LIDOCAINE HCL (PF) 1 % IJ SOLN: 30.0000 mL | INTRAMUSCULAR | Status: DC | PRN

## 2020-11-23 MED ORDER — ZOLPIDEM TARTRATE 5 MG PO TABS: 5.0000 mg | ORAL_TABLET | Freq: Every evening | ORAL | Status: DC | PRN

## 2020-11-23 MED ORDER — OXYCODONE-ACETAMINOPHEN 5-325 MG PO TABS: 1.0000 | ORAL_TABLET | ORAL | Status: DC | PRN

## 2020-11-23 NOTE — Progress Notes (Signed)
Patient Vitals for the past 4 hrs:  BP Temp Temp src Pulse Resp SpO2  11/23/20 2343 (!) 157/82 98.2 F (36.8 C) Oral 68 18 --  11/23/20 2332 (!) 152/76 -- -- 62 -- --  11/23/20 2316 (!) 156/85 97.6 F (36.4 C) Oral 62 18 97 %  11/23/20 2229 137/76 -- -- 62 -- --  11/23/20 2211 (!) 167/92 98.2 F (36.8 C) Oral 63 18 --   Cx 0.5/thick/-3. FHR Cat 1, no ctx. Foley placed and inflated w/60cc H20.  Pt would prefer to rest tonight if possible; if augmentation needed after foley falls out, will try nipple stimulation first (heard that Pitocin makes "labor hurt twice as much).

## 2020-11-23 NOTE — Plan of Care (Signed)
  Problem: Health Behavior/Discharge Planning: Goal: Ability to manage health-related needs will improve Outcome: Progressing   Problem: Clinical Measurements: Goal: Ability to maintain clinical measurements within normal limits will improve Outcome: Progressing Goal: Will remain free from infection Outcome: Progressing Goal: Diagnostic test results will improve Outcome: Progressing Goal: Cardiovascular complication will be avoided Outcome: Progressing   Problem: Pain Managment: Goal: General experience of comfort will improve Outcome: Progressing   Problem: Safety: Goal: Ability to remain free from injury will improve Outcome: Progressing   Problem: Skin Integrity: Goal: Risk for impaired skin integrity will decrease Outcome: Progressing   Problem: Education: Goal: Individualized Educational Video(s) Outcome: Progressing   Problem: Fluid Volume: Goal: Peripheral tissue perfusion will improve Outcome: Progressing   Problem: Clinical Measurements: Goal: Complications related to disease process, condition or treatment will be avoided or minimized Outcome: Progressing

## 2020-11-23 NOTE — Progress Notes (Signed)
FACULTY PRACTICE ANTEPARTUM(COMPREHENSIVE) NOTE  Marisa Campbell is a 40 y.o. [id number] with Estimated Date of Delivery: 01/04/21   By  best clinical estimate [redacted]w[redacted]d  who is admitted for cHTN with superimposed preeclampsia    Fetal presentation is cephalic. Length of Stay:  13  Days  Date of admission:11/10/2020  Subjective: No acute complaints overnight.  Denies headache, blurry vision, no RUQ pain. Patient reports the fetal movement as active. Patient reports uterine contraction  Activity- irregular Braxton Hicks. Patient reports  vaginal bleeding as none. Patient describes fluid per vagina as None.  Vitals:  Blood pressure (!) 142/83, pulse 61, temperature 98.3 F (36.8 C), temperature source Oral, resp. rate 17, last menstrual period 03/30/2020, SpO2 97 %, currently breastfeeding. Vitals:   11/22/20 1916 11/22/20 2231 11/23/20 0614 11/23/20 0825  BP: (!) 148/78 (!) 155/78 130/73 (!) 142/83  Pulse: 68 71 61 61  Resp: 20 18 18 17   Temp: 98.6 F (37 C) 98.2 F (36.8 C) 98.1 F (36.7 C) 98.3 F (36.8 C)  TempSrc: Oral Oral Oral Oral  SpO2: 100% 99% 100% 97%   Physical Examination:  General appearance - alert, well appearing, and in no distress Mental status - normal mood and behavior Chest - clear to auscultation, no wheezes, rales or rhonchi, symmetric air entry Heart - normal rate and regular rhythm Abdomen - Obese, gravid, non-tender Extremities - no pedal edema noted, no calf tenderness bilaterally Skin - warm and dry   Fetal Monitoring:  Baseline: 140 bpm, Variability: moderate, Accelerations: +15x15 accels, and Decelerations: Absent    reactive  Labs:  Results for orders placed or performed during the hospital encounter of 11/10/20 (from the past 24 hour(s))  Glucose, capillary   Collection Time: 11/22/20  4:02 PM  Result Value Ref Range   Glucose-Capillary 141 (H) 70 - 99 mg/dL   Comment 1 Notify RN    Comment 2 Document in Chart   Glucose, capillary    Collection Time: 11/22/20  8:34 PM  Result Value Ref Range   Glucose-Capillary 143 (H) 70 - 99 mg/dL  Glucose, capillary   Collection Time: 11/23/20  8:19 AM  Result Value Ref Range   Glucose-Capillary 107 (H) 70 - 99 mg/dL   Comment 1 Notify RN    Comment 2 Document in Chart     Imaging Studies:    [place] MFM FETAL BPP WO NON STRESS  Result Date: 11/22/2020 ----------------------------------------------------------------------  OBSTETRICS REPORT                       (Signed Final 11/22/2020 04:27 pm) ---------------------------------------------------------------------- Patient Info  ID #:       [id number]                          D.O.B.:  04/05/1981 (39 yrs)  Name:       Marisa Campbell             Visit Date: 11/22/2020 08:26 am ---------------------------------------------------------------------- Performed By  Attending:        Lin Landsman      Ref. Address:     [address] Nestor Ramp                    MD  [address]                                                             [address]                                                             [place]                                                             [address]  Performed By:     Percell Boston          Secondary Phy.:   [facility]                    RDMS                                                             Care  Referred By:      [facility]             [facility] ---------------------------------------------------------------------- Orders  #  Description                           Code        Ordered By  1  [place] MFM FETAL BPP WO NON               84132.44    Myna Hidalgo     STRESS ----------------------------------------------------------------------  #  Order #                     Accession #                Episode #  1  [id number]                   [id number]                  [id number] ---------------------------------------------------------------------- Indications  [redacted] weeks gestation of pregnancy                Z3A.17  Advanced maternal age multigravida 9+,        O34.523  third trimester (39 yrs)  Maternal care for known or suspected poor  O36.5930  fetal growth, third trimester, not applicable or  unspecified IUGR  Isoimmunization - Other                        O36.1910  Medical complication of pregnancy              O26.90  (Isoimmunization with prior pregnancies)  Hypothyroid                                    O99.280 E03.9  Previous cesarean delivery, antepartum (x2)    O34.219  Poor obstetric history: Previous preterm       O09.219  delivery, antepartum (36 wks)  Obesity complicating pregnancy, third          O99.213  trimester (BMI 51) ---------------------------------------------------------------------- Fetal Evaluation  Num Of Fetuses:         1  Fetal Heart Rate(bpm):  119  Cardiac Activity:       Observed  Presentation:           Cephalic  Amniotic Fluid  AFI FV:      Subjectively decreased  AFI Sum(cm)     %Tile       Largest Pocket(cm)  5.9             < 3         2.7  RUQ(cm)       RLQ(cm)       LUQ(cm)        LLQ(cm)  0             2.7           1.1            2.1 ---------------------------------------------------------------------- Biophysical Evaluation  Amniotic F.V:   Pocket => 2 cm             F. Tone:        Observed  F. Movement:    Observed                   Score:          8/8  F. Breathing:   Observed ---------------------------------------------------------------------- OB History  Blood Type:   AB+  Gravidity:    8         Term:   4        Prem:   1        SAB:   1  TOP:          1       Ectopic:  0        Living: 5 ---------------------------------------------------------------------- Gestational Age  LMP:           33w 5d        Date:  03/31/20                 EDD:   01/05/21  Clinical EDD:  Marisa Campbell 5d                                        EDD:    01/12/21  Best:          33w 6d     Det. By:  Marisa Campbell 1st  (06/27/20)    EDD:   01/04/21 ---------------------------------------------------------------------- Anatomy  Thoracic:  Appears normal         Bladder:                Appears normal  Stomach:               Appears normal, left                         sided ---------------------------------------------------------------------- Impression  Antenatal testing performed given oligohydramnios on  previous exam and known fetal growth restriction.  The biophysical profile was 8/8 with good fetal movement and  amniotic fluid volume, normal but again subjectively low.  Cephalic presentation  Amniotic fluid has improved. ---------------------------------------------------------------------- Recommendations  Clinical correlation ----------------------------------------------------------------------               Lin Landsman, MD Electronically Signed Final Report   11/22/2020 04:27 pm ----------------------------------------------------------------------  [place] MFM FETAL BPP WO NON STRESS  Result Date: 11/21/2020 ----------------------------------------------------------------------  OBSTETRICS REPORT                       (Signed Final 11/21/2020 11:58 am) ---------------------------------------------------------------------- Patient Info  ID #:       [id number]                          D.O.B.:  1981-06-21 (39 yrs)  Name:       Marisa Campbell             Visit Date: 11/21/2020 09:07 am ---------------------------------------------------------------------- Performed By  Attending:        Ma Rings MD         Ref. Address:     [address]                                                             [address]                                                             [place]                                                             [address]  Performed By:     Marcellina Millin           Secondary Phy.:   [facility]                    RDMS  Care  Referred By:      [facility]             [facility] ---------------------------------------------------------------------- Orders  #  Description                           Code        Ordered By  1  US MFM FETAL BPP WO NON               76819.01    CHARLIE PICKENS     STRESS  2  US MFM MCA DOPPLER                    76821.01    CHARLIE PICKENS  3  US MFM UA CORD DOPPLER                [id number]    CHARLIE PICKENS ----------------------------------------------------------------------  #  Order #                     Accession #                Episode #  1  [id number]                   [id number]                 [id number]  2  [id number]                   [id number]                 [id number]  3  [id number]                   [id number]                 [id number] ---------------------------------------------------------------------- Indications  Advanced maternal age multigravida 4235+,        42O09.523  third trimester (39 yrs)  Maternal care for known or suspected poor      O36.5930  fetal growth, third trimester, not applicable or  unspecified IUGR  [redacted] weeks gestation of pregnancy                Z3A.33  Isoimmunization - Other                        O36.1910  Medical complication of pregnancy              O26.90  (Isoimmunization with prior pregnancies)  Hypothyroid                                    O99.280 E03.9  Previous cesarean delivery, antepartum (x2)    O34.219  Poor obstetric history: Previous preterm       O09.219  delivery, antepartum (36 wks)  Obesity complicating pregnancy, third          [id number]  trimester (BMI 51) ---------------------------------------------------------------------- Fetal Evaluation  Num Of Fetuses:         1  Fetal Heart Rate(bpm):  162  Cardiac Activity:       Observed   Presentation:           Cephalic  Placenta:               Anterior  Amniotic Fluid  AFI FV:      Oligohydramnios  AFI Sum(cm)     %Tile       Largest Pocket(cm)  2.9             < 3         1.8  RUQ(cm)       RLQ(cm)  1.1           1.8 ---------------------------------------------------------------------- Biophysical Evaluation  Amniotic F.V:   Oligohydramnios            F. Tone:        Observed  F. Movement:    Not Observed               Score:          4/8  F. Breathing:   Observed ---------------------------------------------------------------------- OB History  Blood Type:   AB+  Gravidity:    8         Term:   4        Prem:   1        SAB:   1  TOP:          1       Ectopic:  0        Living: 5 ---------------------------------------------------------------------- Gestational Age  LMP:           33w 4d        Date:  03/31/20                 EDD:   01/05/21  Clinical EDD:  32w 4d                                        EDD:   01/12/21  Best:          33w 5d     Det. By:  Marisa Campbell 1st  (06/27/20)    EDD:   01/04/21 ---------------------------------------------------------------------- Anatomy  Stomach:               Appears normal, left   Bladder:                Appears normal                         sided ---------------------------------------------------------------------- Doppler - Fetal Vessels  Umbilical Artery   S/D     %tile                                              ADFV    RDFV    2.6       53  No      No  Middle Cerebral Artery                                                       PSV   MoM                                                     (cm/s)                                                       46.7  < 1 ---------------------------------------------------------------------- Comments  Richrd Sox has been hospitalized due to chronic  hypertension with superimposed severe preeclampsia and an  2 IUGR fetus.  Her pregnancy has also been complicated  by  maternal obesity and isoimmunization.  A biophysical profile performed today was 4 out of 8.  She  received a -2 for an absent amniotic fluid pocket of greater  than 2 cm and another -2 for absent fetal movements.  The total AFI level was only 2.9 cm.  This is a significant drop  from her prior ultrasound exam.  Due to a biophysical profile of 4 out of 8, the patient will be  placed on continuous monitoring.  She will have a repeat  biophysical profile performed tomorrow.  Doppler studies of the umbilical arteries performed today  continues to show normal forward flow with an S/D ratio of  2.6.  There were no signs of absent or reversed end-diastolic  flow noted.  The peak systolic velocity of the middle cerebral artery did  not indicate any signs of fetal anemia.  As the patient would like to attempt a TOLAC and a provider  who is comfortable with letting her attempt a TOL AC with two  prior C-sections is not available until later in the week, she  will be placed on continuous monitoring for now. We will  repeat another biophysical profile tomorrow.  She will receive  a rescue course of antenatal corticosteroids.  Delivery would  be indicated at any time for nonreassuring fetal status. ----------------------------------------------------------------------                   Ma Rings, MD Electronically Signed Final Report   11/21/2020 11:58 am ----------------------------------------------------------------------  [place] MFM MCA DOPPLER  Result Date: 11/21/2020 ----------------------------------------------------------------------  OBSTETRICS REPORT                       (Signed Final 11/21/2020 11:58 am) ---------------------------------------------------------------------- Patient Info  ID #:       [id number]                          D.O.B.:  December 25, 1980 (39 yrs)  Name:       Marisa Campbell             Visit Date: 11/21/2020 09:07 am ----------------------------------------------------------------------  Performed By  Attending:        Ma Rings MD  Ref. Address:     [address]                                                             [address]                                                             [place]                                                             [address]  Performed By:     Marcellina Millin          Secondary Phy.:   [facility]                    RDMS                                                             Care  Referred By:      [facility]             [facility] ---------------------------------------------------------------------- Orders  #  Description                           Code        Ordered By  1  [place] MFM FETAL BPP WO NON               76819.01    CHARLIE PICKENS     STRESS  2  [place] MFM MCA DOPPLER                    76821.01    CHARLIE PICKENS  3  [place] MFM UA CORD DOPPLER                84665.99    CHARLIE PICKENS ----------------------------------------------------------------------  #  Order #  Accession #                Episode #  1  [id number]                   [id number]                 [id number]  2  [id number]                   [id number]                 [id number]  3  [id number]                   [id number]                 [id number] ---------------------------------------------------------------------- Indications  Advanced maternal age multigravida 46+,        O42.523  third trimester (39 yrs)  Maternal care for known or suspected poor      O36.5930  fetal growth, third trimester, not applicable or  unspecified IUGR  [redacted] weeks gestation of pregnancy                Z3A.33  Isoimmunization - Other                        O36.1910  Medical complication of pregnancy              O26.90  (Isoimmunization with prior pregnancies)  Hypothyroid                                    O99.280  E03.9  Previous cesarean delivery, antepartum (x2)    O34.219  Poor obstetric history: Previous preterm       O09.219  delivery, antepartum (36 wks)  Obesity complicating pregnancy, third          O99.213  trimester (BMI 51) ---------------------------------------------------------------------- Fetal Evaluation  Num Of Fetuses:         1  Fetal Heart Rate(bpm):  162  Cardiac Activity:       Observed  Presentation:           Cephalic  Placenta:               Anterior  Amniotic Fluid  AFI FV:      Oligohydramnios  AFI Sum(cm)     %Tile       Largest Pocket(cm)  2.9             < 3         1.8  RUQ(cm)       RLQ(cm)  1.1           1.8 ---------------------------------------------------------------------- Biophysical Evaluation  Amniotic F.V:   Oligohydramnios            F. Tone:        Observed  F. Movement:    Not Observed               Score:          4/8  F. Breathing:   Observed ---------------------------------------------------------------------- OB History  Blood Type:   AB+  Gravidity:    8         Term:   4        Prem:   1        SAB:  1  TOP:          1       Ectopic:  0        Living: 5 ---------------------------------------------------------------------- Gestational Age  LMP:           33w 4d        Date:  03/31/20                 EDD:   01/05/21  Clinical EDD:  32w 4d                                        EDD:   01/12/21  Best:          33w 5d     Det. By:  Marisa Campbell 1st  (06/27/20)    EDD:   01/04/21 ---------------------------------------------------------------------- Anatomy  Stomach:               Appears normal, left   Bladder:                Appears normal                         sided ---------------------------------------------------------------------- Doppler - Fetal Vessels  Umbilical Artery   S/D     %tile                                              ADFV    RDFV    2.6       53                                                 No      No  Middle Cerebral Artery                                                        PSV   MoM                                                     (cm/s)                                                       46.7  < 1 ---------------------------------------------------------------------- Comments  Richrd Sox has been hospitalized due to chronic  hypertension with superimposed severe preeclampsia and an  2 IUGR fetus.  Her pregnancy has also been complicated by  maternal obesity and isoimmunization.  A biophysical profile performed today was 4 out of 8.  She  received a -2 for an absent amniotic fluid pocket of greater  than 2 cm and another -2  for absent fetal movements.  The total AFI level was only 2.9 cm.  This is a significant drop  from her prior ultrasound exam.  Due to a biophysical profile of 4 out of 8, the patient will be  placed on continuous monitoring.  She will have a repeat  biophysical profile performed tomorrow.  Doppler studies of the umbilical arteries performed today  continues to show normal forward flow with an S/D ratio of  2.6.  There were no signs of absent or reversed end-diastolic  flow noted.  The peak systolic velocity of the middle cerebral artery did  not indicate any signs of fetal anemia.  As the patient would like to attempt a TOLAC and a provider  who is comfortable with letting her attempt a TOL AC with two  prior C-sections is not available until later in the week, she  will be placed on continuous monitoring for now. We will  repeat another biophysical profile tomorrow.  She will receive  a rescue course of antenatal corticosteroids.  Delivery would  be indicated at any time for nonreassuring fetal status. ----------------------------------------------------------------------                   Ma Rings, MD Electronically Signed Final Report   11/21/2020 11:58 am ----------------------------------------------------------------------  [place] MFM UA CORD DOPPLER  Result Date:  11/21/2020 ----------------------------------------------------------------------  OBSTETRICS REPORT                       (Signed Final 11/21/2020 11:58 am) ---------------------------------------------------------------------- Patient Info  ID #:       [id number]                          D.O.B.:  1980/08/23 (39 yrs)  Name:       Marisa Campbell             Visit Date: 11/21/2020 09:07 am ---------------------------------------------------------------------- Performed By  Attending:        Ma Rings MD         Ref. Address:     [address]                                                             [address]                                                             [place]  [address]  Performed By:     Marcellina Millin          Secondary Phy.:   [facility]                    RDMS                                                             Care  Referred By:      [facility]             [facility] ---------------------------------------------------------------------- Orders  #  Description                           Code        Ordered By  1  [place] MFM FETAL BPP WO NON               76819.01    CHARLIE PICKENS     STRESS  2  [place] MFM MCA DOPPLER                    76821.01    CHARLIE PICKENS  3  [place] MFM UA CORD DOPPLER                13086.57    CHARLIE PICKENS ----------------------------------------------------------------------  #  Order #                     Accession #                Episode #  1  [id number]                   [id number]                 [id number]  2  [id number]                   [id number]                 [id number]  3  [id number]                   [id number]                 [id number] ---------------------------------------------------------------------- Indications  Advanced  maternal age multigravida 37+,        O38.523  third trimester (39 yrs)  Maternal care for known or suspected poor      O36.5930  fetal growth, third trimester, not applicable or  unspecified IUGR  [redacted] weeks gestation of pregnancy                Z3A.33  Isoimmunization - Other  X10.6269  Medical complication of pregnancy              O26.90  (Isoimmunization with prior pregnancies)  Hypothyroid                                    O99.280 E03.9  Previous cesarean delivery, antepartum (x2)    O34.219  Poor obstetric history: Previous preterm       O09.219  delivery, antepartum (36 wks)  Obesity complicating pregnancy, third          O99.213  trimester (BMI 51) ---------------------------------------------------------------------- Fetal Evaluation  Num Of Fetuses:         1  Fetal Heart Rate(bpm):  162  Cardiac Activity:       Observed  Presentation:           Cephalic  Placenta:               Anterior  Amniotic Fluid  AFI FV:      Oligohydramnios  AFI Sum(cm)     %Tile       Largest Pocket(cm)  2.9             < 3         1.8  RUQ(cm)       RLQ(cm)  1.1           1.8 ---------------------------------------------------------------------- Biophysical Evaluation  Amniotic F.V:   Oligohydramnios            F. Tone:        Observed  F. Movement:    Not Observed               Score:          4/8  F. Breathing:   Observed ---------------------------------------------------------------------- OB History  Blood Type:   AB+  Gravidity:    8         Term:   4        Prem:   1        SAB:   1  TOP:          1       Ectopic:  0        Living: 5 ---------------------------------------------------------------------- Gestational Age  LMP:           33w 4d        Date:  03/31/20                 EDD:   01/05/21  Clinical EDD:  32w 4d                                        EDD:   01/12/21  Best:          33w 5d     Det. By:  Marisa Campbell 1st  (06/27/20)    EDD:   01/04/21  ---------------------------------------------------------------------- Anatomy  Stomach:               Appears normal, left   Bladder:                Appears normal                         sided ---------------------------------------------------------------------- Doppler - Fetal Vessels  Umbilical Artery  S/D     %tile                                              ADFV    RDFV    2.6       53                                                 No      No  Middle Cerebral Artery                                                       PSV   MoM                                                     (cm/s)                                                       46.7  < 1 ---------------------------------------------------------------------- Comments  Richrd Sox has been hospitalized due to chronic  hypertension with superimposed severe preeclampsia and an  2 IUGR fetus.  Her pregnancy has also been complicated by  maternal obesity and isoimmunization.  A biophysical profile performed today was 4 out of 8.  She  received a -2 for an absent amniotic fluid pocket of greater  than 2 cm and another -2 for absent fetal movements.  The total AFI level was only 2.9 cm.  This is a significant drop  from her prior ultrasound exam.  Due to a biophysical profile of 4 out of 8, the patient will be  placed on continuous monitoring.  She will have a repeat  biophysical profile performed tomorrow.  Doppler studies of the umbilical arteries performed today  continues to show normal forward flow with an S/D ratio of  2.6.  There were no signs of absent or reversed end-diastolic  flow noted.  The peak systolic velocity of the middle cerebral artery did  not indicate any signs of fetal anemia.  As the patient would like to attempt a TOLAC and a provider  who is comfortable with letting her attempt a TOL AC with two  prior C-sections is not available until later in the week, she  will be placed on continuous monitoring for now. We will   repeat another biophysical profile tomorrow.  She will receive  a rescue course of antenatal corticosteroids.  Delivery would  be indicated at any time for nonreassuring fetal status. ----------------------------------------------------------------------                   Ma Rings, MD Electronically Signed Final Report   11/21/2020 11:58 am ----------------------------------------------------------------------    Medications:  Scheduled . aspirin EC  81 mg Oral Daily  .  docusate sodium  100 mg Oral Daily  . famotidine  20 mg Oral Daily  . insulin aspart  0-14 Units Subcutaneous TID PC  . insulin aspart  0-14 Units Subcutaneous QAC breakfast  . levothyroxine  137 mcg Oral QAC breakfast  . NIFEdipine  30 mg Oral BID  . prenatal multivitamin  1 tablet Oral Q1200  . sodium chloride flush  3 mL Intravenous Q12H  . valACYclovir  500 mg Oral BID   I have reviewed the patient's current medications.  ASSESSMENT: [id number] [redacted]w[redacted]d Estimated Date of Delivery: 01/04/21  Admitted due to cHTN with superimposed preeclampsia  PLAN: 1) Severe preeclampsia -BP stable on Procardia 30mg  bid -s/p Mag x 24hr, plan to restart with induction -currently asymptomatic, labs stable q 72hr  2)GDMA1- no scheduled medication CBG mostly controlled sliding scale as needed  3) Fetal well being -NICHD- Cat. I -continue NST q shift -continue BPP twice weekly, last completed on 5/18  4) Alloisoimmunization -anti-C and anti-E  5) h/o prior C-section and VBAC -Patient desiresTOLAC. Discussed IOL with Foley balloon- to be started midnight 5/20  6) Maternal well being -BMI 50s- ambulation as tolerated, SCDs while in bed -GBS positive: treatment in labor  DISPO: Plan to transition either late this evening/early tomorrow morning to L&D for induction.  Plan for nipple stim/Foley balloon.  Also plan to start PCN due to GBS positive and Mag with start of IOL.  Plan reviewed with patient.  6/20,  DO Attending Obstetrician & Gynecologist, [facility] for [facility]

## 2020-11-23 NOTE — Progress Notes (Signed)
    Faculty Practice OB/GYN Attending Note  Subjective:  Patient will be going to L&D soon for scheduled IOL.  Patient denies any headaches, visual symptoms, RUQ/epigastric pain or other concerning symptoms.  FHR reassuring, no contractions, no LOF or vaginal bleeding. Good FM.   Admitted on 11/10/2020 for Chronic hypertension with superimposed severe preeclampsia.    Objective:  Blood pressure 130/72, pulse 68, temperature 98.6 F (37 C), temperature source Oral, resp. rate 18, last menstrual period 03/30/2020, SpO2 98 %, currently breastfeeding. FHT  Baseline 145 bpm, moderate variability, +accelerations, mild variable decelerations Toco:none Gen: NAD HENT: Normocephalic, atraumatic Lungs: Normal respiratory effort Heart: Regular rate noted Abdomen: NT, gravid fundus, soft Cervix: Deferred Ext: 2+ DTRs, no edema, no cyanosis, negative Homan's sign  Assessment & Plan:  40 y.o. [id number] at [redacted]w[redacted]d to be transferred to L&D for IOL for CHTN with superimposed severe PEC. - Transfer orders placed including magnesium sulfate eclampsia prophylaxis - Patient desires TOLAC. History of PLTCS>VBAC x3>RLTCS in 2020 for breech.  Counseled regarding TOLAC vs RCS; risks/benefits discussed in detail including increased risk of uterine rupture with two previous cesarean sections and induction. All questions answered.  Patient elects for TOLAC, consent signed 11/23/2020. - Patient desires to do nipple stimulation in addition to foley bulb placement for cervical ripening - Will monitor closely - To L&D soon   Jaynie Collins, MD, FACOG Obstetrician & Gynecologist, [facility] for [employer], [facility]

## 2020-11-23 NOTE — Plan of Care (Signed)
  Problem: Elimination: Goal: Will not experience complications related to bowel motility Outcome: Completed/Met   Problem: Clinical Measurements: Goal: Complications related to the disease process, condition or treatment will be avoided or minimized Outcome: Completed/Met

## 2020-11-24 ENCOUNTER — Inpatient Hospital Stay (HOSPITAL_COMMUNITY)
Admission: AD | Admit: 2020-11-24 | Payer: Medicaid Other | Source: Home / Self Care | Admitting: Obstetrics & Gynecology

## 2020-11-24 ENCOUNTER — Encounter (HOSPITAL_COMMUNITY): Payer: Self-pay | Admitting: Obstetrics & Gynecology

## 2020-11-24 ENCOUNTER — Encounter (HOSPITAL_COMMUNITY): Payer: Self-pay | Admitting: Anesthesiology

## 2020-11-24 ENCOUNTER — Inpatient Hospital Stay (HOSPITAL_COMMUNITY): Payer: Medicaid Other

## 2020-11-24 ENCOUNTER — Encounter: Payer: Medicaid Other | Admitting: Obstetrics

## 2020-11-24 DIAGNOSIS — O1092 Unspecified pre-existing hypertension complicating childbirth: Secondary | ICD-10-CM | POA: Diagnosis not present

## 2020-11-24 DIAGNOSIS — O114 Pre-existing hypertension with pre-eclampsia, complicating childbirth: Secondary | ICD-10-CM | POA: Diagnosis not present

## 2020-11-24 DIAGNOSIS — O24420 Gestational diabetes mellitus in childbirth, diet controlled: Secondary | ICD-10-CM

## 2020-11-24 LAB — COMPREHENSIVE METABOLIC PANEL
ALT: 22 U/L (ref 0–44)
ALT: 23 U/L (ref 0–44)
ALT: 30 U/L (ref 0–44)
AST: 18 U/L (ref 15–41)
AST: 29 U/L (ref 15–41)
AST: 28 U/L (ref 15–41)
Albumin: 2.6 g/dL — ABNORMAL LOW (ref 3.5–5.0)
Albumin: 2.6 g/dL — ABNORMAL LOW (ref 3.5–5.0)
Albumin: 2.6 g/dL — ABNORMAL LOW (ref 3.5–5.0)
Alkaline Phosphatase: 74 U/L (ref 38–126)
Alkaline Phosphatase: 89 U/L (ref 38–126)
Alkaline Phosphatase: 100 U/L (ref 38–126)
Anion gap: 6 (ref 5–15)
Anion gap: 5 (ref 5–15)
Anion gap: 6 (ref 5–15)
BUN: 22 mg/dL — ABNORMAL HIGH (ref 6–20)
BUN: 16 mg/dL (ref 6–20)
BUN: 11 mg/dL (ref 6–20)
CO2: 21 mmol/L — ABNORMAL LOW (ref 22–32)
CO2: 23 mmol/L (ref 22–32)
CO2: 22 mmol/L (ref 22–32)
Calcium: 8.6 mg/dL — ABNORMAL LOW (ref 8.9–10.3)
Calcium: 7.7 mg/dL — ABNORMAL LOW (ref 8.9–10.3)
Calcium: 6.8 mg/dL — ABNORMAL LOW (ref 8.9–10.3)
Chloride: 106 mmol/L (ref 98–111)
Chloride: 103 mmol/L (ref 98–111)
Chloride: 101 mmol/L (ref 98–111)
Creatinine, Ser: 0.67 mg/dL (ref 0.44–1.00)
Creatinine, Ser: 0.68 mg/dL (ref 0.44–1.00)
Creatinine, Ser: 0.73 mg/dL (ref 0.44–1.00)
GFR, Estimated: >60 mL/min (ref >60)
GFR, Estimated: >60 mL/min (ref >60)
GFR, Estimated: >60 mL/min (ref >60)
Glucose, Bld: 95 mg/dL (ref 70–99)
Glucose, Bld: 92 mg/dL (ref 70–99)
Glucose, Bld: 102 mg/dL — ABNORMAL HIGH (ref 70–99)
Potassium: 4.2 mmol/L (ref 3.5–5.1)
Potassium: 4.4 mmol/L (ref 3.5–5.1)
Potassium: 4 mmol/L (ref 3.5–5.1)
Sodium: 133 mmol/L — ABNORMAL LOW (ref 135–145)
Sodium: 131 mmol/L — ABNORMAL LOW (ref 135–145)
Sodium: 129 mmol/L — ABNORMAL LOW (ref 135–145)
Total Bilirubin: 0.5 mg/dL (ref 0.3–1.2)
Total Bilirubin: 0.4 mg/dL (ref 0.3–1.2)
Total Bilirubin: 0.5 mg/dL (ref 0.3–1.2)
Total Protein: 5.6 g/dL — ABNORMAL LOW (ref 6.5–8.1)
Total Protein: 5.8 g/dL — ABNORMAL LOW (ref 6.5–8.1)
Total Protein: 5.9 g/dL — ABNORMAL LOW (ref 6.5–8.1)

## 2020-11-24 LAB — CBC
HCT: 34.1 % — ABNORMAL LOW (ref 36.0–46.0)
HCT: 34.8 % — ABNORMAL LOW (ref 36.0–46.0)
HCT: 34.6 % — ABNORMAL LOW (ref 36.0–46.0)
Hemoglobin: 11.8 g/dL — ABNORMAL LOW (ref 12.0–15.0)
Hemoglobin: 12.3 g/dL (ref 12.0–15.0)
Hemoglobin: 12.1 g/dL (ref 12.0–15.0)
MCH: 29.8 pg (ref 26.0–34.0)
MCH: 30.4 pg (ref 26.0–34.0)
MCH: 30.1 pg (ref 26.0–34.0)
MCHC: 34.6 g/dL (ref 30.0–36.0)
MCHC: 35.3 g/dL (ref 30.0–36.0)
MCHC: 35 g/dL (ref 30.0–36.0)
MCV: 86.1 fL (ref 80.0–100.0)
MCV: 85.9 fL (ref 80.0–100.0)
MCV: 86.1 fL (ref 80.0–100.0)
Platelets: 190 10*3/uL (ref 150–400)
Platelets: 186 10*3/uL (ref 150–400)
Platelets: 190 10*3/uL (ref 150–400)
RBC: 3.96 MIL/uL (ref 3.87–5.11)
RBC: 4.05 MIL/uL (ref 3.87–5.11)
RBC: 4.02 MIL/uL (ref 3.87–5.11)
RDW: 13.3 % (ref 11.5–15.5)
RDW: 13.3 % (ref 11.5–15.5)
RDW: 13.3 % (ref 11.5–15.5)
WBC: 13.3 10*3/uL — ABNORMAL HIGH (ref 4.0–10.5)
WBC: 14.9 10*3/uL — ABNORMAL HIGH (ref 4.0–10.5)
WBC: 14.2 10*3/uL — ABNORMAL HIGH (ref 4.0–10.5)
nRBC: 0 % (ref 0.0–0.2)
nRBC: 0 % (ref 0.0–0.2)
nRBC: 0 % (ref 0.0–0.2)

## 2020-11-24 LAB — GLUCOSE, CAPILLARY
Glucose-Capillary: 104 mg/dL — ABNORMAL HIGH (ref 70–99)
Glucose-Capillary: 106 mg/dL — ABNORMAL HIGH (ref 70–99)
Glucose-Capillary: 112 mg/dL — ABNORMAL HIGH (ref 70–99)
Glucose-Capillary: 79 mg/dL (ref 70–99)
Glucose-Capillary: 92 mg/dL (ref 70–99)
Glucose-Capillary: 99 mg/dL (ref 70–99)

## 2020-11-24 LAB — RPR: RPR Ser Ql: NONREACTIVE

## 2020-11-24 MED ORDER — TERBUTALINE SULFATE 1 MG/ML IJ SOLN
INTRAMUSCULAR | Status: AC
  Filled 2020-11-24: qty 1

## 2020-11-24 MED ORDER — EPHEDRINE 5 MG/ML INJ: 10.0000 mg | INTRAVENOUS | Status: DC | PRN

## 2020-11-24 MED ORDER — PHENYLEPHRINE 40 MCG/ML (10ML) SYRINGE FOR IV PUSH (FOR BLOOD PRESSURE SUPPORT)
80.0000 ug | PREFILLED_SYRINGE | INTRAVENOUS | Status: DC | PRN
  Administered 2020-11-24: 80 ug via INTRAVENOUS

## 2020-11-24 MED ORDER — TRANEXAMIC ACID 1000 MG/10ML IV SOLN: 1.5000 mg/kg/h | INTRAVENOUS | Status: DC

## 2020-11-24 MED ORDER — LACTATED RINGERS AMNIOINFUSION
INTRAVENOUS | Status: DC
Start: 2020-11-24 — End: 2020-11-25
  Filled 2020-11-24 (×3): qty 1000

## 2020-11-24 MED ORDER — DIPHENHYDRAMINE HCL 50 MG/ML IJ SOLN: 12.5000 mg | INTRAMUSCULAR | Status: DC | PRN

## 2020-11-24 MED ORDER — TRANEXAMIC ACID-NACL 1000-0.7 MG/100ML-% IV SOLN: 1000.0000 mg | Freq: Once | INTRAVENOUS | Status: AC

## 2020-11-24 MED ORDER — LACTATED RINGERS IV SOLN: 500.0000 mL | Freq: Once | INTRAVENOUS | Status: DC

## 2020-11-24 MED ORDER — PHENYLEPHRINE 40 MCG/ML (10ML) SYRINGE FOR IV PUSH (FOR BLOOD PRESSURE SUPPORT)
80.0000 ug | PREFILLED_SYRINGE | INTRAVENOUS | Status: DC | PRN
  Filled 2020-11-24: qty 10

## 2020-11-24 MED ORDER — TRANEXAMIC ACID-NACL 1000-0.7 MG/100ML-% IV SOLN
INTRAVENOUS | Status: AC
  Administered 2020-11-24: 1000 mg via INTRAVENOUS
  Filled 2020-11-24: qty 100

## 2020-11-24 MED ORDER — FENTANYL-BUPIVACAINE-NACL 0.5-0.125-0.9 MG/250ML-% EP SOLN
12.0000 mL/h | EPIDURAL | Status: DC | PRN
  Filled 2020-11-24: qty 250

## 2020-11-24 NOTE — Anesthesia Preprocedure Evaluation (Deleted)
Anesthesia Evaluation  Patient identified by MRN, date of birth, ID band Patient awake    Reviewed: Allergy & Precautions, Patient's Chart, lab work & pertinent test results, reviewed documented beta blocker date and time   History of Anesthesia Complications Negative for: history of anesthetic complications  Airway        Dental   Pulmonary neg pulmonary ROS,           Cardiovascular hypertension (Severe preE), Pt. on home beta blockers and Pt. on medications      Neuro/Psych  Headaches, negative psych ROS   GI/Hepatic negative GI ROS, Neg liver ROS,   Endo/Other  diabetes, GestationalHypothyroidism Morbid obesity (BMI 57)  Renal/GU negative Renal ROS  negative genitourinary   Musculoskeletal negative musculoskeletal ROS (+)   Abdominal   Peds  Hematology negative hematology ROS (+)   Anesthesia Other Findings Day of surgery medications reviewed with patient.  Reproductive/Obstetrics (+) Pregnancy (Hx of C/S x2 (C/S-->VBACx3-->C/S))                             Anesthesia Physical Anesthesia Plan  ASA: III  Anesthesia Plan: Epidural   Post-op Pain Management:    Induction:   PONV Risk Score and Plan: Treatment may vary due to age or medical condition  Airway Management Planned: Natural Airway  Additional Equipment:   Intra-op Plan:   Post-operative Plan:   Informed Consent: I have reviewed the patients History and Physical, chart, labs and discussed the procedure including the risks, benefits and alternatives for the proposed anesthesia with the patient or authorized representative who has indicated his/her understanding and acceptance.       Plan Discussed with:   Anesthesia Plan Comments:         Anesthesia Quick Evaluation

## 2020-11-24 NOTE — Progress Notes (Addendum)
Marisa Campbell is a 40 y.o. [id number] at [redacted]w[redacted]d admitted for induction of labor due to chronic hypertension now with superimposed pre-e.  Subjective: Coping well Does not want epidural at this time. Objective: BP (!) 144/80   Pulse 62   Temp 98 F (36.7 C) (Oral)   Resp 16   LMP 03/30/2020   SpO2 97%  Total I/O In: 1579.7 [P.O.:1080; I.V.:399.7; IV Piggyback:100] Out: 700 [Urine:700]  FHR baseline 135 bpm, Variability: moderate, Accelerations:present, Decelerations: Absent Toco: patient hardly feeling contractions   SVE:   Dilation: 5 Effacement (%): 60 Station: -2 Exam by:: Marisa Campbell, CNM  Pitocin @ 5 mu/min  Labs: Lab Results  Component Value Date   WBC 14.9 (H) 11/24/2020   HGB 12.3 11/24/2020   HCT 34.8 (L) 11/24/2020   MCV 85.9 11/24/2020   PLT 186 11/24/2020    Assessment / Plan: Patient feeling more comfortable; station still feels high, cervix is still thick.   At this time, too high to AROM. Dr. Alysia Campbell agrees with waiting on AROM, continue to titrate pitocin.  Will try repositioning and peanut ball.    Labor: early Fetal Wellbeing:  Category I Pain Control:  labor support without medications Pre-eclampsia: on mag; she denies any headache, other s/s of pre-eclampsia I/D:  PCN for GBS+ Anticipated MOD: NSVB  Marisa Campbell Marisa Campbell CNM 11/24/2020, 10:59 AM

## 2020-11-24 NOTE — Progress Notes (Signed)
Marisa Campbell is a 40 y.o. [id number] at [redacted]w[redacted]d admitted for induction of labor due to [facility] with SIPE. Her headache is improving.  She had a headache this morning and Blood sugar was 79, she now feels better after some juice.   Subjective: feeling contractions some, but not uncomfortable  Objective: BP 136/81   Pulse 64   Temp 98 F (36.7 C) (Oral)   Resp 16   LMP 03/30/2020   SpO2 97%  Total I/O In: 1124.4 [P.O.:840; I.V.:184.4; IV Piggyback:100] Out: 400 [Urine:400]  FHR baseline 140 bpm, Variability: moderate, Accelerations:present, Decelerations:  Absent Toco: none   SVE:   Dilation: 5.5 Effacement (%): 60 Station: -2 Exam by:: Earlene Plater, RN  Pitocin @ 3  mu/min  Labs: Lab Results  Component Value Date   WBC 13.3 (H) 11/24/2020   HGB 11.8 (L) 11/24/2020   HCT 34.1 (L) 11/24/2020   MCV 86.1 11/24/2020   PLT 190 11/24/2020    Assessment / Plan: IOL d/t preeclampsia  and n/a, s/p foley bulb (out at 7am). Pitocin: currently at 3 mu/min.  Will check and AROM if fetal head is engaged.  Labor: early Fetal Wellbeing:  Category I Pain Control:  labor support without medications Pre-eclampsia: BPs 140/150 over 80s overnight; patient has HA but thinks it could be due to low blood sugar.  Will give morning procardia.  I/D:  PCN for GBS+ Anticipated MOD: hopeful for vaginal birth  [name] CNM, 11/24/2020, 9:10 AM

## 2020-11-24 NOTE — Discharge Summary (Signed)
     Postpartum Discharge Summary  Date of Service  See other copy of note

## 2020-11-24 NOTE — Lactation Note (Signed)
This note was copied from a baby's chart. Lactation Consultation Note  Patient Name: Marisa Campbell [id number] Date: 11/24/2020   Age:41 hours  Attempted L&D visit but baby had already been transferred to the NICU for observation due to prematurity. Baby was born at 76 1/7. Mom aware that she'll be set up with DEBP due to infant separation and that she'll need to pump at least 8 times/24 hours, every 3 hours.   Mom agreeable with plan, she's a P6 and familiar with BF and pumping, but this is the first time she has a baby born this early. Lactation will follow up with her later on once she gets transferred to her room at [facility].  Maternal Data    Feeding    LATCH Score                    Lactation Tools Discussed/Used    Interventions    Discharge    Consult Status      Marisa Campbell 11/24/2020, 10:57 PM

## 2020-11-24 NOTE — Progress Notes (Signed)
Patient ID: Marisa Campbell, female   DOB: 28-Mar-1981, 40 y.o.   MRN: [id number]  Feels like ctx are a bit stronger now  BPs 159/82, 143/69, P 66 FHR 140s, +accels, early varibles Ctx q 2-3 mins with Pit @ 22mu/min Cx unchanged (6/70/vtx -2 with R hand/wrist on top of head)  IUP@34 .1wks cHTN w SIPE GDMA1 IUGR Hx C/S x 2 IOL process  IUPC inserted to facilitate Pitocin gtt Hopeful for VBAC  Arabella Merles [facility] 11/24/2020 6:44 PM

## 2020-11-24 NOTE — Progress Notes (Signed)
Patient Vitals for the past 4 hrs:  BP Pulse Resp  11/24/20 0705 (!) 153/79 63 20  11/24/20 0614 (!) 153/86 75 --  11/24/20 0555 (!) 160/84 65 --  11/24/20 0503 (!) 158/89 64 17   Foley fell out, ctx decreasing in frequency. FHR Cat 1.  Pt requests Pitocin instead of nipple stimulation (tired). Will start at 35mu/min and increase by 1 mu/min.  Continue Mg+

## 2020-11-24 NOTE — Progress Notes (Signed)
Marisa Campbell is a 40 y.o. [id number] at [redacted]w[redacted]d admitted for induction of labor due to chronic HTN with super imposed pre-e, GDMA1 diet controlled. AFI of 5  Subjective: feeling contractions some, but not uncomfortable; rating her pain a 4.   Objective: BP (!) 158/94   Pulse 65   Temp 98.1 F (36.7 C) (Oral)   Resp 16   LMP 03/30/2020   SpO2 97%  Total I/O In: 2344.5 [P.O.:1320; I.V.:824.5; IV Piggyback:200] Out: 1850 [Urine:1850]  FHR baseline 135 bpm, Variability: moderate, Accelerations:present, Decelerations:  Absent Toco: irregular, q 5 min per patients   SVE:   Dilation: 5 Effacement (%): 60 Station: -2 Exam by:: Alexandr Yaworski, CNM  Pitocin @ 11 mu/min  Labs: Lab Results  Component Value Date   WBC 14.9 (H) 11/24/2020   HGB 12.3 11/24/2020   HCT 34.8 (L) 11/24/2020   MCV 85.9 11/24/2020   PLT 186 11/24/2020    Assessment / Plan: recheck and patient is still the same, thick cervix, middle position.  -2 station.  -Will discuss AROM now with Dr. Alysia Penna, patient is amenable.   Labor: early Fetal Wellbeing:  Category I Pain Control:  labor support without medications Pre-eclampsia: VSS, patient had one severe range BP during contraction but overall 150s/90s; has not required IV meds today or insulin today.   I/D:  PCN for GBS+;  Anticipated MOD: NSVB  Charlesetta Garibaldi Leonce Bale CNM, 11/24/2020, 2:28 PM

## 2020-11-24 NOTE — Progress Notes (Signed)
Marisa Campbell is a 40 y.o. [id number] at [redacted]w[redacted]d admitted for induction of labor due to Chronic HTN now with SIPE. [place] with AFI of 5.  Subjective: feeling contractions some, but not uncomfortable;   Objective: BP (!) 153/91   Pulse 62   Temp 98.1 F (36.7 C) (Oral)   Resp 16   LMP 03/30/2020   SpO2 97%  Total I/O In: 2344.5 [P.O.:1320; I.V.:824.5; IV Piggyback:200] Out: 1850 [Urine:1850]  FHR baseline 130 bpm, Variability: moderate, Accelerations:not present, Decelerations:  Absent Toco: irregular, every 5 minutes   SVE:   Dilation: 5 Effacement (%): 60 Station: -2 Exam by:: Sarrinah Gardin, CNM  Pitocin @ 13 mu/min  Labs: Lab Results  Component Value Date   WBC 14.9 (H) 11/24/2020   HGB 12.3 11/24/2020   HCT 34.8 (L) 11/24/2020   MCV 85.9 11/24/2020   PLT 186 11/24/2020    Assessment / Plan: Dr. Alysia Penna at bedside to AROM patient. AROM successful with moderate amount of clear fluid.   Labor: still at 5 cm, s/p AROM with clear fluid.  Fetal Wellbeing:  Category I Pain Control:  labor support without medications Pre-eclampsia: bp's stable; no symptoms, still on MgSo4 I/D:  PCN for GBS+ Anticipated MOD: NSVB  [name] CNM, 11/24/2020, 3:58 PM

## 2020-11-25 LAB — GLUCOSE, CAPILLARY
Glucose-Capillary: 88 mg/dL (ref 70–99)
Glucose-Capillary: 81 mg/dL (ref 70–99)
Glucose-Capillary: 101 mg/dL — ABNORMAL HIGH (ref 70–99)

## 2020-11-25 MED ORDER — ONDANSETRON HCL 4 MG PO TABS: 4.0000 mg | ORAL_TABLET | ORAL | Status: DC | PRN

## 2020-11-25 MED ORDER — SENNOSIDES-DOCUSATE SODIUM 8.6-50 MG PO TABS
2.0000 | ORAL_TABLET | ORAL | Status: DC
  Administered 2020-11-25: 2 via ORAL
  Filled 2020-11-25: qty 2

## 2020-11-25 MED ORDER — PRENATAL MULTIVITAMIN CH
1.0000 | ORAL_TABLET | Freq: Every day | ORAL | Status: DC
  Administered 2020-11-25 – 2020-11-26 (×2): 1 via ORAL
  Filled 2020-11-25 (×2): qty 1

## 2020-11-25 MED ORDER — NIFEDIPINE ER OSMOTIC RELEASE 30 MG PO TB24
30.0000 mg | ORAL_TABLET | Freq: Every day | ORAL | Status: DC
  Administered 2020-11-25: 30 mg via ORAL
  Filled 2020-11-25: qty 1

## 2020-11-25 MED ORDER — COCONUT OIL OIL: 1.0000 "application " | TOPICAL_OIL | Status: DC | PRN

## 2020-11-25 MED ORDER — LABETALOL HCL 5 MG/ML IV SOLN: 20.0000 mg | INTRAVENOUS | Status: DC | PRN

## 2020-11-25 MED ORDER — WITCH HAZEL-GLYCERIN EX PADS: 1.0000 "application " | MEDICATED_PAD | CUTANEOUS | Status: DC | PRN

## 2020-11-25 MED ORDER — MAGNESIUM SULFATE 40 GM/1000ML IV SOLN
2.0000 g/h | INTRAVENOUS | Status: AC
  Administered 2020-11-25: 2 g/h via INTRAVENOUS
  Filled 2020-11-25: qty 1000

## 2020-11-25 MED ORDER — LABETALOL HCL 5 MG/ML IV SOLN: 40.0000 mg | INTRAVENOUS | Status: DC | PRN

## 2020-11-25 MED ORDER — DOCUSATE SODIUM 100 MG PO CAPS
100.0000 mg | ORAL_CAPSULE | Freq: Two times a day (BID) | ORAL | Status: DC
  Administered 2020-11-25 – 2020-11-27 (×5): 100 mg via ORAL
  Filled 2020-11-25 (×5): qty 1

## 2020-11-25 MED ORDER — HYDRALAZINE HCL 20 MG/ML IJ SOLN: 10.0000 mg | INTRAMUSCULAR | Status: DC | PRN

## 2020-11-25 MED ORDER — IBUPROFEN 600 MG PO TABS
600.0000 mg | ORAL_TABLET | Freq: Four times a day (QID) | ORAL | Status: DC
  Administered 2020-11-25 – 2020-11-27 (×9): 600 mg via ORAL
  Filled 2020-11-25 (×9): qty 1

## 2020-11-25 MED ORDER — ACETAMINOPHEN 325 MG PO TABS: 650.0000 mg | ORAL_TABLET | ORAL | Status: DC | PRN

## 2020-11-25 MED ORDER — DIPHENHYDRAMINE HCL 25 MG PO CAPS: 25.0000 mg | ORAL_CAPSULE | Freq: Four times a day (QID) | ORAL | Status: DC | PRN

## 2020-11-25 MED ORDER — ONDANSETRON HCL 4 MG/2ML IJ SOLN: 4.0000 mg | INTRAMUSCULAR | Status: DC | PRN

## 2020-11-25 MED ORDER — SIMETHICONE 80 MG PO CHEW: 80.0000 mg | CHEWABLE_TABLET | ORAL | Status: DC | PRN

## 2020-11-25 MED ORDER — LACTATED RINGERS IV SOLN: INTRAVENOUS | Status: DC

## 2020-11-25 MED ORDER — TETANUS-DIPHTH-ACELL PERTUSSIS 5-2.5-18.5 LF-MCG/0.5 IM SUSY: 0.5000 mL | PREFILLED_SYRINGE | Freq: Once | INTRAMUSCULAR | Status: DC

## 2020-11-25 MED ORDER — DIBUCAINE (PERIANAL) 1 % EX OINT
1.0000 | TOPICAL_OINTMENT | CUTANEOUS | Status: DC | PRN
Start: 2020-11-24 — End: 2020-11-27

## 2020-11-25 MED ORDER — LABETALOL HCL 5 MG/ML IV SOLN: 80.0000 mg | INTRAVENOUS | Status: DC | PRN

## 2020-11-25 MED ORDER — BENZOCAINE-MENTHOL 20-0.5 % EX AERO: 1.0000 "application " | INHALATION_SPRAY | CUTANEOUS | Status: DC | PRN

## 2020-11-25 NOTE — Lactation Note (Signed)
This note was copied from a baby's chart. Lactation Consultation Note Spoke w/RN. RN stated mom has DEBP set up in rm. Mom stated she felt so bad she wasn't going to pump tonight she will pump in the morning when feeling better.  Patient Name: Girl Dulce Martian WCHEN'I Date: 11/25/2020   Age:41 hours  Maternal Data    Feeding    LATCH Score                    Lactation Tools Discussed/Used    Interventions    Discharge    Consult Status      [name] 11/25/2020, 4:27 AM

## 2020-11-25 NOTE — Lactation Note (Signed)
This note was copied from a baby's chart. Lactation Consultation Note Attempted to see mom. IV Ultra sound being done in need of IV. Having difficulty obtaining IV.  Patient Name: Marisa Campbell [id number] Date: 11/25/2020   Age:41 hours  Maternal Data    Feeding    LATCH Score                    Lactation Tools Discussed/Used    Interventions    Discharge    Consult Status      [name] 11/25/2020, 4:19 AM

## 2020-11-25 NOTE — Progress Notes (Signed)
Post Partum Day VAVD at 34 weeks, IOL d/t SISPEC, IUGR and A1DM Subjective: Pt without complaints today. Ambulating, voiding, tolerating diet and good oral pain control.  Objective: Blood pressure (!) 142/81, pulse (!) 56, temperature 97.7 F (36.5 C), temperature source Oral, resp. rate 18, height 5' 2.5" (1.588 m), weight (!) 142.9 kg, last menstrual period 03/30/2020, SpO2 98 %, unknown if currently breastfeeding.  Physical Exam:  General: alert Lochia: appropriate Uterine Fundus: firm Incision: NA DVT Evaluation: No evidence of DVT seen on physical exam.  Recent Labs    11/24/20 1000 11/24/20 2149  HGB 12.3 12.1  HCT 34.8* 34.6*    Assessment/Plan: Magnesium x 24 hrs. BP stable on Procardia. CBG's good. Continue with progressive care.    LOS: 15 days   Marisa Campbell 11/25/2020, 1:01 PM

## 2020-11-25 NOTE — Lactation Note (Signed)
This note was copied from a baby's chart. Lactation Consultation Note Experienced bf'ing mother who recently weaned her last baby. LC assembled pump and assisted with initial pumping. Provided brochure. Reviewed feeding behaviors of 34 weekers. Will plan f/u visits in NICU.  Patient Name: Marisa Campbell [id number]'M Date: 11/25/2020 Reason for consult: NICU baby;Initial assessment;Late-preterm 34-36.6wks Age:41 hours  Maternal Data Has patient been taught Hand Expression?: Yes Does the patient have breastfeeding experience prior to this delivery?: Yes How long did the patient breastfeed?: 1+ years with other children No hx breast surgery/trauma Normal breast symmetry  AMA  Feeding Mother's Current Feeding Choice: Breast Milk and Donor Milk   Interventions Interventions: Education;DEBP;Hand express;Expressed milk  Discharge Pump: DEBP WIC Program: Yes  Consult Status Consult Status: Follow-up Follow-up type: In-patient   Elder Negus, MA IBCLC 11/25/2020, 10:20 AM

## 2020-11-26 LAB — TYPE AND SCREEN
ABO/RH(D): AB POS
Antibody Screen: POSITIVE
Unit division: 0
Unit division: 0

## 2020-11-26 LAB — BPAM RBC
Blood Product Expiration Date: 202205282359
Blood Product Expiration Date: 202205282359
Unit Type and Rh: 5100
Unit Type and Rh: 5100

## 2020-11-26 MED ORDER — NIFEDIPINE ER OSMOTIC RELEASE 30 MG PO TB24
30.0000 mg | ORAL_TABLET | Freq: Two times a day (BID) | ORAL | Status: DC
  Administered 2020-11-26 – 2020-11-27 (×3): 30 mg via ORAL
  Filled 2020-11-26 (×3): qty 1

## 2020-11-26 MED ORDER — HYDROCHLOROTHIAZIDE 25 MG PO TABS
25.0000 mg | ORAL_TABLET | Freq: Every day | ORAL | Status: DC
  Administered 2020-11-26 – 2020-11-27 (×2): 25 mg via ORAL
  Filled 2020-11-26 (×2): qty 1

## 2020-11-26 NOTE — Lactation Note (Signed)
This note was copied from a baby's chart. Lactation Consultation Note  Patient Name: Marisa Campbell [id number] Date: 11/26/2020 Reason for consult: Follow-up assessment;NICU baby;Late-preterm 34-36.6wks Age:41 hours   I followed up with Ms. Braid. She had some questions about milk production and pumping. We reviewed the pump settings on her DEBP, and I educated on expected pumping volumes for days 1-4.  I taught Ms. Booton how to hand express. She states that she's never used her hands to express her milk. She was able to repeat back well, and we were able to express some colostrum into a container. She plans to take that to NICU for oral care later today.  Ms. Rudene Anda expressed that her main concern is that she will produce enough for her baby. I provided her lots of praise for her consistent pumping and encouraged her to continue to pump every 2-3 hours during the day and every 3-4 hours at night.  She is keeping a pumping log. I used the NICU booklet in my education.  Ms. Mallozzi denied need for any supplies at this time.   Maternal Data Has patient been taught Hand Expression?: Yes Does the patient have breastfeeding experience prior to this delivery?: Yes  Feeding Mother's Current Feeding Choice: Breast Milk and Donor Milk   Lactation Tools Discussed/Used Tools: Pump Breast pump type: Double-Electric Breast Pump Pump Education: Setup, frequency, and cleaning Reason for Pumping: NICU Pumping frequency: q2-3 Pumped volume: 0 mL  Interventions Interventions: Breast feeding basics reviewed;Hand express;DEBP;Education  Discharge Pump: DEBP WIC Program: Yes  Consult Status Consult Status: Follow-up Date: 11/27/20 Follow-up type: In-patient    Walker Shadow 11/26/2020, 9:56 AM

## 2020-11-26 NOTE — Progress Notes (Signed)
Post Partum Day 2 Subjective: no complaints, up ad lib, voiding and tolerating PO  Objective: Blood pressure (!) 149/78, pulse (!) 58, temperature 97.6 F (36.4 C), temperature source Oral, resp. rate 18, height 5' 2.5" (1.588 m), weight (!) 142.9 kg, last menstrual period 03/30/2020, SpO2 96 %, unknown if currently breastfeeding.  Physical Exam:  General: alert, cooperative and no distress Lochia: appropriate Uterine Fundus: firm Incision:  DVT Evaluation: No evidence of DVT seen on physical exam.  Recent Labs    11/24/20 1000 11/24/20 2149  HGB 12.3 12.1  HCT 34.8* 34.6*    Assessment/Plan: Plan for discharge tomorrow  suboptimal BP control, increase to Procardia 30 BID and add HCTZ   LOS: 16 days   Lazaro Arms 11/26/2020, 9:16 AM

## 2020-11-27 ENCOUNTER — Ambulatory Visit: Payer: Medicaid Other

## 2020-11-27 MED ORDER — HYDROCHLOROTHIAZIDE 25 MG PO TABS: 25.0000 mg | ORAL_TABLET | Freq: Every day | ORAL | 1 refills | Status: DC

## 2020-11-27 MED ORDER — SENNOSIDES-DOCUSATE SODIUM 8.6-50 MG PO TABS
2.0000 | ORAL_TABLET | ORAL | Status: DC
  Administered 2020-11-27: 2 via ORAL
  Filled 2020-11-27: qty 2

## 2020-11-27 MED ORDER — NIFEDIPINE ER 30 MG PO TB24: 30.0000 mg | ORAL_TABLET | Freq: Two times a day (BID) | ORAL | 1 refills | Status: DC

## 2020-11-27 MED ORDER — SENNOSIDES-DOCUSATE SODIUM 8.6-50 MG PO TABS
2.0000 | ORAL_TABLET | ORAL | Status: DC
Start: 2020-11-28 — End: 2020-11-27

## 2020-11-27 MED ORDER — IBUPROFEN 600 MG PO TABS: 600.0000 mg | ORAL_TABLET | Freq: Four times a day (QID) | ORAL | 0 refills | Status: DC

## 2020-11-27 NOTE — Progress Notes (Signed)
Patient screened out for psychosocial assessment since none of the following apply: °Psychosocial stressors documented in mother or baby's chart °Gestation less than 32 weeks °Code at delivery  °Infant with anomalies °Please contact the Clinical Social Worker if specific needs arise, by MOB's request, or if MOB scores greater than 9/yes to question 10 on Edinburgh Postpartum Depression Screen. ° °Nyron Mozer Boyd-Gilyard, MSW, LCSW °Clinical Social Work °[phone] °  °

## 2020-11-27 NOTE — Discharge Instructions (Signed)

## 2020-11-28 LAB — SURGICAL PATHOLOGY

## 2020-12-01 ENCOUNTER — Ambulatory Visit (INDEPENDENT_AMBULATORY_CARE_PROVIDER_SITE_OTHER): Payer: Medicaid Other

## 2020-12-01 ENCOUNTER — Other Ambulatory Visit: Payer: Self-pay

## 2020-12-01 VITALS — BP 147/93 | HR 72

## 2020-12-01 DIAGNOSIS — Z0130 Encounter for examination of blood pressure without abnormal findings: Secondary | ICD-10-CM

## 2020-12-01 NOTE — Progress Notes (Signed)
..  Subjective:  Marisa Campbell is a 40 y.o. female here for BP check.  Pt reports that she just took BP medications 30 minutes ago.  Hypertension ROS: taking medications as instructed, no medication side effects noted, no TIA's, no chest pain on exertion, no dyspnea on exertion and no swelling of ankles.    Objective:  BP (!) 147/93   Pulse 72   Appearance alert, well appearing, and in no distress. General exam BP noted to be well controlled today in office.    Assessment:   Blood Pressure stable.   Plan:  Current treatment plan is effective, no change in therapy.  Next app is scheduled for 01-04-21 Advised pt to call if abnormal symptoms occur

## 2020-12-05 ENCOUNTER — Ambulatory Visit: Payer: Medicaid Other

## 2020-12-05 NOTE — Progress Notes (Signed)
Agree with A & P. 

## 2020-12-06 ENCOUNTER — Ambulatory Visit: Payer: Self-pay

## 2020-12-06 NOTE — Lactation Note (Signed)
This note was copied from a baby's chart. Lactation Consultation Note Observed 20 minute feeding that was mostly non-nutritive. Occasional swallows. Mom pumped 2 hours before this feeding session. Full gavage administered. Scheduled next observed feeding for Sunday at 8am. Mother is aware of LC services on unit before that time prn.   Patient Name: Marisa Campbell [id number] Date: 12/06/2020 Reason for consult: Mother's request;NICU baby;Other (Comment) (observed breastfeeding) Age:41 days  Maternal Data  pumps 8xday with 75-105mL average  Feeding Nipple Type: Nfant Extra Slow Flow (gold)   Interventions Interventions: Assisted with latch;Adjust position;Support pillows;Position options (chin pull to deepen latch)   Consult Status Consult Status: Follow-up Date: 12/10/20 (0800 feeding) Follow-up type: In-patient   Elder Negus, MA IBCLC 12/06/2020, 8:41 AM

## 2020-12-10 ENCOUNTER — Ambulatory Visit: Payer: Self-pay

## 2020-12-10 NOTE — Lactation Note (Addendum)
This note was copied from a baby's chart. Lactation Consultation Note  Patient Name: Marisa Campbell HYQMV'[id number] Date: 12/10/2020 Reason for consult: Follow-up assessment;Difficult latch;Infant < 6lbs;NICU baby;Late-preterm 34-36.6wks;Maternal endocrine disorder Age:41 wk.o.  "[name]"  LC in to assist with P6 Mom of [redacted]w[redacted]d AGA baby weighing  4 lbs9.6oz today.  Baby has been increasing PO feeds to 31% yesterday.    Mom pumped 10 mins for 70 ml at 0730  Breasts soft and compressible.  Assisted Mom with pillow support for football hold.  Baby showing feeding cues.  Baby placed STS at right breast.  Mom has large diameter nipples that dimple in the center.  Baby rooted once milk expressed onto nipple.  Baby unable to sustain a deep latch.  LC sandwiched areola to assist baby to grasp deeper, but baby unable to sustain.    Mom's nipples sized for 24 mm nipple shield (hospital supply out currently).  LC tried to fit Mom onto 20 mm after a couple attempts.  Baby able to sustain a wider latch, but still unable to transfer milk.  No milk noted in shield and nipple not pulled well into shield.  Baby showed no signs of stress and was very relaxed after breastfeeding attempt.   Encouraged Mom to continue to try without nipple shield, until she can obtain a 24 mm shield.  Baby shows a desire, but mouth too small to handle depth on the breast.   Baby followed with bottle (per RN) of 12 ml.  Mom paced bottle fed with baby on her side and she did very well.  Mom encouraged to pump before breastfeeding for 10 mins and then follow-up after breastfeeding to support her milk supply.  Mom had been using initiation setting on pump.  Instructed Mom to pump on maintenance setting (demonstrated this on her pump) and to pump 15-30 mins.   2 bins provided for washing and drying.  Mom encouraged to allow pump parts to air dry.  F/U with LC 6/8 at 0800.   Feeding Nipple Type: Nfant Extra Slow Flow (gold)  LATCH  Score Latch: Repeated attempts needed to sustain latch, nipple held in mouth throughout feeding, stimulation needed to elicit sucking reflex.  Audible Swallowing: None  Type of Nipple: Everted at rest and after stimulation (large nipple diameter)  Comfort (Breast/Nipple): Soft / non-tender  Hold (Positioning): Assistance needed to correctly position infant at breast and maintain latch.  LATCH Score: 6   Lactation Tools Discussed/Used Tools: Nipple Dorris Carnes;Pump Nipple shield size: 20 (Mom to purchase a 24 mm nipple shield) Breast pump type: Double-Electric Breast Pump Pumping frequency: Q 3 hrs Pumped volume: 100 mL - 120 ml   Interventions Interventions: Breast feeding basics reviewed;Assisted with latch;Skin to skin;Breast massage;Hand express;Pre-pump if needed;Adjust position;Support pillows;Position options;DEBP;Education   Consult Status Consult Status: Follow-up Date: 12/11/20 Follow-up type: In-patient    Judee Clara 12/10/2020, 8:34 AM

## 2020-12-13 ENCOUNTER — Telehealth: Payer: Self-pay

## 2020-12-13 ENCOUNTER — Ambulatory Visit: Payer: Self-pay

## 2020-12-13 NOTE — Lactation Note (Signed)
This note was copied from a baby's chart. Lactation Consultation Note Baby at breast for 14 minutes. Mom pre-pumped and infant was mostly non-nutritive. Will re-challenge at 0800 feeding tomorrow with full breast.   Patient Name: Marisa Campbell [id number] Date: 12/13/2020 Reason for consult: Other (Comment) (Observed breastfeeding) Age:41 wk.o.  Maternal Data  milk supply is wnl   Feeding Mother's Current Feeding Choice: Breast Milk Nipple Type: Nfant Extra Slow Flow (gold)    Lactation Tools Discussed/Used Pumping frequency: 8x Pumped volume: 750 mL  Interventions Interventions: Education;Breast feeding basics reviewed;Position options;Assisted with latch  Consult Status Consult Status: Follow-up Date: 12/14/20 (8am feeding) Follow-up type: In-patient   Elder Negus, MA IBCLC 12/13/2020, 8:31 AM

## 2020-12-13 NOTE — Telephone Encounter (Signed)
Patient called and left message on triage vm stating that she has been having leg cramps which she has been eating bananas to help increase potassium intake. Also complains having a sore arm from IV. She reports taking her BP meds, no headache, and normal BP readings.  Patient advised to continue to monitor leg cramps. DVT precautions explained to patient. Advised that she can apply ice to arm for soreness.

## 2020-12-13 NOTE — Discharge Summary (Signed)
Postpartum Discharge Summary                             Patient Name: Marisa Campbell DOB: 02/17/1981 MRN: [id number]  Date of admission: 11/10/2020 Delivery date:11/24/2020  Delivering provider: Florian Buff  Date of discharge: 11/27/20  Admitting diagnosis: Severe preeclampsia, third trimester [O14.13] Intrauterine pregnancy: [redacted]w[redacted]d    Secondary diagnosis:  Principal Problem:   Chronic hypertension with superimposed severe preeclampsia Active Problems:   Red blood cell antibody positive   Advanced maternal age in multigravida   History of 2 cesarean sections   Maternal morbid obesity, antepartum ([place]   Gestational diabetes   Grand multiparity with current pregnancy   Severe preeclampsia, third trimester   IUGR (intrauterine growth retardation) affecting mother, third trimester   Bradycardia with 51-60 beats per minute   GBS (group B Streptococcus carrier), +RV culture, currently pregnant  Additional problems:                                 Discharge diagnosis: Preterm Pregnancy Delivered, VBAC, Preeclampsia (severe), CHTN with superimposed preeclampsia, GDM A1 and Anemia                                              Post partum procedures: Augmentation: AROM, Pitocin and IP Foley Complications: None  Hospital course: Induction of Labor With Vaginal Delivery   40y.o. yo [id number] admitted to the hospital 11/10/2020 for concern for cHTN w superimposed severe preeclampsia at 3274w1dhe received magnesium on admission and was placed on procarida BID, labetalol. She was induced at 34w38w0ddication for induction: Preeclampsia.  Patient had an uncomplicated labor course as follows: Membrane Rupture Time/Date: 3:51 PM ,11/24/2020   Delivery Method:Vaginal, Vacuum (Extractor)  Episiotomy: None  Lacerations:  None  Details of delivery can be found in separate delivery note.  Patient had a routine postpartum course. Patient is discharged home  11/24/20.  Newborn Data: Birth date:11/24/2020  Birth time:10:04 PM  Gender:Female  Living status:Living  Apgars:9 ,10  Weight:1710 g   Magnesium Sulfate received: Yes: Seizure prophylaxis BMZ received: Yes Rhophylac:N/A MMR:No T-DaP:Given prenatally Flu: No Transfusion:No  Physical exam        Vitals:   11/24/20 2100 11/24/20 2130 11/24/20 2230 11/24/20 2245  BP: 116/60 132/69 (!) 154/78 (!) 155/79  Pulse: 64 60 88 86  Resp: 20 20 20 18   Temp:      TempSrc:      SpO2:   99% 99%   General: alert, cooperative and no distress Lochia: appropriate Uterine Fundus: firm Incision: N/A DVT Evaluation: No evidence of DVT seen on physical exam. Labs: Recent Labs       Lab Results  Component Value Date   WBC 14.2 (H) 11/24/2020   HGB 12.1 11/24/2020   HCT 34.6 (L) 11/24/2020   MCV 86.1 11/24/2020   PLT 190 11/24/2020     CMP Latest Ref Rng & Units 11/24/2020  Glucose 70 - 99 mg/dL 102(H)  BUN 6 - 20 mg/dL 11  Creatinine 0.44 - 1.00 mg/dL 0.73  Sodium 135 - 145 mmol/L 129(L)  Potassium 3.5 - 5.1 mmol/L 4.0  Chloride 98 - 111 mmol/L 101  CO2 22 - 32 mmol/L 22  Calcium 8.9 - 10.3 mg/dL 6.8(L)  Total Protein 6.5 - 8.1 g/dL 5.9(L)  Total Bilirubin 0.3 - 1.2 mg/dL 0.5  Alkaline Phos 38 - 126 U/L 100  AST 15 - 41 U/L 28  ALT 0 - 44 U/L 30   Edinburgh Score: Edinburgh Postnatal Depression Scale Screening Tool 06/01/2019  I have been able to laugh and see the funny side of things. 0  I have looked forward with enjoyment to things. 0  I have blamed myself unnecessarily when things went wrong. 0  I have been anxious or worried for no good reason. 0  I have felt scared or panicky for no good reason. 0  Things have been getting on top of me. 0  I have been so unhappy that I have had difficulty sleeping. 0  I have felt sad or miserable. 0  I have been so unhappy that I have been crying. 0  The thought of harming myself has occurred to me. 0   Edinburgh Postnatal Depression Scale Total 0     After visit meds:  Allergies as of 11/24/2020   No Known Allergies   Med Rec must be completed prior to using this [facility]        Discharge home in stable condition Infant Feeding: Breast Infant Disposition:NICU Discharge instruction: per After Visit Summary and Postpartum booklet. Activity: Advance as tolerated. Pelvic rest for 6 weeks.  Diet: routine diet Future Appointments:No future appointments. Follow up Visit:   Please schedule this patient for a In person postpartum visit in 4 weeks with the following provider: MD. Additional Postpartum F/U:2 hour GTT at 4 weeks and Incision check 1 week  High risk pregnancy complicated by: GDM, HTN and VBAC Delivery mode: Vaginal, Vacuum Neurosurgeon)  Anticipated Birth Control: Unsure   11/27/20 Janet Berlin, MD

## 2020-12-15 ENCOUNTER — Ambulatory Visit: Payer: Self-pay

## 2020-12-15 NOTE — Lactation Note (Signed)
This note was copied from a baby's chart. Lactation Consultation Note  Patient Name: Marisa Campbell [id number] Date: 12/15/2020 Reason for consult: Follow-up assessment Age:41 wk.o.  Per mom infant fed very well yesterday.  15 min. On the right side then 15 min on the left without issue. No NS used.  LC had mom placed infant STS.  Infant was alert and cueing.  Mom attempted to BF in cradle hold on the left side but infant was unable to sustain latch.  Infant would suck 3-4 times then tongue thrust the nipple from her mouth.  The NS was applied and infant continued with the shield.  LC suggested football hold for more head/neck support.  Mom filled NS by hand expressing.  Infant latched but after the gulp, paused at the breast.  Massage and compression used to encourage sucking but infant continued to come off.  RIght side was attempted with NS.  Football hold used.  Mom latched infant well and infant sustained latch with a few swallows when mom's milk flow started. After that, infant had a few non nutritive sucks.  She did not come off the breast but there was no active and continual, rhythmic sucking noted.  Mom pumps 8X daily.  She pumped at 5am this morning.   Follow up set up for 8am 6/14.   Mom will continue to try without shield, STS.  If infant is coming on and off the breast, use NS applying like LC reviewed with her today.    Maternal Data    Feeding Mother's Current Feeding Choice: Breast Milk Nipple Type: Dr. Levert Feinstein Preemie  LATCH Score Latch: Repeated attempts needed to sustain latch, nipple held in mouth throughout feeding, stimulation needed to elicit sucking reflex.  Audible Swallowing: A few with stimulation  Type of Nipple: Everted at rest and after stimulation  Comfort (Breast/Nipple): Soft / non-tender  Hold (Positioning): Assistance needed to correctly position infant at breast and maintain latch.  LATCH Score: 7   Lactation Tools  Discussed/Used Tools: Nipple Shields Nipple shield size:  (lansinoh shiled) Breast pump type: Double-Electric Breast Pump Reason for Pumping: NICU Pumped volume: 150 mL (last pump mom collected )  Interventions Interventions: Breast feeding basics reviewed;Assisted with latch;Skin to skin;Breast massage;Adjust position;Support pillows;Position options  Discharge Pump: DEBP  Consult Status Consult Status: Follow-up Date: 12/19/20 (appointment for 50m 6/14) Follow-up type: In-patient    Maryruth Hancock [facility] 12/15/2020, 8:46 AM

## 2020-12-19 ENCOUNTER — Ambulatory Visit: Payer: Self-pay

## 2020-12-19 NOTE — Lactation Note (Signed)
This note was copied from a baby's chart. Lactation Consultation Note  Patient Name: Marisa Campbell [id number] Date: 12/19/2020 Reason for consult: Follow-up assessment;Difficult latch;Infant < 6lbs;Early term 37-38.6wks;NICU baby Age:41 wk.o.  LC in to assist/assess with breastfeeding. P6 Mom last pumped 3 hrs prior.  Mom denies having an overactive let down and no need to pre-pump.   Baby showing cues before feeding.  Mom trying  in cradle hold on right breast.  Mom's nipple is large and baby can not latch deeply enough.   Assisted with cross cradle, but baby still unable to open her mouth and latch deep enough.  Baby keeps her tongue down an cups under breast, but nipple slips out repeatedly.  Mom voiced frustration with nipple shield as she has trouble applying it.  24 mm nipple shield is challenging applying to right nipple. Switched to left breast where nipple is not as large.  Baby cueing and opening her mouth, but remains on nipple only sucking.  LC adjusted baby's gape of mouth by tugging on her chin, and compressed the breast and baby able to transfer milk at that time.   Talked to Mom about baby showing great interest in latching, but growth and maturity is needed for baby to be able to transfer adequate milk at breast.  Mom will need OP lactation f/u after baby's discharge.  Mom asking what it will take for baby to be able to come home.  Talked about increased PO feedings.  Baby is doing well on bottles as she took about 40-50% of feedings po last night.   Recommendation- 1- Continue with consistent pumping to support a full milk supply 2- Offer breast when able to. 3- lots of STS 4- Ask for OP lactation F/U after discharge.  Lactation to f/u 6/16 at 0800    Feeding Nipple Type: Dr. Levert Feinstein Preemie  LATCH Score Latch: Repeated attempts needed to sustain latch, nipple held in mouth throughout feeding, stimulation needed to elicit sucking reflex.  Audible  Swallowing: A few with stimulation (2 swallows with breast compression)  Type of Nipple: Everted at rest and after stimulation (large diameter)  Comfort (Breast/Nipple): Soft / non-tender  Hold (Positioning): Assistance needed to correctly position infant at breast and maintain latch.  LATCH Score: 7   Lactation Tools Discussed/Used Tools: Pump;Nipple Dorris Carnes;Flanges;Bottle Nipple shield size: 24 Flange Size: 27 Breast pump type: Double-Electric Breast Pump Pumping frequency: 8 X Pumped volume: 150 mL  Interventions Interventions: Breast feeding basics reviewed;Assisted with latch;Skin to skin;Breast massage;Hand express;Breast compression;Adjust position;Support pillows;Position options;DEBP;Education  Consult Status Consult Status: Follow-up Date: 12/21/20 Follow-up type: In-patient    Judee Clara 12/19/2020, 8:59 AM

## 2020-12-21 ENCOUNTER — Ambulatory Visit: Payer: Self-pay

## 2020-12-21 NOTE — Lactation Note (Signed)
This note was copied from a baby's chart. Lactation Consultation Note  Patient Name: Marisa Campbell [id number] Date: 12/21/2020 Reason for consult: Follow-up assessment;Difficult latch;NICU baby;Early term 37-38.6wks;Infant < 6lbs Age:41 wk.o.  LC in to assist/assess with breastfeeding.  Baby alert and cueing. Mom focusing on left breast as nipple is smaller in diameter. Baby positioned baby in football hold and baby attempted to latch several times repeatedly sliding off onto nipple tip.  LC initiated 24 mm nipple shield.  Baby able to sustain a semi-deeper latch and relaxed her body.  Baby noted to be sucking/swallowing more consistently for about 7 mins.  Mom very good about watching baby for stress cues.  No bradycardic or desat event during feeding.  Mom wanting to offer baby a bottle as baby was awake and rooting after diaper change.  RN aware.    F/U 6/19 at 0800   Feeding Nipple Type: Nfant Extra Slow Flow (gold)  LATCH Score Latch: Repeated attempts needed to sustain latch, nipple held in mouth throughout feeding, stimulation needed to elicit sucking reflex.  Audible Swallowing: A few with stimulation  Type of Nipple: Everted at rest and after stimulation (large diameter nipple)  Comfort (Breast/Nipple): Soft / non-tender  Hold (Positioning): Assistance needed to correctly position infant at breast and maintain latch.  LATCH Score: 7   Lactation Tools Discussed/Used Tools: Pump;Nipple Dorris Carnes;Flanges Nipple shield size: 24 Breast pump type: Double-Electric Breast Pump Pumping frequency: Q 3 hrs Pumped volume: 120 mL (120-180 ml per pump)  Interventions Interventions: Breast feeding basics reviewed;Assisted with latch;Skin to skin;Breast massage;Hand express;Support pillows;Adjust position;Breast compression;Position options;DEBP;Education  Consult Status Consult Status: Follow-up Date: 12/24/20 Follow-up type: In-patient    Judee Clara 12/21/2020, 8:46 AM

## 2020-12-24 ENCOUNTER — Ambulatory Visit: Payer: Self-pay

## 2020-12-24 NOTE — Lactation Note (Signed)
This note was copied from a baby's chart. Lactation Consultation Note  Patient Name: Marisa Campbell [id number] Date: 12/24/2020 Reason for consult: Follow-up assessment;Infant < 6lbs;Early term 37-38.6wks;NICU baby Age:41 wk.o.  Baby gaining well and yesterday took her full feeding twice by bottle.  LC in to assist/assess with positioning and latching baby to the breast.  Baby had recently gotten off schedule with her feedings and fed an hour ago.   Baby trying to latch without nipple shield, but isn't opening her mouth widely enough to sustain a deep latch.  LC initiated a 24 mm nipple shield, showing Mom how to apply.  Baby still not opening wide enough and started turning away from breast.   LC placed STS on Mom's chest while baby fed full gavage feeding.   Mom understands importance of STS during feedings and consistent pumping to support her milk supply.  F/U with lactation 12/26/20 at 0800  Feeding Nipple Type: Dr. Levert Feinstein Preemie  LATCH Score Latch: Repeated attempts needed to sustain latch, nipple held in mouth throughout feeding, stimulation needed to elicit sucking reflex.  Audible Swallowing: None  Type of Nipple: Everted at rest and after stimulation  Comfort (Breast/Nipple): Soft / non-tender  Hold (Positioning): Assistance needed to correctly position infant at breast and maintain latch.  LATCH Score: 6   Lactation Tools Discussed/Used Tools: Pump;Flanges;Nipple Dorris Carnes;Bottle Nipple shield size: 24 Breast pump type: Double-Electric Breast Pump Pumping frequency: 7-8 times per 24 hrs Pumped volume: 150 mL (180 ml first thing in AM)  Interventions Interventions: Breast feeding basics reviewed;Assisted with latch;Skin to skin;Breast massage;Hand express;Breast compression;Adjust position;Support pillows;Position options;DEBP   Consult Status Consult Status: Follow-up Date: 12/26/20 Follow-up type: In-patient    Marisa Campbell 12/24/2020,  8:30 AM

## 2020-12-26 ENCOUNTER — Ambulatory Visit: Payer: Self-pay

## 2020-12-26 NOTE — Lactation Note (Signed)
This note was copied from a baby's chart. Lactation Consultation Note Reviewed feeding on demand and with feeding cues. Will refer to outpatient LC p d/c.  Patient Name: Marisa Campbell [id number] Date: 12/26/2020 Reason for consult: Follow-up assessment Age:42 wk.o.  Maternal Data  Milk supply is wnl  Feeding Mother's Current Feeding Choice: Breast Milk Nipple Type: Dr. Levert Feinstein Preemie   Lactation Tools Discussed/Used Pumping frequency: 7xday Pumped volume: 150 mL  Interventions Interventions: Education  Discharge Discharge Education: Outpatient recommendation;Engorgement and breast care  Consult Status Consult Status: Follow-up Follow-up type: In-patient   Elder Negus, MA IBCLC 12/26/2020, 8:40 AM

## 2021-01-04 ENCOUNTER — Other Ambulatory Visit: Payer: Self-pay

## 2021-01-04 ENCOUNTER — Ambulatory Visit (INDEPENDENT_AMBULATORY_CARE_PROVIDER_SITE_OTHER): Payer: Medicaid Other | Admitting: Obstetrics and Gynecology

## 2021-01-04 ENCOUNTER — Other Ambulatory Visit: Payer: Medicaid Other

## 2021-01-04 ENCOUNTER — Encounter: Payer: Self-pay | Admitting: Obstetrics and Gynecology

## 2021-01-04 VITALS — BP 129/84 | HR 72 | Ht 63.0 in | Wt 282.0 lb

## 2021-01-04 DIAGNOSIS — O24419 Gestational diabetes mellitus in pregnancy, unspecified control: Secondary | ICD-10-CM

## 2021-01-04 DIAGNOSIS — E039 Hypothyroidism, unspecified: Secondary | ICD-10-CM

## 2021-01-04 DIAGNOSIS — Z1231 Encounter for screening mammogram for malignant neoplasm of breast: Secondary | ICD-10-CM

## 2021-01-04 DIAGNOSIS — O24430 Gestational diabetes mellitus in the puerperium, diet controlled: Secondary | ICD-10-CM | POA: Diagnosis not present

## 2021-01-04 DIAGNOSIS — O99215 Obesity complicating the puerperium: Secondary | ICD-10-CM

## 2021-01-04 DIAGNOSIS — O99210 Obesity complicating pregnancy, unspecified trimester: Secondary | ICD-10-CM

## 2021-01-04 DIAGNOSIS — O34219 Maternal care for unspecified type scar from previous cesarean delivery: Secondary | ICD-10-CM

## 2021-01-04 DIAGNOSIS — O115 Pre-existing hypertension with pre-eclampsia, complicating the puerperium: Secondary | ICD-10-CM | POA: Diagnosis not present

## 2021-01-04 DIAGNOSIS — O365990 Maternal care for other known or suspected poor fetal growth, unspecified trimester, not applicable or unspecified: Secondary | ICD-10-CM

## 2021-01-04 DIAGNOSIS — Z98891 History of uterine scar from previous surgery: Secondary | ICD-10-CM

## 2021-01-04 DIAGNOSIS — O119 Pre-existing hypertension with pre-eclampsia, unspecified trimester: Secondary | ICD-10-CM

## 2021-01-04 MED ORDER — LABETALOL HCL 200 MG PO TABS: 200.0000 mg | ORAL_TABLET | Freq: Two times a day (BID) | ORAL | 3 refills | Status: DC

## 2021-01-04 NOTE — Progress Notes (Signed)
Post Partum Visit Note  Marisa Campbell is a 40 y.o. [phone] female who presents for a postpartum visit. She is 6 weeks postpartum following a vacuum-assisted vaginal delivery.  I have fully reviewed the prenatal and intrapartum course. The delivery was at 34.1 gestational weeks.  Anesthesia: none. Postpartum course has been fine. Baby is doing well, was in NICU for 5 wks due to prematurity. Baby is feeding by breast. Bleeding pink. Just started normal period, very light. Bowel function is normal. Bladder function is normal. Patient is not sexually active. Contraception method is none. Postpartum depression screening: negative.  Reports she is getting flushing on extensor services of arms and legs, chest, face, lasts 20-30 min after she takes her procardia and happens within 30-60 min after she takes procardia. Has only happened since delivery.  Still taking HCTZ and procardia 30 mg daily. Has gotten somewhat worse, (started on face and has spread to arms/legs). Not bothersome or itchy but wondering if this is allergic reaction or side effect. Had no issues with labetalol during pregnancy. Reports swelling in feet has improved significantly. Still has occasional swelling in left foot at end of day.   Overall, she is feeling well. Stressed because she and husband are separated so she does not have support at home with another adult but 8 yr old son is helpful in watching kids when she needs him to. Feels she is coping well.  She is breastfeeding/pumping, plenty of supply. Baby not latching well due to small size but she is pumping full time and no issues with pumping. Had seen lactation for pain that has improved quite a bit. Had fever for about a week post partum, has had no issues since. Never really felt bad.   The pregnancy intention screening data noted above was reviewed. Potential methods of contraception were discussed. The patient elected to proceed with No Method - Other Reason.     Edinburgh Postnatal Depression Scale - 01/04/21 0835       Edinburgh Postnatal Depression Scale:  In the Past 7 Days   I have been able to laugh and see the funny side of things. 0    I have looked forward with enjoyment to things. 0    I have blamed myself unnecessarily when things went wrong. 3    I have been anxious or worried for no good reason. 0    I have felt scared or panicky for no good reason. 0    Things have been getting on top of me. 0    I have been so unhappy that I have had difficulty sleeping. 0    I have felt sad or miserable. 0    I have been so unhappy that I have been crying. 0    The thought of harming myself has occurred to me. 0    Edinburgh Postnatal Depression Scale Total 3             Health Maintenance Due  Topic Date Due   COVID-19 Vaccine (1) Never done   URINE MICROALBUMIN  Never done   PAP SMEAR-Modifier  Never done    The following portions of the patient's history were reviewed and updated as appropriate: allergies, current medications, past family history, past medical history, past social history, past surgical history, and problem list.  Review of Systems Pertinent items are noted in HPI.  Objective:  BP 129/84   Pulse 72   Ht 5\' 3"  (1.6 m)   Wt  282 lb (127.9 kg)   LMP 01/03/2021 (Exact Date)   Breastfeeding Yes Comment: feeding breast milk by bottle  BMI 49.95 kg/m    General:  alert, cooperative, and appears stated age   Breasts:  not indicated  Lungs: Normal respiration noted  Heart:  Regular rate  Abdomen: Soft, non-tender, uterus difficult to palpate    Wound N/a   GU exam:  not indicated       Assessment:   1. Gestational diabetes mellitus (GDM), antepartum, gestational diabetes method of control unspecified Patient unable to stay for GTT today, will come back in one week, understands importance of follow up - Glucose tolerance, 2 hours  2. Chronic hypertension with superimposed preeclampsia Flushing with  procardia, worsening since delivery, will switch to labetalol - return 1 week for BP check - cont HCTZ  3. Maternal morbid obesity, antepartum (HCC)  4. Vacuum-assisted vaginal delivery  5. VBAC (vaginal birth after Cesarean)  6. History of 2 cesarean sections  7. Poor fetal growth affecting management of mother, antepartum, single or unspecified fetus  8. Hypothyroidism, unspecified type Feeling well, check TSH today - TSH  9. Encounter for screening mammogram for malignant neoplasm of breast - MM 3D SCREEN BREAST BILATERAL; Future - has never had mammogram  Normal postpartum exam.   Plan:   Essential components of care per ACOG recommendations:  1.  Mood and well being: Patient with negative depression screening today. Reviewed local resources for support.  - Patient tobacco use? No.   - hx of drug use? No.    2. Infant care and feeding:  -Patient currently breastmilk feeding? Patient doing well with pumping full time.  -Social determinants of health (SDOH) reviewed in EPIC. No concerns  3. Sexuality, contraception and birth spacing - Patient does not want a pregnancy in the next year.  Desired family size is unknown children.  - Reviewed forms of contraception in tiered fashion. Patient desired no method today, last three pregnancies were planned with no contraception.   4. Sleep and fatigue -Encouraged family/partner/community support of 4 hrs of uninterrupted sleep to help with mood and fatigue  5. Physical Recovery  - Discussed patients delivery and complications. - Patient had a  vacuum assisted VBAC . Patient had a  no  laceration. Perineal healing reviewed. Patient expressed understanding - Patient has urinary incontinence? No. - Patient is safe to resume physical and sexual activity  6.  Health Maintenance - HM due items addressed Yes - Last pap smear No results found for: DIAGPAP Pap smear not done at today's visit- patient had one in 2021 at [facility], will get records. -Breast Cancer screening indicated? Yes. Patient referred today for mammogram.   7. Chronic Disease/Pregnancy Condition follow up: Hypertension - procardia switched to labetalol today - f/u 1 week for BP check  - PCP follow up  Conan Bowens, MD Center for [facility], [facility]

## 2021-01-05 ENCOUNTER — Other Ambulatory Visit: Payer: Medicaid Other

## 2021-01-05 LAB — TSH: TSH: 0.218 u[IU]/mL — ABNORMAL LOW (ref 0.450–4.500)

## 2021-01-12 ENCOUNTER — Other Ambulatory Visit: Payer: Self-pay

## 2021-01-12 ENCOUNTER — Other Ambulatory Visit: Payer: Medicaid Other

## 2021-01-12 DIAGNOSIS — Z8632 Personal history of gestational diabetes: Secondary | ICD-10-CM

## 2021-01-13 LAB — GLUCOSE TOLERANCE, 2 HOURS
Glucose, 2 hour: 99 mg/dL (ref 65–139)
Glucose, GTT - Fasting: 88 mg/dL (ref 65–99)

## 2021-01-17 MED ORDER — LEVOTHYROXINE SODIUM 125 MCG PO TABS: 125.0000 ug | ORAL_TABLET | Freq: Every day | ORAL | 1 refills | Status: DC

## 2021-01-18 ENCOUNTER — Ambulatory Visit (HOSPITAL_BASED_OUTPATIENT_CLINIC_OR_DEPARTMENT_OTHER): Admission: RE | Admit: 2021-01-18 | Payer: Medicaid Other | Source: Ambulatory Visit | Admitting: Radiology

## 2021-01-19 IMAGING — US US MFM FETAL BPP W/O NON-STRESS
1 series · 15 of 28 positions shown · non-contrast
Comparison: none

[Series 1: us mfm fetal bpp w/o non-stress · 35 acquisitions, 15 frames shown]
[im 1/35]
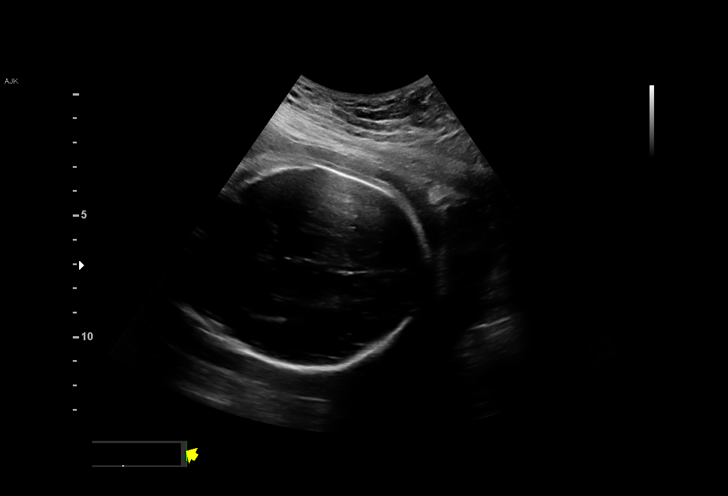
[im 3/35]
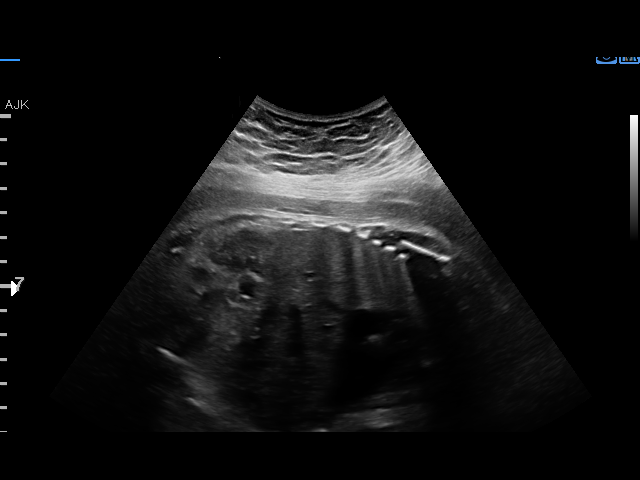
[im 6/35]
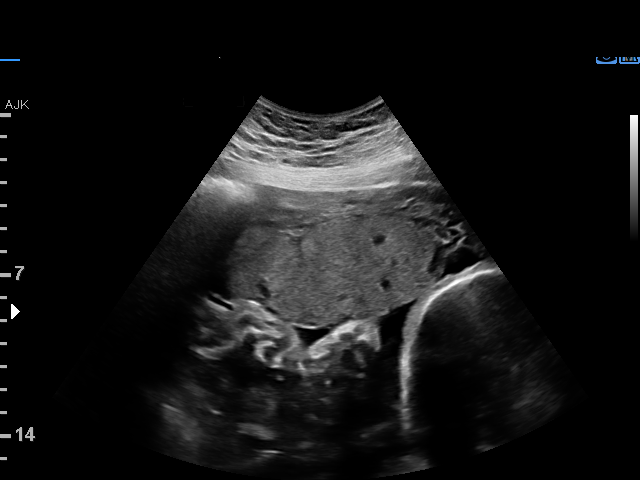
[im 8/35]
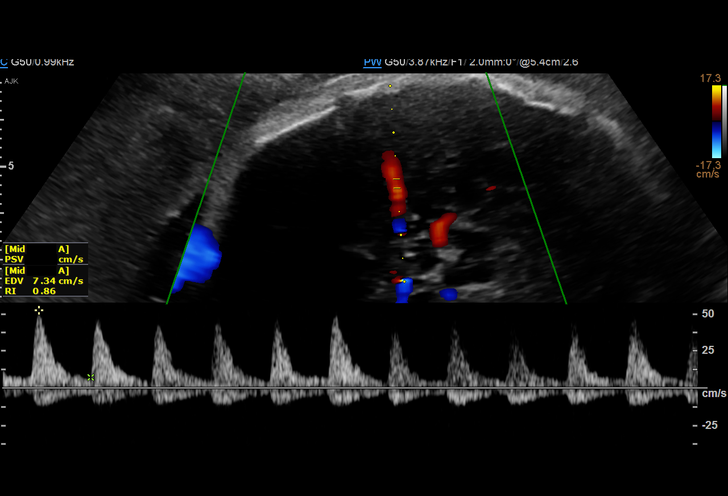
[im 11/35]
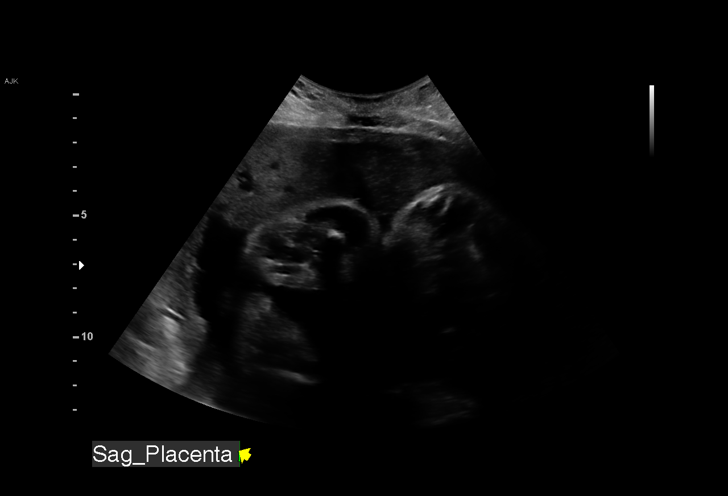
[im 13/35]
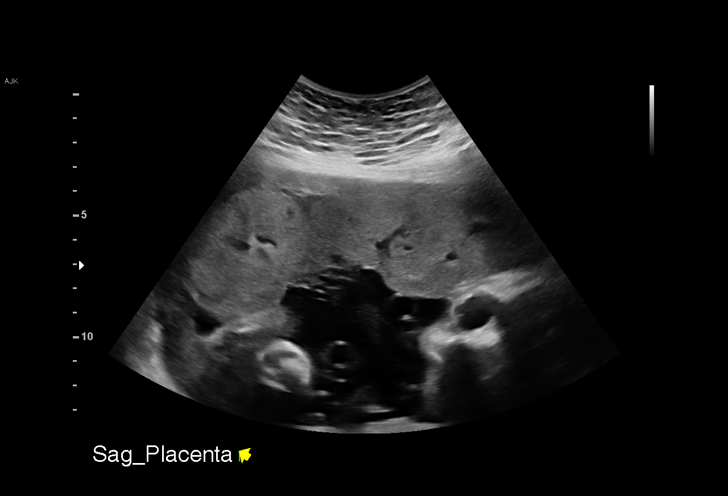
[im 16/35]
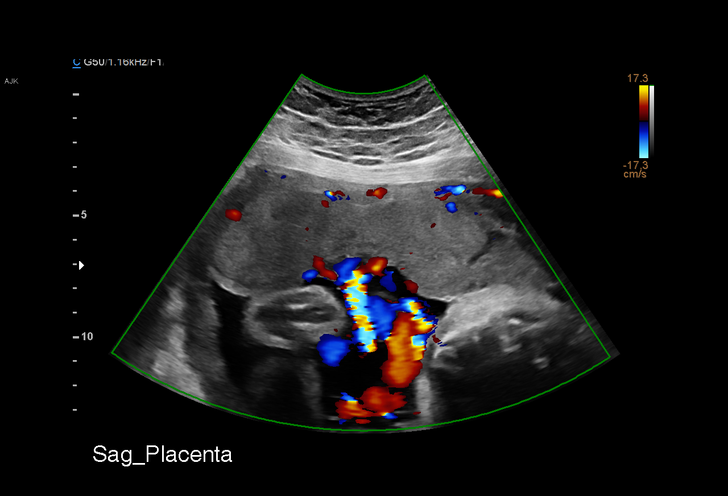
[im 18/35]
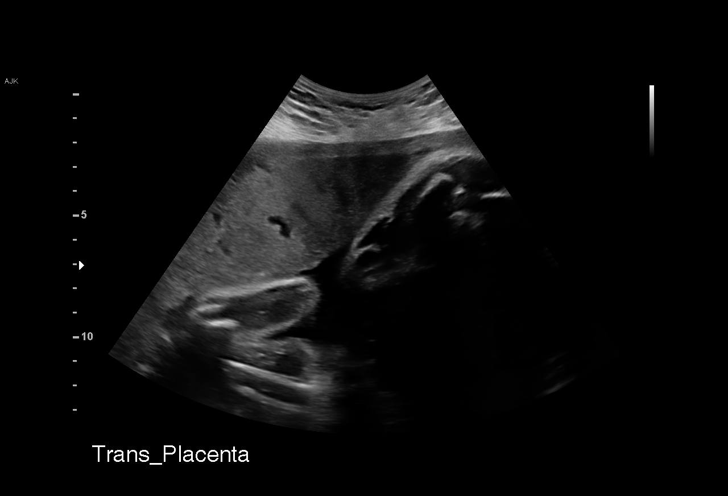
[im 19/35]
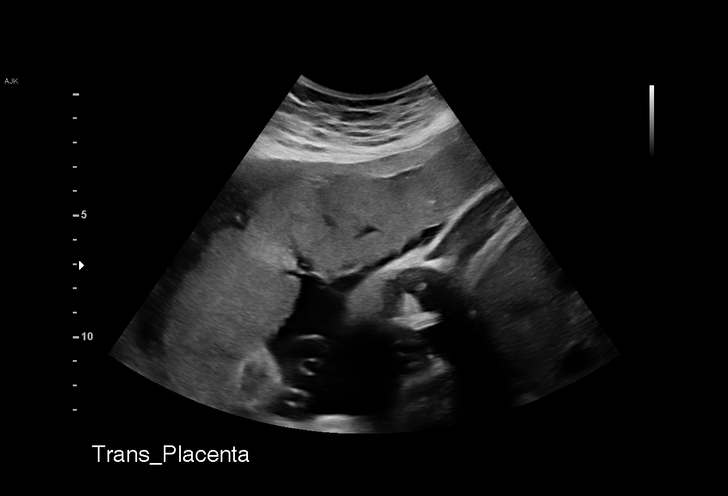
[im 22/35]
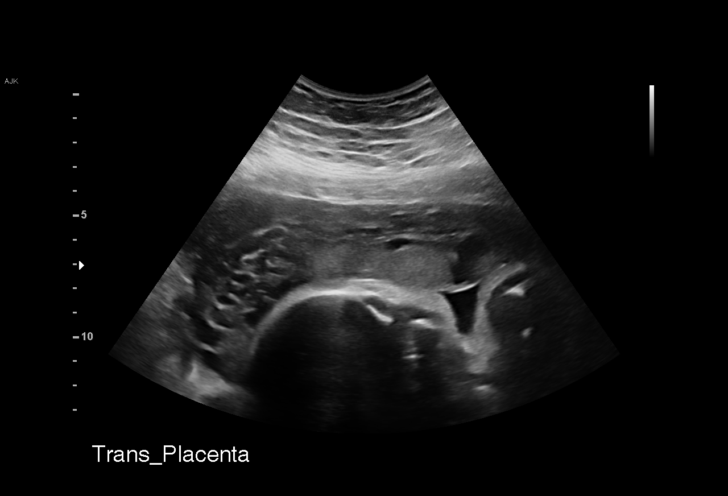
[im 24/35]
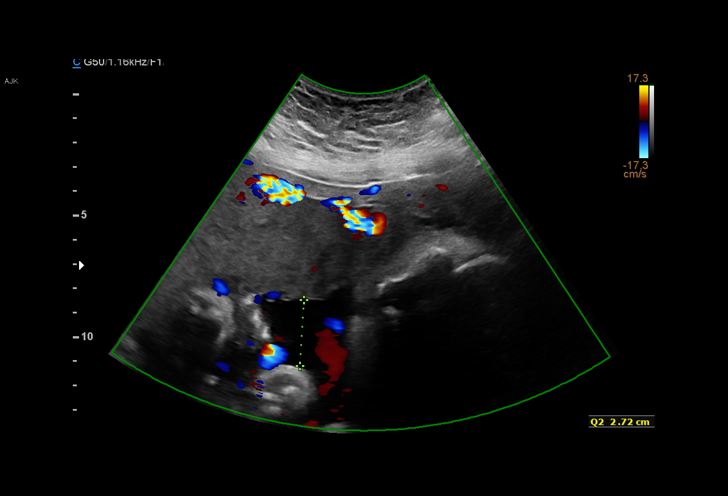
[im 27/35]
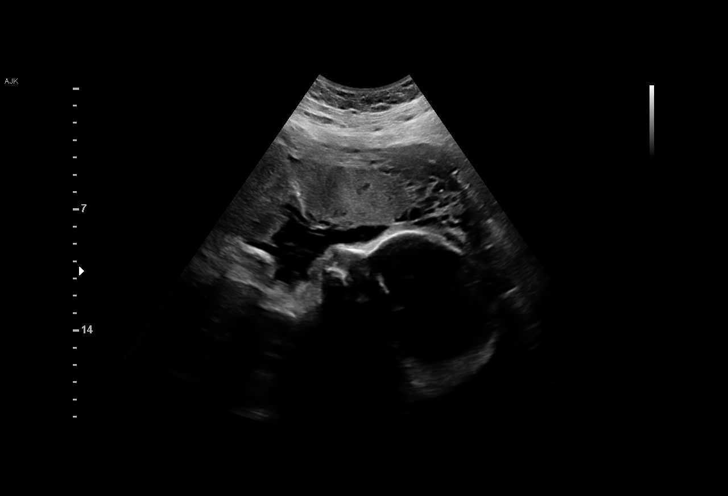
[im 29/35]
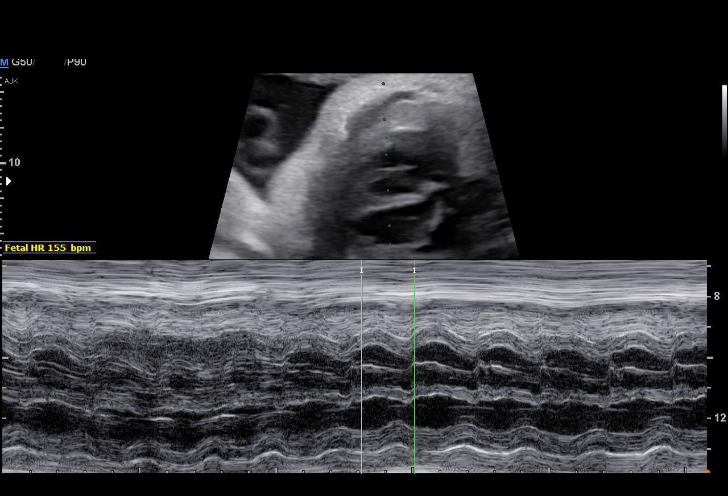
[im 32/35]
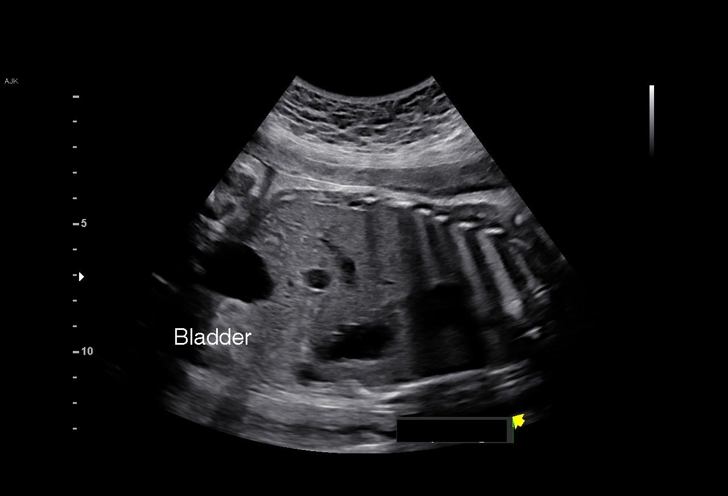
[im 35/35]
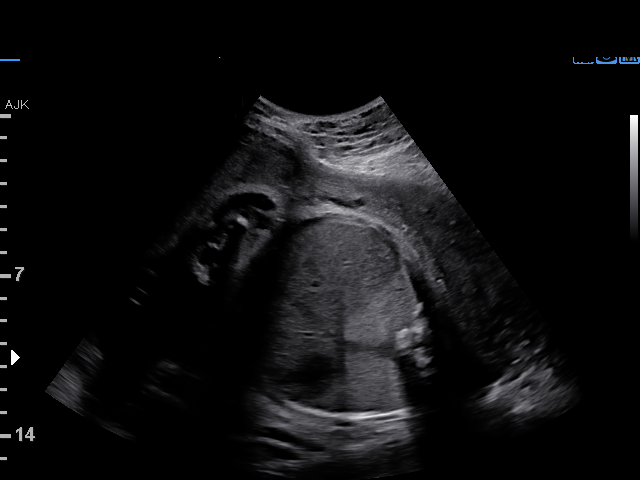

[15 of 28 positions shown; findings below may reference images not displayed]

----------------------------------------------------------------------

 ----------------------------------------------------------------------
Indications

  Anti-E isoimmunization affecting pregnancy
  in 3rd trimester
  Advanced maternal age multigravida 35+,
  third trimester
  Obesity complicating pregnancy, third
  trimester
  Hypothyroid
  32 weeks gestation of pregnancy
  Previous cesarean delivery, antepartum
 ----------------------------------------------------------------------
Vital Signs

                                                Height:        5'2"
Fetal Evaluation

 Num Of Fetuses:         1
 Fetal Heart Rate(bpm):  155
 Cardiac Activity:       Observed
 Presentation:           Cephalic
 Placenta:               Anterior
 P. Cord Insertion:      Visualized, central

 Amniotic Fluid
 AFI FV:      Within normal limits

 AFI Sum(cm)     %Tile       Largest Pocket(cm)
 10.88           23

 RUQ(cm)       RLQ(cm)       LUQ(cm)        LLQ(cm)


 Comment:    Stomach, bladder, diaphragm noted. 2VC- (absent NABIZADA) noted.
OB History

 Gravidity:    7         Term:   4         SAB:   1
 TOP:          1        Living:  4
Gestational Age

 LMP:           32w 0d        Date:  09/21/18                 EDD:   06/28/19
 Best:          32w 0d     Det. By:  LMP  (09/21/18)          EDD:   06/28/19
Doppler - Fetal Vessels

 Middle Cerebral Artery
                                                    PSV    MoM
                                                  (cm/s)
                                                      54

Comments

 This patient has been followed for isoimmunization and a
 fetus with a two-vessel cord.  She denies any problems since
 her last exam.
 The peak systolic velocity of the middle cerebral artery was
 1.23 multiples of the median for her gestational age,
 indicating that her fetus is not anemic at this time.
 There were no signs of fetal hydrops noted on today's exam.
 A biophysical profile performed today was [DATE].
 There was normal amniotic fluid noted on today's ultrasound
 exam.
 The patient was reassured by today's findings.
 A biophysical profile was scheduled in 1 week.  We will
 repeat middle cerebral artery Doppler studies in 2 weeks.

## 2021-01-30 ENCOUNTER — Telehealth: Payer: Self-pay

## 2021-01-30 NOTE — Telephone Encounter (Signed)
TC from [place] [facility] nurse regarding pt B/P pt had not took Rx B/P was 166/104. Nurse had pt take Rx and wait to retake B/P readig is now 134/73. Per nurse pt may need counseling due to family issues w/ older child and medication management on Hydrochlorothiazide pt she is out of Rx.  Can we send a refill it for her ? I see she needs appt  anyway for B/P check she was to F/U 1wk after PP was not able to .   Please advise

## 2021-01-31 ENCOUNTER — Ambulatory Visit: Payer: Medicaid Other

## 2021-02-02 IMAGING — US US MFM FETAL BPP W/O NON-STRESS
1 series · 13 of 28 positions shown · non-contrast
Comparison: none

[Series 1: us mfm fetal bpp w/o non-stress · 34 acquisitions, 13 frames shown]
[im 2/34]
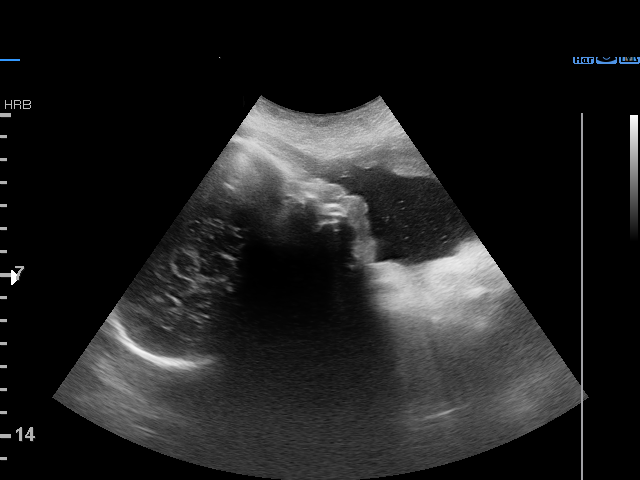
[im 4/34]
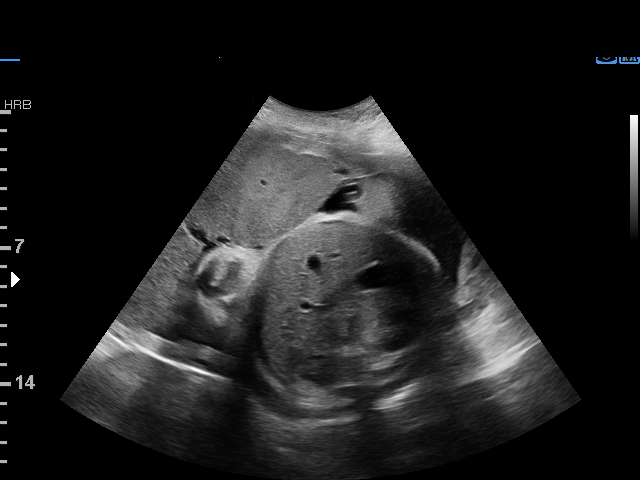
[im 7/34]
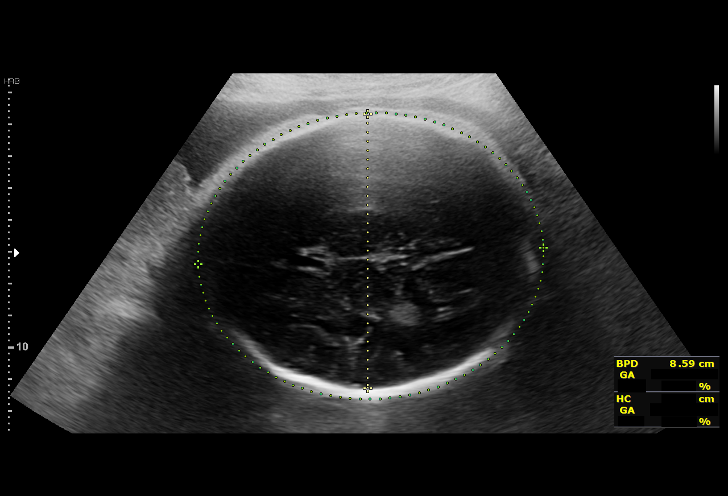
[im 9/34]
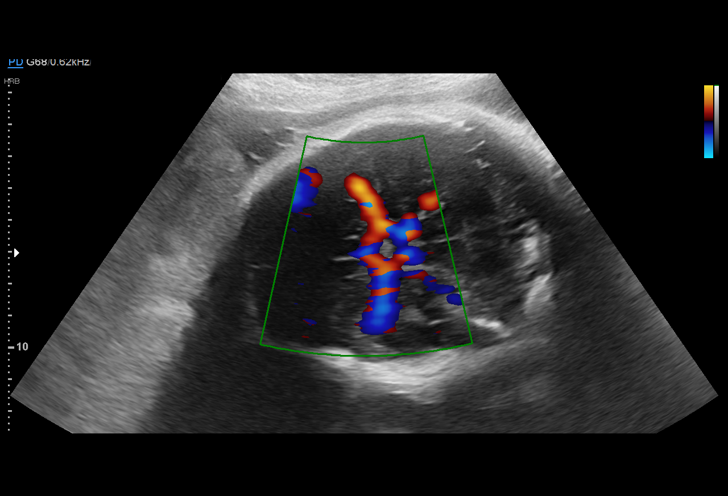
[im 12/34]
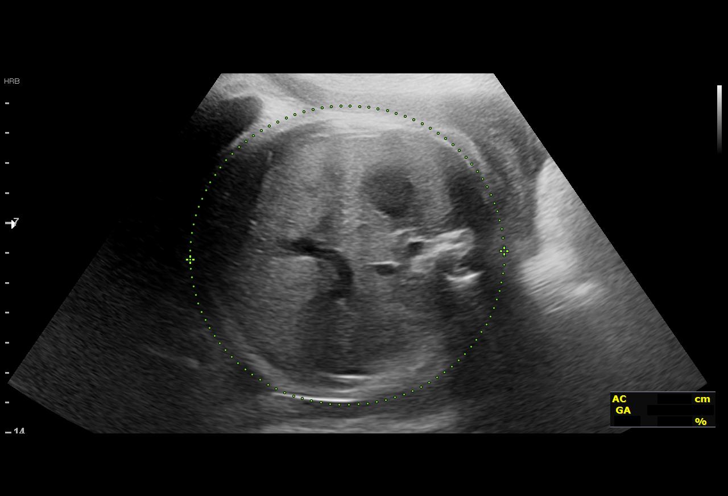
[im 14/34]
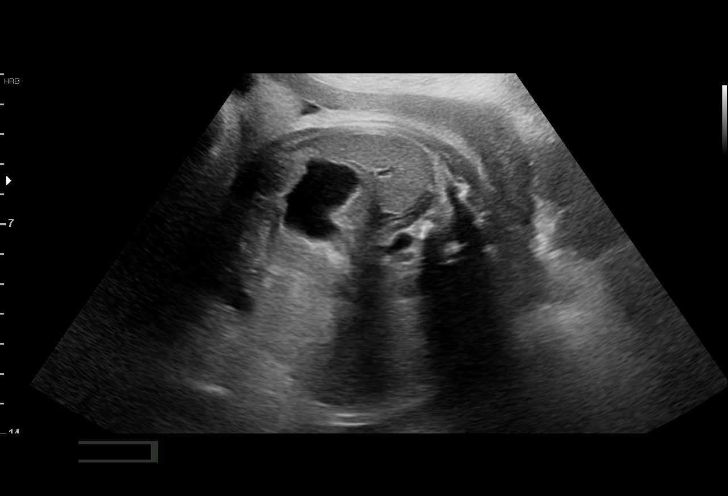
[im 18/34]
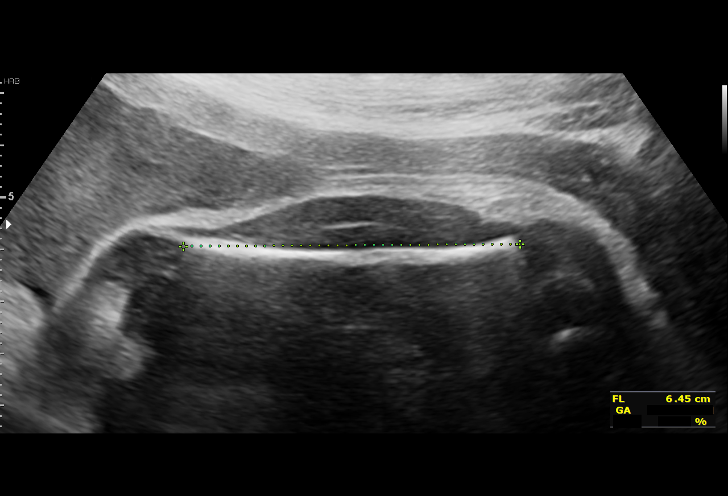
[im 20/34]
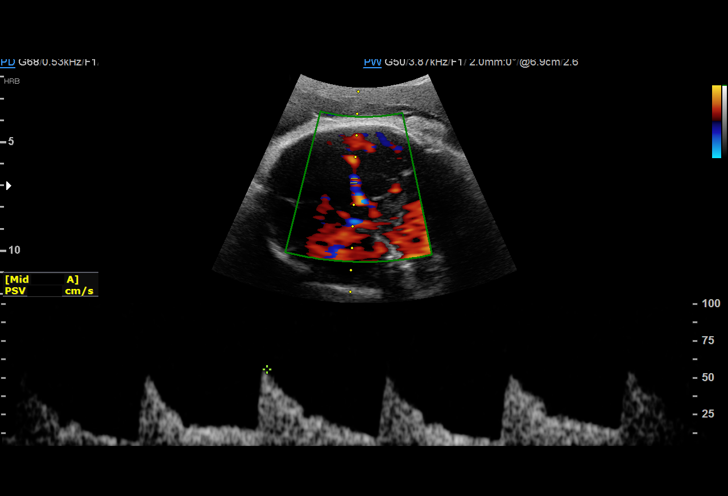
[im 23/34]
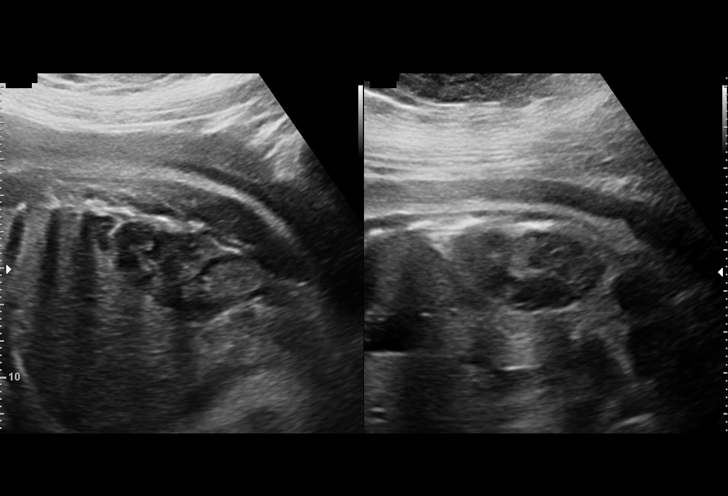
[im 25/34]
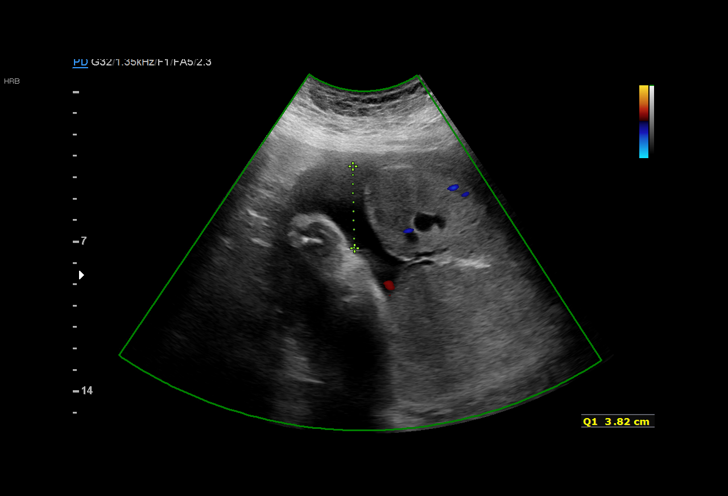
[im 27/34]
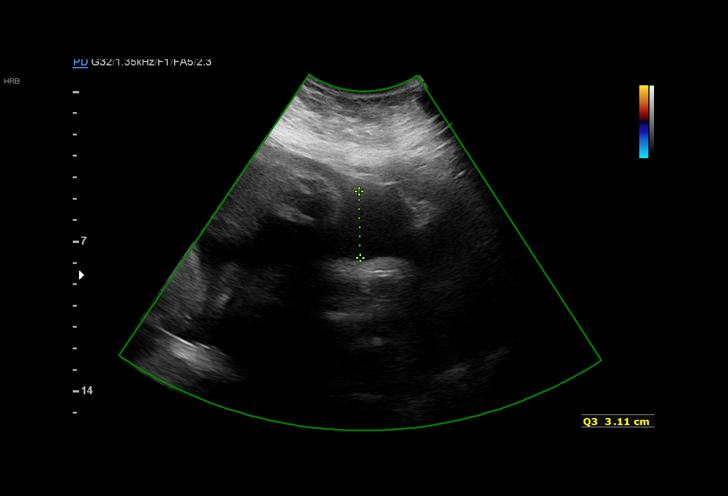
[im 30/34]
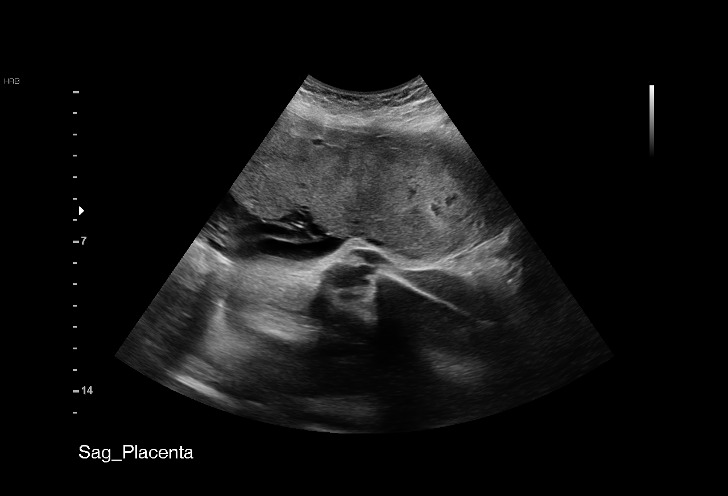
[im 32/34]
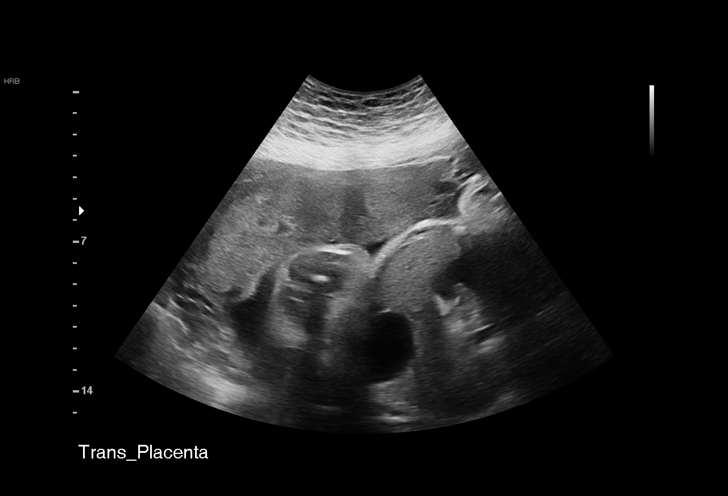

[13 of 28 positions shown; findings below may reference images not displayed]

2  US MFM MCA DOPPLER                   76821.01     ALUTIS MAIGIENE
 ----------------------------------------------------------------------

 ----------------------------------------------------------------------
Indications

  Anti-E isoimmunization affecting pregnancy
  in 3rd trimester
  Vaginal bleeding in pregnancy, third trimester
  Advanced maternal age multigravida 35+,
  third trimester
  Obesity complicating pregnancy, third
  trimester
  Hypothyroid
  Previous cesarean delivery, antepartum
  34 weeks gestation of pregnancy
 ----------------------------------------------------------------------
Vital Signs

 BMI:
Fetal Evaluation

 Num Of Fetuses:         1
 Fetal Heart Rate(bpm):  133
 Cardiac Activity:       Observed
 Presentation:           Breech
 Placenta:               Right lateral
 P. Cord Insertion:      Previously Visualized

 Amniotic Fluid
 AFI FV:      Within normal limits

 AFI Sum(cm)     %Tile       Largest Pocket(cm)
 10.67           24
 RUQ(cm)       RLQ(cm)       LUQ(cm)        LLQ(cm)
 3.82          3.74          0
Biophysical Evaluation

 Amniotic F.V:   Within normal limits       F. Tone:        Observed
 F. Movement:    Observed                   Score:          [DATE]
 F. Breathing:   Observed
Biometry

 BPD:      86.2  mm     G. Age:  34w 5d         70  %    CI:        78.42   %    70 - 86
                                                         FL/HC:      20.9   %    19.4 -
 HC:      307.9  mm     G. Age:  34w 2d         23  %    HC/AC:      0.96        0.96 -
 AC:      322.1  mm     G. Age:  36w 1d         96  %    FL/BPD:     74.7   %    71 - 87
 FL:       64.4  mm     G. Age:  33w 2d         21  %    FL/AC:      20.0   %    20 - 24

 Est. FW:    4794  gm    5 lb 11 oz      75  %
OB History

 Gravidity:    7         Term:   4         SAB:   1
 TOP:          1        Living:  4
Gestational Age

 LMP:           34w 0d        Date:  09/21/18                 EDD:   06/28/19
 U/S Today:     34w 4d                                        EDD:   06/24/19
 Best:          34w 0d     Det. By:  LMP  (09/21/18)          EDD:   06/28/19
Anatomy

 Cranium:               Appears normal         Aortic Arch:            Previously seen
 Cavum:                 Appears normal         Ductal Arch:            Previously seen
 Ventricles:            Previously seen        Diaphragm:              Previously seen
 Choroid Plexus:        Previously seen        Stomach:                Appears normal, left
                                                                       sided
 Cerebellum:            Previously seen        Abdomen:                Previously seen
 Posterior Fossa:       Previously seen        Abdominal Wall:         Previously seen
 Nuchal Fold:           Not applicable (>20    Cord Vessels:           2 Vessel Cord
                        wks GA)
 Face:                  Orbits and profile     Kidneys:                Appear normal
                        previously seen
 Lips:                  Previously seen        Bladder:                Appears normal
 Thoracic:              Appears normal         Spine:                  Previously seen
 Heart:                 Previously seen        Upper Extremities:      Previously seen
 RVOT:                  Previously seen        Lower Extremities:      Previously seen
 LVOT:                  Previously seen

 Other:  Fetus appears to be female. Heels visualized previously. 3VV and
         3VTV visualized previously. Hands visualized previously.
Doppler - Fetal Vessels

 Middle Cerebral Artery
                                                    PSV    MoM
                                                  (cm/s)

Cervix Uterus Adnexa

 Cervix
 Not visualized (advanced GA >21wks)
Comments

 This patient was seen for a follow up growth scan due to
 isoimmunization.  The patient was hospitalized 3 days ago
 due to vaginal bleeding and lower abdominal cramping.  She
 has received a complete course of antenatal corticosteroids.
 The patient has not experienced any further vaginal bleeding
 or lower abdominal cramping since she was admitted.  Her
 fetal testing has been reassuring while she has been in the
 hospital.
 The fetal growth and amniotic fluid level appears appropriate
 for her gestational age.
 A biophysical profile performed today was [DATE].
 The peak systolic velocity of the middle cerebral artery was
 less than 1.5 multiple of the median for her gestational age,
 indicating that her fetus is not anemic at this time.
 There were no signs of fetal hydrops noted on today's exam.
 As the patient has not experienced any further vaginal
 bleeding for the past 72 hours, she will be discharged home
 later today.
 She should continue to be followed in our office with weekly
 fetal testing and middle cerebral artery Doppler studies.
 She should have another MCA Doppler exam scheduled in 1
 week.

## 2021-02-05 ENCOUNTER — Ambulatory Visit: Payer: Medicaid Other

## 2021-02-07 ENCOUNTER — Other Ambulatory Visit: Payer: Self-pay

## 2021-02-07 ENCOUNTER — Ambulatory Visit (INDEPENDENT_AMBULATORY_CARE_PROVIDER_SITE_OTHER): Payer: Medicaid Other | Admitting: *Deleted

## 2021-02-07 VITALS — BP 162/74 | HR 64

## 2021-02-07 DIAGNOSIS — E039 Hypothyroidism, unspecified: Secondary | ICD-10-CM

## 2021-02-07 DIAGNOSIS — I10 Essential (primary) hypertension: Secondary | ICD-10-CM

## 2021-02-07 DIAGNOSIS — O1093 Unspecified pre-existing hypertension complicating the puerperium: Secondary | ICD-10-CM

## 2021-02-07 MED ORDER — LEVOTHYROXINE SODIUM 125 MCG PO TABS: 125.0000 ug | ORAL_TABLET | Freq: Every day | ORAL | 0 refills | Status: DC

## 2021-02-07 MED ORDER — HYDROCHLOROTHIAZIDE 25 MG PO TABS: 25.0000 mg | ORAL_TABLET | Freq: Every day | ORAL | 3 refills | Status: DC

## 2021-02-07 NOTE — Progress Notes (Signed)
Subjective:  Marisa Campbell is a 40 y.o. female here for BP check.   Hypertension ROS: not taking medications as instructed, no medication side effects noted, no TIA's, no chest pain on exertion, no dyspnea on exertion, and no swelling of ankles. Patient is out of HCTZ.   Objective:  BP 162/74 P 64 Appearance alert, well appearing, and in no distress, oriented to person, place, and time, and overweight. General exam BP noted to be not well controlled today in office.    Assessment:   Blood Pressure poorly controlled.   Plan:  The following changes are to be made: Increase Labetalol to 300mg  twice a day and refill HCTZ .

## 2021-02-21 ENCOUNTER — Ambulatory Visit: Payer: Medicaid Other

## 2021-02-27 ENCOUNTER — Ambulatory Visit: Payer: Medicaid Other

## 2021-02-27 ENCOUNTER — Ambulatory Visit (HOSPITAL_BASED_OUTPATIENT_CLINIC_OR_DEPARTMENT_OTHER): Payer: Medicaid Other | Admitting: Radiology

## 2021-03-05 ENCOUNTER — Ambulatory Visit (INDEPENDENT_AMBULATORY_CARE_PROVIDER_SITE_OTHER): Payer: Medicaid Other

## 2021-03-05 ENCOUNTER — Other Ambulatory Visit: Payer: Self-pay

## 2021-03-05 DIAGNOSIS — Z0130 Encounter for examination of blood pressure without abnormal findings: Secondary | ICD-10-CM

## 2021-03-05 NOTE — Progress Notes (Signed)
Subjective:  Marisa Campbell is a 40 y.o. female here for BP check.   Hypertension ROS: taking medications as instructed, no medication side effects noted, no TIA's, no chest pain on exertion, no dyspnea on exertion, and no swelling of ankles.    Objective:  There were no vitals taken for this visit.  Appearance alert, well appearing, and in no distress. General exam BP noted to be well controlled today in office.    Assessment:   Blood Pressure well controlled.   Plan:  Current treatment plan is effective, no change in therapy..Patient will need to find a PCP to manage blood pressure.

## 2021-03-05 NOTE — Progress Notes (Signed)
Patient was assessed and managed by nursing staff during this encounter. I have reviewed the chart and agree with the documentation and plan. I have also made any necessary editorial changes.  Catalina Antigua, MD 03/05/2021 4:02 PM

## 2021-04-30 ENCOUNTER — Ambulatory Visit (HOSPITAL_BASED_OUTPATIENT_CLINIC_OR_DEPARTMENT_OTHER): Admission: RE | Admit: 2021-04-30 | Payer: Medicaid Other | Source: Ambulatory Visit | Admitting: Radiology

## 2021-06-08 ENCOUNTER — Other Ambulatory Visit: Payer: Self-pay | Admitting: Obstetrics and Gynecology

## 2021-06-08 DIAGNOSIS — I10 Essential (primary) hypertension: Secondary | ICD-10-CM

## 2021-06-12 ENCOUNTER — Other Ambulatory Visit: Payer: Self-pay | Admitting: Obstetrics and Gynecology

## 2021-06-12 DIAGNOSIS — I10 Essential (primary) hypertension: Secondary | ICD-10-CM

## 2021-06-13 ENCOUNTER — Telehealth: Payer: Self-pay

## 2021-06-13 NOTE — Telephone Encounter (Addendum)
Attempted to call pt to inquire about PCP managing blood pressure and medications. Pt did not answer. No option to leave voicemail. MyChart message sent to pt.   ----- Message from Warden Fillers, MD sent at 06/12/2021  4:33 PM EST ----- Regarding: ? PCP and blood pressure Contact pt and ask does she have a PCP who is managing her blood pressure.  If she does not she will need a PCP to continue the HCTZ.  She continues to seen rx requests for the medication.

## 2021-06-21 ENCOUNTER — Ambulatory Visit: Payer: Medicaid Other | Admitting: Family Medicine

## 2021-06-26 ENCOUNTER — Ambulatory Visit (INDEPENDENT_AMBULATORY_CARE_PROVIDER_SITE_OTHER): Payer: Medicaid Other | Admitting: Family Medicine

## 2021-06-26 ENCOUNTER — Ambulatory Visit: Payer: Medicaid Other | Admitting: Family

## 2021-06-26 ENCOUNTER — Other Ambulatory Visit: Payer: Self-pay

## 2021-06-26 ENCOUNTER — Encounter: Payer: Self-pay | Admitting: Family Medicine

## 2021-06-26 VITALS — BP 129/87 | HR 59 | Temp 98.0°F | Resp 16 | Wt 292.6 lb

## 2021-06-26 DIAGNOSIS — Z8639 Personal history of other endocrine, nutritional and metabolic disease: Secondary | ICD-10-CM | POA: Diagnosis not present

## 2021-06-26 DIAGNOSIS — I10 Essential (primary) hypertension: Secondary | ICD-10-CM | POA: Diagnosis not present

## 2021-06-26 DIAGNOSIS — R7989 Other specified abnormal findings of blood chemistry: Secondary | ICD-10-CM | POA: Diagnosis not present

## 2021-06-26 DIAGNOSIS — Z6841 Body Mass Index (BMI) 40.0 and over, adult: Secondary | ICD-10-CM

## 2021-06-26 DIAGNOSIS — L918 Other hypertrophic disorders of the skin: Secondary | ICD-10-CM

## 2021-06-26 LAB — POCT GLYCOSYLATED HEMOGLOBIN (HGB A1C): Hemoglobin A1C: 5.2 % (ref 4.0–5.6)

## 2021-06-26 NOTE — Progress Notes (Signed)
New Patient Office Visit  Subjective:  Patient ID: Marisa Campbell, female    DOB: 1981-06-04  Age: 40 y.o. MRN: [id number]  CC:  Chief Complaint  Patient presents with   Follow-up   Hypertension    HPI Marisa Campbell presents for follow up of blood sugar and thyroid since having baby (approx 7 months ago). Patient also reports some skin tags between her buttocks and in her mons pubis area. She reports that they have just become evident over the past several months   Past Medical History:  Diagnosis Date   Anemia    COVID-19 01/31/2019   COVID-19 virus infection 02/01/2019   7/27, symptomatic   Gestational diabetes    Headache    History of cold sores 11/17/2020   HSV (herpes simplex virus) anogenital infection    oral    Hypothyroidism    on meds currently   Pregnancy induced hypertension    Vaginal Pap smear, abnormal     Past Surgical History:  Procedure Laterality Date   CESAREAN SECTION     CESAREAN SECTION N/A 06/01/2019   Procedure: CESAREAN SECTION;  Surgeon: Olga Millers, MD;  Location: [facility] LD ORS;  Service: Obstetrics;  Laterality: N/A;    Family History  Problem Relation Age of Onset   Hypertension Father     Social History   Socioeconomic History   Marital status: Single    Spouse name: Personal assistant   Number of children: Not on file   Years of education: Not on file   Highest education level: Not on file  Occupational History   Not on file  Tobacco Use   Smoking status: Never   Smokeless tobacco: Never  Vaping Use   Vaping Use: Never used  Substance and Sexual Activity   Alcohol use: No   Drug use: No   Sexual activity: Yes  Other Topics Concern   Not on file  Social History Narrative   Not on file   Social Determinants of Health   Financial Resource Strain: Not on file  Food Insecurity: No Food Insecurity   Worried About Running Out of Food in the Last Year: Never true   Ran Out of Food in the Last Year: Never true   Transportation Needs: Not on file  Physical Activity: Not on file  Stress: Not on file  Social Connections: Not on file  Intimate Partner Violence: Not on file    ROS Review of Systems  All other systems reviewed and are negative.  Objective:   Today's Vitals: BP 129/87    Pulse (!) 59    Temp 98 F (36.7 C) (Oral)    Resp 16    Wt 292 lb 9.6 oz (132.7 kg)    BMI 51.83 kg/m   Physical Exam Vitals and nursing note reviewed.  Constitutional:      General: She is not in acute distress.    Appearance: She is obese.  Cardiovascular:     Rate and Rhythm: Normal rate and regular rhythm.  Pulmonary:     Effort: Pulmonary effort is normal.     Breath sounds: Normal breath sounds.  Abdominal:     Palpations: Abdomen is soft.     Tenderness: There is no abdominal tenderness.  Skin:    Findings: Lesion present.     Comments: 2 lesions noted between her butt checks and a smaller lesion noted in the mons pubis area  Neurological:     General: No focal  deficit present.     Mental Status: She is alert and oriented to person, place, and time.    Assessment & Plan:   1. History of hyperglycemia A1c is wnl. Continue present management and monitor.   - POCT glycosylated hemoglobin (Hb A1C)  2. Abnormal thyroid blood test Monitoring labs ordered.  - TSH  3. Skin tag Referral to gyn for further eval/mgt  - Ambulatory referral to Gynecology  4. Essential hypertension Appears stable. Continue present management and monitor.   5. Class 3 severe obesity due to excess calories with serious comorbidity and body mass index (BMI) of 50.0 to 59.9 in adult [facility]) Discussed dietary and activity options.     Outpatient Encounter Medications as of 06/26/2021  Medication Sig   aspirin EC 81 MG tablet Take 81 mg by mouth daily. Swallow whole.   labetalol (NORMODYNE) 200 MG tablet Take 1 tablet by mouth twice daily   Blood Pressure Monitoring (BLOOD PRESSURE KIT) DEVI 1 kit by Does not  apply route once a week. Check Blood Pressure regularly and record readings into the Babys[employer]s App.  Large Cuff.  DX O90.0 (Patient taking differently: 1 kit by Does not apply route once a week. Check Blood Pressure regularly and record readings into the Babyscripts App.  Large Cuff.  DX O90.0)   hydrochlorothiazide (HYDRODIURIL) 25 MG tablet Take 1 tablet (25 mg total) by mouth daily.   hydrochlorothiazide (HYDRODIURIL) 25 MG tablet Take 1 tablet (25 mg total) by mouth daily.   ibuprofen (ADVIL) 600 MG tablet Take 1 tablet (600 mg total) by mouth every 6 (six) hours. (Patient not taking: Reported on 01/04/2021)   levothyroxine (SYNTHROID) 125 MCG tablet Take 1 tablet (125 mcg total) by mouth daily before breakfast.   Prenatal Vit-Fe Fumarate-FA (PRENATAL VITAMIN PO) Take by mouth.   No facility-administered encounter medications on file as of 06/26/2021.    Follow-up: No follow-ups on file.   Becky Sax, MD

## 2021-06-26 NOTE — Progress Notes (Signed)
Patient had GDM while in third trimester.Patient would just like to have A1C check again. Patient has a skin tag on groin area that she would like provider to look at.

## 2021-06-27 LAB — TSH: TSH: 1.03 u[IU]/mL (ref 0.450–4.500)

## 2021-07-05 ENCOUNTER — Other Ambulatory Visit: Payer: Self-pay | Admitting: Obstetrics and Gynecology

## 2021-07-05 DIAGNOSIS — E039 Hypothyroidism, unspecified: Secondary | ICD-10-CM

## 2021-07-12 ENCOUNTER — Other Ambulatory Visit: Payer: Self-pay | Admitting: *Deleted

## 2021-07-12 ENCOUNTER — Telehealth: Payer: Self-pay | Admitting: Family Medicine

## 2021-07-12 DIAGNOSIS — E039 Hypothyroidism, unspecified: Secondary | ICD-10-CM

## 2021-07-12 MED ORDER — LABETALOL HCL 200 MG PO TABS: 200.0000 mg | ORAL_TABLET | Freq: Two times a day (BID) | ORAL | 0 refills | Status: DC

## 2021-07-12 MED ORDER — LEVOTHYROXINE SODIUM 125 MCG PO TABS: 125.0000 ug | ORAL_TABLET | Freq: Every day | ORAL | 0 refills | Status: DC

## 2021-07-12 NOTE — Telephone Encounter (Signed)
MEDICATION HAS BEEN REFILLED

## 2021-07-12 NOTE — Telephone Encounter (Signed)
PT states she's been needing this since Monday but we were closed and has been having trouble locating [place] on the phone so she is really low on both now and is asking for an urgent refill for both   labetalol (NORMODYNE) 200 MG tablet [[id number]]   levothyroxine (SYNTHROID) 125 MCG tablet [[id number]]   Pharmacy  [facility] [address] [place] - [address]  [address] [place] ([place]) [place] [address]  Phone:  [phone]  Fax:  [phone]

## 2021-08-06 ENCOUNTER — Ambulatory Visit: Payer: Medicaid Other | Admitting: Obstetrics and Gynecology

## 2021-09-18 ENCOUNTER — Other Ambulatory Visit: Payer: Self-pay | Admitting: *Deleted

## 2021-09-18 ENCOUNTER — Telehealth: Payer: Self-pay | Admitting: Family Medicine

## 2021-09-18 MED ORDER — LABETALOL HCL 200 MG PO TABS: 200.0000 mg | ORAL_TABLET | Freq: Two times a day (BID) | ORAL | 0 refills | Status: DC

## 2021-09-18 NOTE — Telephone Encounter (Signed)
URGENT refill for  ?labetalol (NORMODYNE) 200 MG tablet [[id number]] ? ?Pharmacy ? ?[id number] [place] - [address]  ?[address], [place] [address]  ?Phone:  [phone]  Fax:  [phone]  ?DEA #:  --  ? ?

## 2021-09-25 ENCOUNTER — Ambulatory Visit (INDEPENDENT_AMBULATORY_CARE_PROVIDER_SITE_OTHER): Payer: Medicaid Other | Admitting: Family Medicine

## 2021-09-25 ENCOUNTER — Other Ambulatory Visit: Payer: Self-pay

## 2021-09-25 ENCOUNTER — Encounter: Payer: Self-pay | Admitting: Family Medicine

## 2021-09-25 VITALS — BP 153/83 | HR 66 | Temp 98.1°F | Resp 16 | Wt 293.8 lb

## 2021-09-25 DIAGNOSIS — E039 Hypothyroidism, unspecified: Secondary | ICD-10-CM | POA: Diagnosis not present

## 2021-09-25 DIAGNOSIS — M2550 Pain in unspecified joint: Secondary | ICD-10-CM | POA: Diagnosis not present

## 2021-09-25 DIAGNOSIS — I10 Essential (primary) hypertension: Secondary | ICD-10-CM | POA: Diagnosis not present

## 2021-09-25 DIAGNOSIS — R7989 Other specified abnormal findings of blood chemistry: Secondary | ICD-10-CM | POA: Diagnosis not present

## 2021-09-25 MED ORDER — HYDROCHLOROTHIAZIDE 25 MG PO TABS: 25.0000 mg | ORAL_TABLET | Freq: Every day | ORAL | 1 refills | Status: DC

## 2021-09-25 MED ORDER — LABETALOL HCL 200 MG PO TABS: 200.0000 mg | ORAL_TABLET | Freq: Two times a day (BID) | ORAL | 1 refills | Status: DC

## 2021-09-25 MED ORDER — LEVOTHYROXINE SODIUM 125 MCG PO TABS: 125.0000 ug | ORAL_TABLET | Freq: Every day | ORAL | 0 refills | Status: DC

## 2021-09-25 NOTE — Progress Notes (Signed)
? ?Established Patient Office Visit ? ?Subjective:  ?Patient ID: Marisa Campbell, female    DOB: 03/14/1981  Age: 41 y.o. MRN: [id number] ? ?CC:  ?Chief Complaint  ?Patient presents with  ? Follow-up  ? Hypertension  ? Hyperlipidemia  ? ? ?HPI ?Marisa Campbell presents for follow up of chronic med issues including hypertension. Patient also reports that she has been having persistent and worsening pain in her joints, particularly in her hands.  ? ?Past Medical History:  ?Diagnosis Date  ? Anemia   ? COVID-19 01/31/2019  ? COVID-19 virus infection 02/01/2019  ? 7/27, symptomatic  ? Gestational diabetes   ? Headache   ? History of cold sores 11/17/2020  ? HSV (herpes simplex virus) anogenital infection   ? oral   ? Hypothyroidism   ? on meds currently  ? Pregnancy induced hypertension   ? Vaginal Pap smear, abnormal   ? ? ?Past Surgical History:  ?Procedure Laterality Date  ? CESAREAN SECTION    ? CESAREAN SECTION N/A 06/01/2019  ? Procedure: CESAREAN SECTION;  Surgeon: Olga Millers, MD;  Location: [facility];  Service: Obstetrics;  Laterality: N/A;  ? ? ?Family History  ?Problem Relation Age of Onset  ? Hypertension Father   ? ? ?Social History  ? ?Socioeconomic History  ? Marital status: Single  ?  Spouse name: Rolm Gala  ? Number of children: Not on file  ? Years of education: Not on file  ? Highest education level: Not on file  ?Occupational History  ? Not on file  ?Tobacco Use  ? Smoking status: Never  ? Smokeless tobacco: Never  ?Vaping Use  ? Vaping Use: Never used  ?Substance and Sexual Activity  ? Alcohol use: No  ? Drug use: No  ? Sexual activity: Yes  ?Other Topics Concern  ? Not on file  ?Social History Narrative  ? Not on file  ? ?Social Determinants of Health  ? ?Financial Resource Strain: Not on file  ?Food Insecurity: No Food Insecurity  ? Worried About Charity fundraiser in the Last Year: Never true  ? Ran Out of Food in the Last Year: Never true  ?Transportation Needs: Not on file   ?Physical Activity: Not on file  ?Stress: Not on file  ?Social Connections: Not on file  ?Intimate Partner Violence: Not on file  ? ? ?ROS ?Review of Systems  ?All other systems reviewed and are negative. ? ?Objective:  ? ?Today's Vitals: BP (!) 153/83   Pulse 66   Temp 98.1 ?F (36.7 ?C) (Oral)   Resp 16   Wt 293 lb 12.8 oz (133.3 kg)   SpO2 94%   BMI 52.04 kg/m?  ? ?Physical Exam ?Vitals and nursing note reviewed.  ?Constitutional:   ?   General: She is not in acute distress. ?   Appearance: She is obese.  ?Cardiovascular:  ?   Rate and Rhythm: Normal rate and regular rhythm.  ?Pulmonary:  ?   Effort: Pulmonary effort is normal.  ?   Breath sounds: Normal breath sounds.  ?Abdominal:  ?   Palpations: Abdomen is soft.  ?   Tenderness: There is no abdominal tenderness.  ?Musculoskeletal:     ?   General: Tenderness (multiple joints of hands) present.  ?Neurological:  ?   General: No focal deficit present.  ?   Mental Status: She is alert and oriented to person, place, and time.  ? ? ?Assessment & Plan:  ? ?1.  Arthralgia, unspecified joint ?Monitoring labs ordered. ? ?- Sedimentation Rate ?- ANA ?- Rheumatoid factor ? ?2. Essential hypertension ?Elevated reading. Compliance discussed. Meds refilled ? ?- hydrochlorothiazide (HYDRODIURIL) 25 MG tablet; Take 1 tablet (25 mg total) by mouth daily.  Dispense: 90 tablet; Refill: 1 ? ?3. Hypothyroidism, unspecified type ?Monitoring labs ordered. Meds ordered. ? ?- levothyroxine (SYNTHROID) 125 MCG tablet; Take 1 tablet (125 mcg total) by mouth daily before breakfast.  Dispense: 90 tablet; Refill: 0 ? ? ? ? ? ?Outpatient Encounter Medications as of 09/25/2021  ?Medication Sig  ? aspirin EC 81 MG tablet Take 81 mg by mouth daily. Swallow whole.  ? Blood Pressure Monitoring (BLOOD PRESSURE KIT) DEVI 1 kit by Does not apply route once a week. Check Blood Pressure regularly and record readings into the Babyscripts App.  Large Cuff.  DX O90.0 (Patient taking differently: 1  kit by Does not apply route once a week. Check Blood Pressure regularly and record readings into the Babyscripts App.  Large Cuff.  DX O90.0)  ? labetalol (NORMODYNE) 200 MG tablet Take 1 tablet (200 mg total) by mouth 2 (two) times daily.  ? levothyroxine (SYNTHROID) 125 MCG tablet Take 1 tablet (125 mcg total) by mouth daily before breakfast.  ? Prenatal Vit-Fe Fumarate-FA (PRENATAL VITAMIN PO) Take by mouth.  ? ibuprofen (ADVIL) 600 MG tablet Take 1 tablet (600 mg total) by mouth every 6 (six) hours. (Patient not taking: Reported on 01/04/2021)  ? [DISCONTINUED] hydrochlorothiazide (HYDRODIURIL) 25 MG tablet Take 1 tablet (25 mg total) by mouth daily. (Patient not taking: Reported on 09/25/2021)  ? [DISCONTINUED] hydrochlorothiazide (HYDRODIURIL) 25 MG tablet Take 1 tablet (25 mg total) by mouth daily.  ? ?No facility-administered encounter medications on file as of 09/25/2021.  ? ? ?Follow-up: No follow-ups on file.  ? ?Becky Sax, MD ? ?

## 2021-09-25 NOTE — Progress Notes (Signed)
Patient is here for here 3 month follow-up. Patient due c/o possible having arthritis ih her fingers after last pregnancy.  ?

## 2021-09-26 ENCOUNTER — Encounter: Payer: Self-pay | Admitting: Family Medicine

## 2021-09-26 LAB — ANA: Anti Nuclear Antibody (ANA): NEGATIVE

## 2021-09-26 LAB — T4, FREE: Free T4: 1.3 ng/dL (ref 0.82–1.77)

## 2021-09-26 LAB — SEDIMENTATION RATE: Sed Rate: 14 mm/hr (ref 0–32)

## 2021-09-26 LAB — RHEUMATOID FACTOR: Rheumatoid fact SerPl-aCnc: 10.1 IU/mL (ref <14.0)

## 2021-09-26 LAB — TSH: TSH: 1.48 u[IU]/mL (ref 0.450–4.500)

## 2021-10-25 ENCOUNTER — Ambulatory Visit (INDEPENDENT_AMBULATORY_CARE_PROVIDER_SITE_OTHER): Payer: Medicaid Other | Admitting: Family Medicine

## 2021-10-25 VITALS — BP 119/78 | HR 61 | Temp 98.1°F | Resp 16 | Wt 293.0 lb

## 2021-10-25 DIAGNOSIS — N898 Other specified noninflammatory disorders of vagina: Secondary | ICD-10-CM

## 2021-10-25 MED ORDER — ESTRADIOL 0.1 MG/GM VA CREA: 1.0000 | TOPICAL_CREAM | Freq: Every day | VAGINAL | 0 refills | Status: DC

## 2021-10-25 NOTE — Progress Notes (Signed)
Patient is here with c/o not feeling herself after having her last child (11 month). Patient c/o hot flashes,fatigue, no libo, vaginal dryness (hurts), and trying to loss weight and can't. Patient has 41yo, 42yo, 42yo, 42 yo, 42yo,  and 52mo ? Patient said that she has never not been able to "snap" back after having babies. ?

## 2021-10-26 ENCOUNTER — Encounter: Payer: Self-pay | Admitting: Family Medicine

## 2021-10-26 NOTE — Progress Notes (Signed)
? ?Established Patient Office Visit ? ?Subjective   ? ?Patient ID: KETTY BITTON, female    DOB: September 23, 1980  Age: 41 y.o. MRN: [id number] ? ?CC: No chief complaint on file. ? ? ?HPI ?Waldemar Dickens presents with vaginal pain and dryness. 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  ? ? ?Outpatient Encounter Medications as of 10/25/2021  ?Medication Sig  ? aspirin EC 81 MG tablet Take 81 mg by mouth daily. Swallow whole.  ? Blood Pressure Monitoring (BLOOD PRESSURE KIT) DEVI 1 kit by Does not apply route once a week. Check Blood Pressure regularly and record readings into the Babyscripts App.  Large Cuff.  DX O90.0 (Patient taking differently: 1 kit by Does not apply route once a week. Check Blood Pressure regularly and record readings into the Babyscripts App.  Large Cuff.  DX O90.0)  ? estradiol (ESTRACE VAGINAL) 0.1 MG/GM vaginal cream Place 1 Applicatorful vaginally at bedtime.  ? hydrochlorothiazide (HYDRODIURIL) 25 MG tablet Take 1 tablet (25 mg total) by mouth daily.  ? ibuprofen (ADVIL) 600 MG tablet Take 1 tablet (600 mg total) by mouth every 6 (six) hours.  ? labetalol (NORMODYNE) 200 MG tablet Take 1 tablet (200 mg total) by mouth 2 (two) times daily.  ? levothyroxine (SYNTHROID) 125 MCG tablet Take 1 tablet (125 mcg total) by mouth daily before breakfast.  ? Prenatal Vit-Fe Fumarate-FA (PRENATAL VITAMIN PO) Take by mouth.  ? ?No facility-administered encounter medications on file as of 10/25/2021.  ? ? ?Past Medical History:  ?Diagnosis Date  ? Anemia   ? COVID-19 01/31/2019  ? COVID-19 virus infection 02/01/2019  ? 7/27, symptomatic  ? Gestational diabetes   ? Headache   ? History of cold sores 11/17/2020  ? HSV (herpes simplex virus) anogenital infection   ? oral   ? Hypothyroidism   ? on meds currently  ? Pregnancy induced hypertension   ? Vaginal Pap smear, abnormal   ? ? ?Past Surgical History:  ?Procedure Laterality Date  ? CESAREAN SECTION    ? CESAREAN SECTION N/A 06/01/2019  ?  Procedure: CESAREAN SECTION;  Surgeon: Olga Millers, MD;  Location: [facility];  Service: Obstetrics;  Laterality: N/A;  ? ? ?Family History  ?Problem Relation Age of Onset  ? Hypertension Father   ? ? ?Social History  ? ?Socioeconomic History  ? Marital status: Single  ?  Spouse name: Rolm Gala  ? Number of children: Not on file  ? Years of education: Not on file  ? Highest education level: Not on file  ?Occupational History  ? Not on file  ?Tobacco Use  ? Smoking status: Never  ? Smokeless tobacco: Never  ?Vaping Use  ? Vaping Use: Never used  ?Substance and Sexual Activity  ? Alcohol use: No  ? Drug use: No  ? Sexual activity: Yes  ?Other Topics Concern  ? Not on file  ?Social History Narrative  ? Not on file  ? ?Social Determinants of Health  ? ?Financial Resource Strain: Not on file  ?Food Insecurity: No Food Insecurity  ? Worried About Charity fundraiser in the Last Year: Never true  ? Ran Out of Food in the Last Year: Never true  ?Transportation Needs: Not on file  ?Physical Activity: Not on file  ?Stress: Not on file  ?Social Connections: Not on file  ?Intimate Partner Violence: Not on file  ? ? ?Review of Systems  ?All other systems reviewed and  are negative. ? ?  ? ? ?Objective   ? ?BP 119/78   Pulse 61   Temp 98.1 ?F (36.7 ?C) (Oral)   Resp 16   Wt 293 lb (132.9 kg)   SpO2 96%   BMI 51.90 kg/m?  ? ?Physical Exam ?Vitals and nursing note reviewed.  ?Constitutional:   ?   General: She is not in acute distress. ?Cardiovascular:  ?   Rate and Rhythm: Normal rate and regular rhythm.  ?Pulmonary:  ?   Effort: Pulmonary effort is normal.  ?   Breath sounds: Normal breath sounds.  ?Abdominal:  ?   Palpations: Abdomen is soft.  ?   Tenderness: There is no abdominal tenderness.  ?Neurological:  ?   General: No focal deficit present.  ?   Mental Status: She is alert and oriented to person, place, and time.  ? ? ? ?  ? ?Assessment & Plan:  ? ?1. Vaginal dryness ??2/2 nursing. Estrace vag cream  given. Keep scheduled appt with gyn for further eval/mgt ? ? ? ?No follow-ups on file.  ? ?Becky Sax, MD ? ? ?

## 2021-11-13 ENCOUNTER — Other Ambulatory Visit (HOSPITAL_COMMUNITY)
Admission: RE | Admit: 2021-11-13 | Discharge: 2021-11-13 | Disposition: A | Discharge status: Home / Self Care | Payer: Medicaid Other | Source: Ambulatory Visit | Attending: Obstetrics and Gynecology | Admitting: Obstetrics and Gynecology

## 2021-11-13 ENCOUNTER — Encounter: Payer: Self-pay | Admitting: Obstetrics and Gynecology

## 2021-11-13 ENCOUNTER — Ambulatory Visit (INDEPENDENT_AMBULATORY_CARE_PROVIDER_SITE_OTHER): Payer: Medicaid Other | Admitting: Obstetrics and Gynecology

## 2021-11-13 VITALS — BP 130/84 | HR 60 | Ht 63.0 in | Wt 293.0 lb

## 2021-11-13 DIAGNOSIS — K644 Residual hemorrhoidal skin tags: Secondary | ICD-10-CM | POA: Insufficient documentation

## 2021-11-13 NOTE — Progress Notes (Signed)
41 yo P6 presenting today requesting skin tag removal. Patient reports noticing these skin tags during her last pregnancy 3 years ago. She does not think that they have increased in size. She is interested in having them removed. They do not cause her to have pain ? ?Past Medical History:  ?Diagnosis Date  ? Anemia   ? COVID-19 01/31/2019  ? COVID-19 virus infection 02/01/2019  ? 7/27, symptomatic  ? Gestational diabetes   ? Headache   ? History of cold sores 11/17/2020  ? HSV (herpes simplex virus) anogenital infection   ? oral   ? Hypothyroidism   ? on meds currently  ? Pregnancy induced hypertension   ? Vaginal Pap smear, abnormal   ? ?Past Surgical History:  ?Procedure Laterality Date  ? CESAREAN SECTION    ? CESAREAN SECTION N/A 06/01/2019  ? Procedure: CESAREAN SECTION;  Surgeon: Levi Aland, MD;  Location: [facility];  Service: Obstetrics;  Laterality: N/A;  ? ?Family History  ?Problem Relation Age of Onset  ? Hypertension Father   ? ?Social History  ? ?Tobacco Use  ? Smoking status: Never  ? Smokeless tobacco: Never  ?Vaping Use  ? Vaping Use: Never used  ?Substance Use Topics  ? Alcohol use: No  ? Drug use: No  ? ?ROS ?See pertinent in HPI. All other systems reviewed and non contributory ?GENERAL: Well-developed, well-nourished female in no acute distress.  ?PELVIC: Normal external female genitalia. Two 0.5 mm kissing skin lesion on left and right buttocks. Vagina is pink and rugated.  Normal discharge.  Chaperone present during the pelvic exam ?EXTREMITIES: No cyanosis, clubbing, or edema, 2+ distal pulses. ?  ?A/P 41 yo here for skin lesion evaluation and removal ?- After informed consent was obtained and infiltration of 1% lidocaine, the skin lesions were removed with a scalpel. Hemostasis was achieved with 4.0 Vicryl. Patient tolerated the procedure well ?- Patient plans to return for annual exam with pap smear ?

## 2021-11-13 NOTE — Progress Notes (Signed)
Reports skin tags, approx 3 on "my bottom" and 2 "up front". ?

## 2021-11-14 ENCOUNTER — Other Ambulatory Visit: Payer: Self-pay | Admitting: Family Medicine

## 2021-11-14 DIAGNOSIS — E039 Hypothyroidism, unspecified: Secondary | ICD-10-CM

## 2021-11-14 LAB — SURGICAL PATHOLOGY

## 2021-12-17 ENCOUNTER — Ambulatory Visit: Payer: Medicaid Other | Admitting: Advanced Practice Midwife

## 2021-12-21 ENCOUNTER — Other Ambulatory Visit: Payer: Self-pay | Admitting: Family Medicine

## 2021-12-21 NOTE — Telephone Encounter (Signed)
Refused Normodyne 200 mg because requested too soon

## 2021-12-25 ENCOUNTER — Ambulatory Visit: Payer: Medicaid Other | Admitting: Advanced Practice Midwife

## 2021-12-25 NOTE — Progress Notes (Deleted)
   Subjective:     Marisa Campbell is a 41 y.o. female here at [facility] *** for a routine exam.  Current complaints: ***.  Personal health questionnaire reviewed: {yes/no:9010}.  Do you have a primary care provider? *** Do you feel safe at home? ***  Flowsheet Row Office Visit from 09/25/2021 in Primary Care at [facility] Total Score 0       Health Maintenance Due  Topic Date Due   MAMMOGRAM  Never done   PAP SMEAR-Modifier  Never done     Risk factors for chronic health problems: Smoking: Alchohol/how much: Pt BMI: There is no height or weight on file to calculate BMI.   Gynecologic History No LMP recorded. Contraception: {method:5051} Last Pap: ***. Results were: {norm/abn:16337} Last mammogram: ***. Results were: {norm/abn:16337}  Obstetric History OB History  Gravida Para Term Preterm AB Living  7 6 4 2 1 6   SAB IAB Ectopic Multiple Live Births  0 1   0 6    # Outcome Date GA Lbr Len/2nd Weight Sex Delivery Anes PTL Lv  7 Preterm 11/24/20 [redacted]w[redacted]d  3 lb 12.3 oz (1.71 kg) F Vag-Vacuum None  LIV  6 Preterm 06/01/19 [redacted]w[redacted]d  6 lb 6.5 oz (2.905 kg) F CS-LTranv Spinal  LIV  5 Term 11/08/17 [redacted]w[redacted]d    VBAC   LIV  4 IAB 2014          3 Term 2008 [redacted]w[redacted]d  9 lb (4.082 kg) M Vag-Spont   LIV  2 Term 2003 [redacted]w[redacted]d  9 lb 9 oz (4.338 kg) M Vag-Spont   LIV  1 Term 2000 [redacted]w[redacted]d  9 lb 6 oz (4.252 kg) M CS-LTranv   LIV     Complications: Failure to progress in labor     {Common ambulatory SmartLinks:19316}  Review of Systems {ros; complete:30496}    Objective:   There were no vitals taken for this visit. VS reviewed, nursing note reviewed,  Constitutional: well developed, well nourished, no distress HEENT: normocephalic CV: normal rate Pulm/chest wall: normal effort Breast Exam:  ***Deferred with low risks and shared decision making, discussed recommendation to start mammogram between 40-50 yo/ exam performed: right breast normal without mass, skin or nipple changes or  axillary nodes, left breast normal without mass, skin or nipple changes or axillary nodes Abdomen: soft Neuro: alert and oriented x 3 Skin: warm, dry Psych: affect normal Pelvic exam: ***Deferred/ Performed: Cervix pink, visually closed, without lesion, scant white creamy discharge, vaginal walls and external genitalia normal Bimanual exam: Cervix 0/long/high, firm, anterior, neg CMT, uterus nontender, nonenlarged, adnexa without tenderness, enlargement, or mass       Assessment/Plan:    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 2. Essential hypertension ***  3. Hypothyroidism, unspecified type ***  4. History of gestational diabetes ***  5. Well woman exam with routine gynecological exam ***    No follow-ups on file.   [redacted]w[redacted]d, CNM 8:29 AM

## 2022-01-03 ENCOUNTER — Other Ambulatory Visit: Payer: Self-pay | Admitting: Family Medicine

## 2022-01-07 ENCOUNTER — Ambulatory Visit: Payer: Medicaid Other | Admitting: Physician Assistant

## 2022-01-07 ENCOUNTER — Ambulatory Visit: Payer: Self-pay

## 2022-01-07 NOTE — Telephone Encounter (Signed)
   Chief Complaint: Severe sore throat. Children are sick as well. Symptoms: Sore throat, cough Frequency: Last week Pertinent Negatives: Patient denies fever Disposition: [] ED /[] Urgent Care (no appt availability in office) / [x] Appointment(In office/virtual)/ []  [facility]/ [] Home Care/ [] Refused Recommended Disposition /[] [facility]/ []  Follow-up with PCP Additional Notes:   Reason for Disposition  SEVERE (e.g., excruciating) throat pain  Answer Assessment - Initial Assessment Questions 1. ONSET: "When did the throat start hurting?" (Hours or days ago)      Last week 2. SEVERITY: "How bad is the sore throat?" (Scale 1-10; mild, moderate or severe)   - MILD (1-3):  doesn't interfere with eating or normal activities   - MODERATE (4-7): interferes with eating some solids and normal activities   - SEVERE (8-10):  excruciating pain, interferes with most normal activities   - SEVERE DYSPHAGIA: can't swallow liquids, drooling     Severe 3. STREP EXPOSURE: "Has there been any exposure to strep within the past week?" If Yes, ask: "What type of contact occurred?"      No 4.  VIRAL SYMPTOMS: "Are there any symptoms of a cold, such as a runny nose, cough, hoarse voice or red eyes?"      Cough 5. FEVER: "Do you have a fever?" If Yes, ask: "What is your temperature, how was it measured, and when did it start?"     None now 6. PUS ON THE TONSILS: "Is there pus on the tonsils in the back of your throat?"     No 7. OTHER SYMPTOMS: "Do you have any other symptoms?" (e.g., difficulty breathing, headache, rash)     Drainage from sinus 8. PREGNANCY: "Is there any chance you are pregnant?" "When was your last menstrual period?"     No  Protocols used: Sore Throat-A-AH

## 2022-01-09 ENCOUNTER — Encounter: Payer: Self-pay | Admitting: Family Medicine

## 2022-01-09 ENCOUNTER — Ambulatory Visit (INDEPENDENT_AMBULATORY_CARE_PROVIDER_SITE_OTHER): Payer: Medicaid Other | Admitting: Family Medicine

## 2022-01-09 VITALS — BP 142/83 | HR 65 | Temp 98.1°F | Resp 16 | Wt 295.8 lb

## 2022-01-09 DIAGNOSIS — B349 Viral infection, unspecified: Secondary | ICD-10-CM

## 2022-01-09 DIAGNOSIS — E039 Hypothyroidism, unspecified: Secondary | ICD-10-CM

## 2022-01-09 DIAGNOSIS — J029 Acute pharyngitis, unspecified: Secondary | ICD-10-CM

## 2022-01-09 DIAGNOSIS — I10 Essential (primary) hypertension: Secondary | ICD-10-CM | POA: Diagnosis not present

## 2022-01-09 MED ORDER — LABETALOL HCL 200 MG PO TABS: 200.0000 mg | ORAL_TABLET | Freq: Two times a day (BID) | ORAL | 1 refills | Status: DC

## 2022-01-09 MED ORDER — LEVOTHYROXINE SODIUM 125 MCG PO TABS: 125.0000 ug | ORAL_TABLET | Freq: Every day | ORAL | 1 refills | Status: DC

## 2022-01-09 NOTE — Progress Notes (Unsigned)
Patient has had sore throat x 4 days along with swelling. Patient said her daughter is also sick and she may have gotten it from her

## 2022-01-09 NOTE — Progress Notes (Unsigned)
New Patient Office Visit  Subjective    Patient ID: Marisa Campbell, female    DOB: 1980-08-25  Age: 41 y.o. MRN: [id number]  CC: No chief complaint on file.   HPI Marisa Campbell presents to establish care ***  Outpatient Encounter Medications as of 01/09/2022  Medication Sig   aspirin EC 81 MG tablet Take 81 mg by mouth daily. Swallow whole.   Blood Pressure Monitoring (BLOOD PRESSURE KIT) DEVI 1 kit by Does not apply route once a week. Check Blood Pressure regularly and record readings into the Babyscripts App.  Large Cuff.  DX O90.0 (Patient taking differently: 1 kit by Does not apply route once a week. Check Blood Pressure regularly and record readings into the Babyscripts App.  Large Cuff.  DX O90.0)   estradiol (ESTRACE VAGINAL) 0.1 MG/GM vaginal cream Place 1 Applicatorful vaginally at bedtime.   hydrochlorothiazide (HYDRODIURIL) 25 MG tablet Take 1 tablet (25 mg total) by mouth daily.   ibuprofen (ADVIL) 600 MG tablet Take 1 tablet (600 mg total) by mouth every 6 (six) hours.   labetalol (NORMODYNE) 200 MG tablet Take 1 tablet (200 mg total) by mouth 2 (two) times daily.   levothyroxine (SYNTHROID) 125 MCG tablet Take 1 tablet (125 mcg total) by mouth daily before breakfast.   Prenatal Vit-Fe Fumarate-FA (PRENATAL VITAMIN PO) Take by mouth.   [DISCONTINUED] labetalol (NORMODYNE) 200 MG tablet Take 1 tablet (200 mg total) by mouth 2 (two) times daily.   [DISCONTINUED] levothyroxine (SYNTHROID) 125 MCG tablet TAKE 1 TABLET BY MOUTH ONCE DAILY BEFORE BREAKFAST   No facility-administered encounter medications on file as of 01/09/2022.    Past Medical History:  Diagnosis Date   Anemia    COVID-19 01/31/2019   COVID-19 virus infection 02/01/2019   7/27, symptomatic   Gestational diabetes    Headache    History of cold sores 11/17/2020   HSV (herpes simplex virus) anogenital infection    oral    Hypothyroidism    on meds currently   Pregnancy induced hypertension     Vaginal Pap smear, abnormal     Past Surgical History:  Procedure Laterality Date   CESAREAN SECTION     CESAREAN SECTION N/A 06/01/2019   Procedure: CESAREAN SECTION;  Surgeon: Olga Millers, MD;  Location: [facility];  Service: Obstetrics;  Laterality: N/A;    Family History  Problem Relation Age of Onset   Hypertension Father     Social History   Socioeconomic History   Marital status: Single    Spouse name: Personal assistant   Number of children: Not on file   Years of education: Not on file   Highest education level: Not on file  Occupational History   Not on file  Tobacco Use   Smoking status: Never   Smokeless tobacco: Never  Vaping Use   Vaping Use: Never used  Substance and Sexual Activity   Alcohol use: No   Drug use: No   Sexual activity: Yes  Other Topics Concern   Not on file  Social History Narrative   Not on file   Social Determinants of Health   Financial Resource Strain: Not on file  Food Insecurity: No Food Insecurity (10/25/2020)   Hunger Vital Sign    Worried About Running Out of Food in the Last Year: Never true    Ran Out of Food in the Last Year: Never true  Transportation Needs: Not on file  Physical Activity: Not on file  Stress: Not on file  Social Connections: Not on file  Intimate Partner Violence: Not on file    ROS      Objective    BP (!) 142/83   Pulse 65   Temp 98.1 F (36.7 C) (Oral)   Resp 16   Wt 295 lb 12.8 oz (134.2 kg)   BMI 52.40 kg/m   Physical Exam  {Labs (Optional):23779}    Assessment & Plan:   Problem List Items Addressed This Visit       Endocrine   Hypothyroidism   Relevant Medications   labetalol (NORMODYNE) 200 MG tablet   levothyroxine (SYNTHROID) 125 MCG tablet    No follow-ups on file.   Becky Sax, MD

## 2022-01-10 ENCOUNTER — Encounter: Payer: Self-pay | Admitting: Family Medicine

## 2022-01-10 LAB — COVID-19, FLU A+B AND RSV
Influenza A, NAA: NOT DETECTED
Influenza B, NAA: NOT DETECTED
RSV, NAA: NOT DETECTED
SARS-CoV-2, NAA: NOT DETECTED

## 2022-01-10 LAB — POCT RAPID STREP A (OFFICE): Rapid Strep A Screen: NEGATIVE

## 2022-01-12 LAB — CULTURE, GROUP A STREP: Strep A Culture: NEGATIVE

## 2022-02-19 ENCOUNTER — Ambulatory Visit: Payer: Medicaid Other | Admitting: Advanced Practice Midwife

## 2022-03-16 ENCOUNTER — Other Ambulatory Visit: Payer: Self-pay | Admitting: Family Medicine

## 2022-03-16 DIAGNOSIS — I10 Essential (primary) hypertension: Secondary | ICD-10-CM

## 2022-04-11 ENCOUNTER — Other Ambulatory Visit (HOSPITAL_COMMUNITY)
Admission: RE | Admit: 2022-04-11 | Discharge: 2022-04-11 | Disposition: A | Discharge status: Home / Self Care | Payer: Medicaid Other | Source: Ambulatory Visit | Attending: Advanced Practice Midwife | Admitting: Advanced Practice Midwife

## 2022-04-11 ENCOUNTER — Ambulatory Visit (INDEPENDENT_AMBULATORY_CARE_PROVIDER_SITE_OTHER): Payer: Medicaid Other

## 2022-04-11 VITALS — BP 121/79 | HR 58 | Ht 62.5 in | Wt 298.3 lb

## 2022-04-11 DIAGNOSIS — Z01419 Encounter for gynecological examination (general) (routine) without abnormal findings: Secondary | ICD-10-CM | POA: Insufficient documentation

## 2022-04-11 DIAGNOSIS — N926 Irregular menstruation, unspecified: Secondary | ICD-10-CM | POA: Diagnosis not present

## 2022-04-11 DIAGNOSIS — Z1239 Encounter for other screening for malignant neoplasm of breast: Secondary | ICD-10-CM

## 2022-04-11 DIAGNOSIS — Z124 Encounter for screening for malignant neoplasm of cervix: Secondary | ICD-10-CM | POA: Diagnosis not present

## 2022-04-11 LAB — POCT URINE PREGNANCY: Preg Test, Ur: NEGATIVE

## 2022-04-11 NOTE — Progress Notes (Signed)
GYNECOLOGY OFFICE VISIT NOTE-WELL WOMAN EXAM  History:   SHANICQUA COLDREN [id number] here today for annual exam. She reports that her menses is late as it is usually every 3 weeks. However, she states her menstrual cycle did start today, but she requests a UPT to r/o pregnancy.  Patient expresses desire for pregnancy and questions when she should be concerned.   Birth Control:  None, Does not desire  Reproductive Concerns Sexually Active: Yes Partners Type: Female Number of partners in last year: One STD Testing: Declines  Vaginal/GU Concerns: She reports a history of abnormal pap smears 2018 and 2019.  She reports she had a normal pap smear in 2021, but can't recall. She denies abnormal vaginal discharge, bleeding, pelvic pain or concerns during sexual activity.   Breast Concerns/Exams: She is currently breastfeeding. She denies issues with her breast and does not check them. She also denies a family history of breast, uterine, cervical, or ovarian cancer  Medical and Nutrition PCP: [facility]-Last Appt 2 Months Agoa Significant Pmx: CHTN-Taking HCTZ Exercise: None Tobacco/Drugs/Alcohol: Denies Nutrition:Endorses balanced intake  Social Safety at home: Endorses; Lives with self, 4 kids, and husband. 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  Past Medical History:  Diagnosis Date   Anemia    COVID-19 01/31/2019   COVID-19 virus infection 02/01/2019   7/27, symptomatic   Gestational diabetes    Headache    History of cold sores 11/17/2020   HSV (herpes simplex virus) anogenital infection    oral    Hypothyroidism    on meds currently   Pregnancy induced hypertension    Vaginal Pap smear, abnormal     Past Surgical History:  Procedure Laterality Date   CESAREAN SECTION     CESAREAN SECTION N/A 06/01/2019   Procedure: CESAREAN SECTION;  Surgeon: Olga Millers, MD;  Location: [facility];  Service:  Obstetrics;  Laterality: N/A;    The following portions of the patient's history were reviewed and updated as appropriate: allergies, current medications, past family history, past medical history, past social history, past surgical history and problem list.   Health Maintenance:  No pap on file.  No mammogram on file.   Review of Systems:  Pertinent items noted in HPI and remainder of comprehensive ROS otherwise negative.    Objective:    Physical Exam BP 121/79   Pulse (!) 58   Ht 5' 2.5" (1.588 m)   Wt 298 lb 4.8 oz (135.3 kg)   LMP 04/11/2022 (Exact Date)   BMI 53.69 kg/m  Physical Exam Vitals reviewed. Exam conducted with a chaperone present.  Constitutional:      Appearance: Normal appearance.  HENT:     Head: Normocephalic and atraumatic.  Eyes:     Conjunctiva/sclera: Conjunctivae normal.  Cardiovascular:     Rate and Rhythm: Normal rate.  Genitourinary:    Comments: NEFG. Pink rugae. Cervix with small amt blood c/w menses. No active bleeding.  Pap collected with brush and spatula. Uterine size difficult to assess d/t body habitus.  No apparent tenderness.  Musculoskeletal:        General: Normal range of motion.     Cervical back: Normal range of motion.  Skin:    General: Skin is warm and dry.  Neurological:     Mental Status: She is alert and oriented to person, place, and time.  Psychiatric:        Mood and Affect: Mood normal.  Behavior: Behavior normal.      Labs and Imaging No results found for this or any previous visit (from the past 168 hour(s)). No results found.   Assessment & Plan:  41 year old Female Annual Exam Mammogram Due Breastfeeding Late Menses Desires Conception  1. Women's annual routine gynecological examination -Exam performed and findings discussed. -Encouraged to activate and utilize Mychart for reviewing of results, communication with office, and scheduling of appts. -Educated on AHA exercise recommendations of 30  minutes of moderate to vigorous activity at least 5x/week.  2. Pap smear for cervical cancer screening -Educated on ASCCP guidelines regarding pap smear evaluation and frequency. -Informed of turnover time and provider/clinic policy on releasing results.  3. Screening breast examination -Educated and encouraged to initiate SBE with increased breast awareness including examination of breast for skin changes, moles, tenderness, etc.  -Order placed for Mammogram Screening  4. Late Menses -UPT Negative today -Encouraged to test as necessary. -Discussed getting infertility referral if no conception after 6 months of actively trying.  -Reviewed risk factors (age) and co-morbidities (obesity and HTN) that may contribute to inability or difficulty conceiving. -Briefly reviewed pre-menopausal symptoms.   Routine preventative health maintenance measures emphasized. Please refer to After Visit Summary for other counseling recommendations.   No follow-ups on file.      Cherre Robins, CNM 04/11/2022

## 2022-04-11 NOTE — Progress Notes (Signed)
Patient presents for annual exam. Pt reports light spotting for the past 3 days that is abnormal from her usual period. Requests UPT. Pt has not had a PAP recently and has not had a mammogram. Denies any abnormal discharge or odor. Pt is currently breastfeeding. No concerns at this time.

## 2022-04-18 ENCOUNTER — Other Ambulatory Visit: Payer: Self-pay | Admitting: Family Medicine

## 2022-04-18 DIAGNOSIS — I10 Essential (primary) hypertension: Secondary | ICD-10-CM

## 2022-04-19 LAB — CYTOLOGY - PAP
Comment: NEGATIVE
Diagnosis: NEGATIVE
High risk HPV: NEGATIVE

## 2022-05-09 ENCOUNTER — Ambulatory Visit: Payer: Medicaid Other

## 2022-05-28 ENCOUNTER — Other Ambulatory Visit: Payer: Self-pay | Admitting: *Deleted

## 2022-05-28 ENCOUNTER — Telehealth: Payer: Self-pay | Admitting: Family Medicine

## 2022-05-28 DIAGNOSIS — I10 Essential (primary) hypertension: Secondary | ICD-10-CM

## 2022-05-28 MED ORDER — HYDROCHLOROTHIAZIDE 25 MG PO TABS: 25.0000 mg | ORAL_TABLET | Freq: Every day | ORAL | 0 refills | Status: DC

## 2022-05-28 NOTE — Telephone Encounter (Signed)
DONE

## 2022-05-28 NOTE — Telephone Encounter (Signed)
Requesting medication refill for   hydrochlorothiazide (HYDRODIURIL) 25 MG tablet [[id number]]   Pharmacy of choice:   [facility] [address] ([address] [place] - [address] [place] ([place]) [place] [address] Phone: [phone]  Fax: [phone]

## 2022-06-18 ENCOUNTER — Ambulatory Visit
Admission: RE | Admit: 2022-06-18 | Discharge: 2022-06-18 | Disposition: A | Discharge status: Home / Self Care | Payer: Medicaid Other | Source: Ambulatory Visit

## 2022-06-18 DIAGNOSIS — Z1239 Encounter for other screening for malignant neoplasm of breast: Secondary | ICD-10-CM

## 2022-06-18 DIAGNOSIS — Z01419 Encounter for gynecological examination (general) (routine) without abnormal findings: Secondary | ICD-10-CM

## 2022-07-12 ENCOUNTER — Other Ambulatory Visit: Payer: Self-pay | Admitting: Family Medicine

## 2022-07-12 DIAGNOSIS — E039 Hypothyroidism, unspecified: Secondary | ICD-10-CM

## 2022-07-12 MED ORDER — LEVOTHYROXINE SODIUM 125 MCG PO TABS: 125.0000 ug | ORAL_TABLET | Freq: Every day | ORAL | 0 refills | Status: DC

## 2022-07-12 MED ORDER — LABETALOL HCL 200 MG PO TABS: 200.0000 mg | ORAL_TABLET | Freq: Two times a day (BID) | ORAL | 0 refills | Status: DC

## 2022-07-12 NOTE — Telephone Encounter (Signed)
Medication Refill - Medication: levothyroxine (SYNTHROID) 125 MCG tablet   labetalol (NORMODYNE) 200 MG tablet   Has the patient contacted their pharmacy? Yes.   (Agent: If no, request that the patient contact the pharmacy for the refill. If patient does not wish to contact the pharmacy document the reason why and proceed with request.) (Agent: If yes, when and what did the pharmacy advise?)  Preferred Pharmacy (with phone number or street name):  [place] ([address] [place] [address]  [address] [place]) [place] [address]  Phone: [phone] Fax: [phone]   Has the patient been seen for an appointment in the last year OR does the patient have an upcoming appointment? Yes.    Agent: Please be advised that RX refills may take up to 3 business days. We ask that you follow-up with your pharmacy.

## 2022-07-12 NOTE — Telephone Encounter (Signed)
Called pt and made a follow up appt.

## 2022-07-12 NOTE — Telephone Encounter (Signed)
Requested Prescriptions  Pending Prescriptions Disp Refills   labetalol (NORMODYNE) 200 MG tablet 180 tablet 0    Sig: Take 1 tablet (200 mg total) by mouth 2 (two) times daily.     Cardiovascular:  Beta Blockers Failed - 07/12/2022 11:10 AM      Failed - Valid encounter within last 6 months    Recent Outpatient Visits           6 months ago Acute pharyngitis, unspecified etiology   Primary Care at [facility], MD   8 months ago Vaginal dryness   Primary Care at [facility], MD   9 months ago Arthralgia, unspecified joint   [facility], MD   1 year ago History of hyperglycemia   Primary Care at [facility], MD   2 years ago Abdominal cramping   Primary Care at [facility], MD       Future Appointments             In 1 week Dorna Mai, MD Primary Care at [facility] - Last BP in normal range    BP Readings from Last 1 Encounters:  04/11/22 121/79         Passed - Last Heart Rate in normal range    Pulse Readings from Last 1 Encounters:  04/11/22 (!) 58          levothyroxine (SYNTHROID) 125 MCG tablet 90 tablet 0    Sig: Take 1 tablet (125 mcg total) by mouth daily before breakfast.     Endocrinology:  Hypothyroid Agents Passed - 07/12/2022 11:10 AM      Passed - TSH in normal range and within 360 days    TSH  Date Value Ref Range Status  09/25/2021 1.480 0.450 - 4.500 uIU/mL Final         Passed - Valid encounter within last 12 months    Recent Outpatient Visits           6 months ago Acute pharyngitis, unspecified etiology   Primary Care at [facility], MD   8 months ago Vaginal dryness   Primary Care at [facility], MD   9 months ago Arthralgia, unspecified joint   Primary Care at [facility], MD   1 year ago History of hyperglycemia   Primary Care at [facility], MD   2 years ago Abdominal cramping   Primary Care at [facility], MD       Future Appointments             In 1 week Dorna Mai, MD Primary Care at [facility]            Patient must keep upcoming appointment for further refills.

## 2022-07-25 ENCOUNTER — Ambulatory Visit (INDEPENDENT_AMBULATORY_CARE_PROVIDER_SITE_OTHER): Payer: Medicaid Other | Admitting: Family Medicine

## 2022-07-25 VITALS — BP 119/79 | HR 62 | Temp 98.1°F | Resp 16 | Wt 304.0 lb

## 2022-07-25 DIAGNOSIS — F419 Anxiety disorder, unspecified: Secondary | ICD-10-CM | POA: Diagnosis not present

## 2022-07-25 DIAGNOSIS — E039 Hypothyroidism, unspecified: Secondary | ICD-10-CM

## 2022-07-25 DIAGNOSIS — I10 Essential (primary) hypertension: Secondary | ICD-10-CM

## 2022-07-25 DIAGNOSIS — F32A Depression, unspecified: Secondary | ICD-10-CM

## 2022-07-25 MED ORDER — SERTRALINE HCL 50 MG PO TABS: 50.0000 mg | ORAL_TABLET | Freq: Every day | ORAL | 0 refills | Status: DC

## 2022-07-25 MED ORDER — HYDROCHLOROTHIAZIDE 25 MG PO TABS: 25.0000 mg | ORAL_TABLET | Freq: Every day | ORAL | 1 refills | Status: DC

## 2022-07-25 NOTE — Progress Notes (Signed)
Patient is here for their 6 month follow-up Patient has no concerns today Care gaps have been discussed with patient  

## 2022-07-29 ENCOUNTER — Encounter: Payer: Self-pay | Admitting: Family Medicine

## 2022-07-29 NOTE — Progress Notes (Signed)
Established Patient Office Visit  Subjective    Patient ID: Marisa Campbell, female    DOB: 18-Aug-1980  Age: 42 y.o. MRN: [id number]  CC:  Chief Complaint  Patient presents with   Follow-up    HPI Marisa Campbell presents for routine follow up of chronic med issues.    Outpatient Encounter Medications as of 07/25/2022  Medication Sig   aspirin EC 81 MG tablet Take 81 mg by mouth daily. Swallow whole.   estradiol (ESTRACE VAGINAL) 0.1 MG/GM vaginal cream Place 1 Applicatorful vaginally at bedtime.   labetalol (NORMODYNE) 200 MG tablet Take 1 tablet (200 mg total) by mouth 2 (two) times daily.   levothyroxine (SYNTHROID) 125 MCG tablet Take 1 tablet (125 mcg total) by mouth daily before breakfast.   Prenatal Vit-Fe Fumarate-FA (PRENATAL VITAMIN PO) Take by mouth.   sertraline (ZOLOFT) 50 MG tablet Take 1 tablet (50 mg total) by mouth daily.   [DISCONTINUED] hydrochlorothiazide (HYDRODIURIL) 25 MG tablet Take 1 tablet (25 mg total) by mouth daily.   Blood Pressure Monitoring (BLOOD PRESSURE KIT) DEVI 1 kit by Does not apply route once a week. Check Blood Pressure regularly and record readings into the Babyscripts App.  Large Cuff.  DX O90.0 (Patient not taking: Reported on 07/25/2022)   hydrochlorothiazide (HYDRODIURIL) 25 MG tablet Take 1 tablet (25 mg total) by mouth daily.   [DISCONTINUED] ibuprofen (ADVIL) 600 MG tablet Take 1 tablet (600 mg total) by mouth every 6 (six) hours.   No facility-administered encounter medications on file as of 07/25/2022.    Past Medical History:  Diagnosis Date   Anemia    COVID-19 01/31/2019   COVID-19 virus infection 02/01/2019   7/27, symptomatic   Gestational diabetes    Headache    History of cold sores 11/17/2020   HSV (herpes simplex virus) anogenital infection    oral    Hypothyroidism    on meds currently   Pregnancy induced hypertension    Vaginal Pap smear, abnormal     Past Surgical History:  Procedure Laterality Date    CESAREAN SECTION     CESAREAN SECTION N/A 06/01/2019   Procedure: CESAREAN SECTION;  Surgeon: Olga Millers, MD;  Location: [facility];  Service: Obstetrics;  Laterality: N/A;    Family History  Problem Relation Age of Onset   Hypertension Father     Social History   Socioeconomic History   Marital status: Single    Spouse name: Personal assistant   Number of children: Not on file   Years of education: Not on file   Highest education level: Not on file  Occupational History   Not on file  Tobacco Use   Smoking status: Never   Smokeless tobacco: Never  Vaping Use   Vaping Use: Never used  Substance and Sexual Activity   Alcohol use: No   Drug use: No   Sexual activity: Yes    Partners: Male    Birth control/protection: None  Other Topics Concern   Not on file  Social History Narrative   Not on file   Social Determinants of Health   Financial Resource Strain: Not on file  Food Insecurity: No Food Insecurity (10/25/2020)   Hunger Vital Sign    Worried About Running Out of Food in the Last Year: Never true    Ran Out of Food in the Last Year: Never true  Transportation Needs: Not on file  Physical Activity: Not on file  Stress: Not on  file  Social Connections: Not on file  Intimate Partner Violence: Not on file    Review of Systems  Psychiatric/Behavioral:  Positive for depression. Negative for suicidal ideas. The patient is nervous/anxious.   All other systems reviewed and are negative.       Objective    BP 119/79   Pulse 62   Temp 98.1 F (36.7 C) (Oral)   Resp 16   Wt (!) 304 lb (137.9 kg)   SpO2 95%   BMI 54.72 kg/m   Physical Exam Vitals and nursing note reviewed.  Constitutional:      General: She is not in acute distress. Cardiovascular:     Rate and Rhythm: Normal rate and regular rhythm.  Pulmonary:     Effort: Pulmonary effort is normal.     Breath sounds: Normal breath sounds.  Abdominal:     Palpations: Abdomen is soft.      Tenderness: There is no abdominal tenderness.  Neurological:     General: No focal deficit present.     Mental Status: She is alert and oriented to person, place, and time.  Psychiatric:        Mood and Affect: Mood is depressed. Affect is blunt.        Behavior: Behavior normal. Behavior is cooperative.         Assessment & Plan:   1. Hypertension, unspecified type Appears stable. Continue. Meds refilled.  - hydrochlorothiazide (HYDRODIURIL) 25 MG tablet; Take 1 tablet (25 mg total) by mouth daily.  Dispense: 90 tablet; Refill: 1  2. Hypothyroidism, unspecified type Continue present management.   3. Anxiety and depression Zoloft prescribed. Referral to SW for further eval/mgt    Return in about 4 weeks (around 08/22/2022) for follow up.   Becky Sax, MD

## 2022-08-03 IMAGING — US US MFM FETAL BPP W/O NON-STRESS
1 series · 13 of 28 positions shown · non-contrast
Comparison: none

[Series 1: us mfm fetal bpp w/o non-stress · 35 acquisitions, 13 frames shown]
[im 2/35]
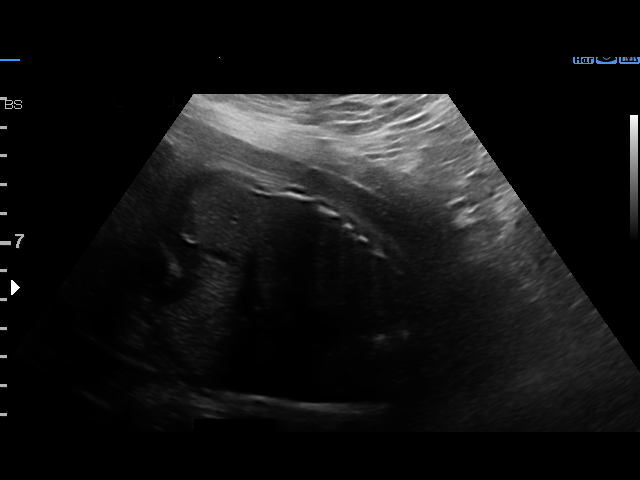
[im 4/35]
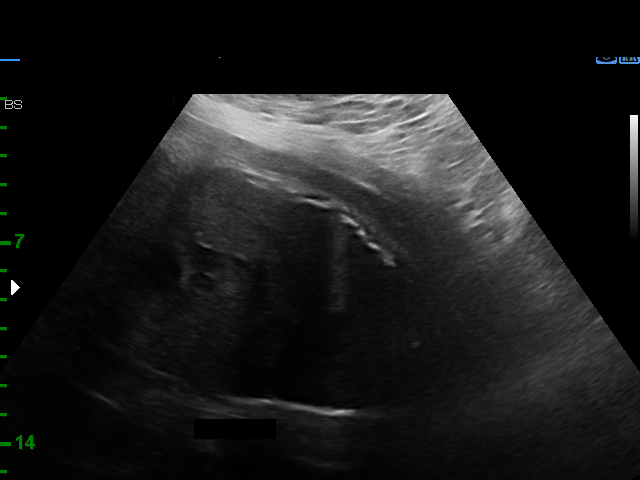
[im 7/35]
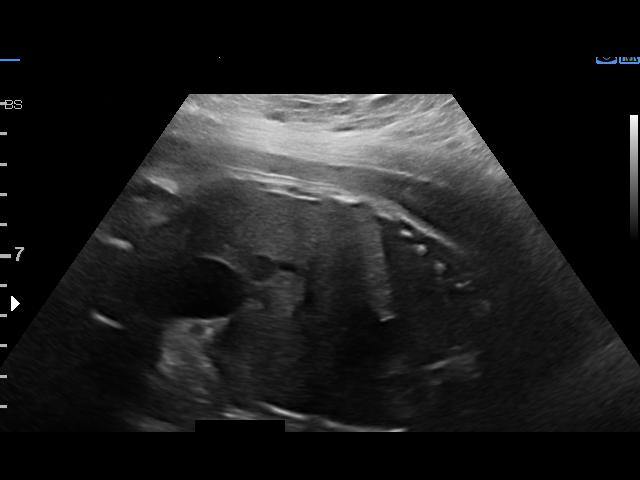
[im 9/35]
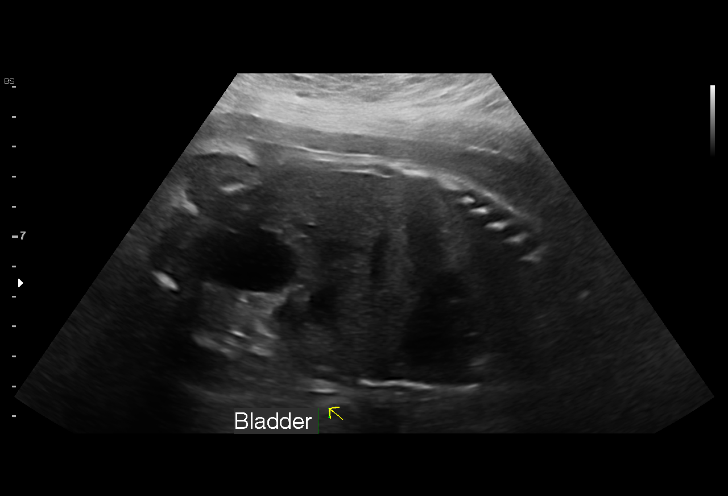
[im 12/35]
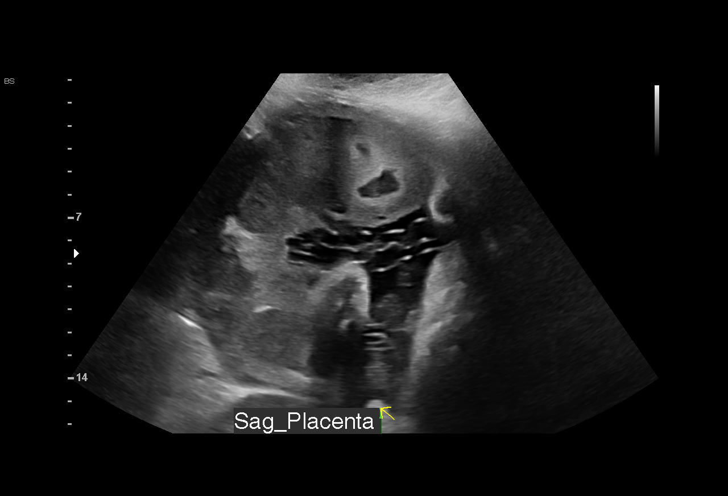
[im 14/35]
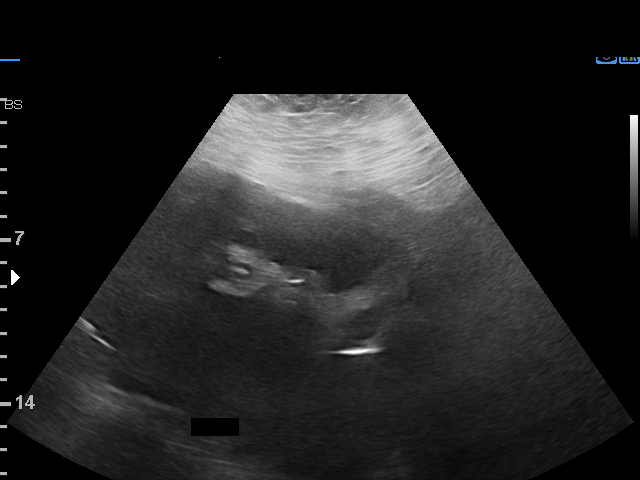
[im 18/35]
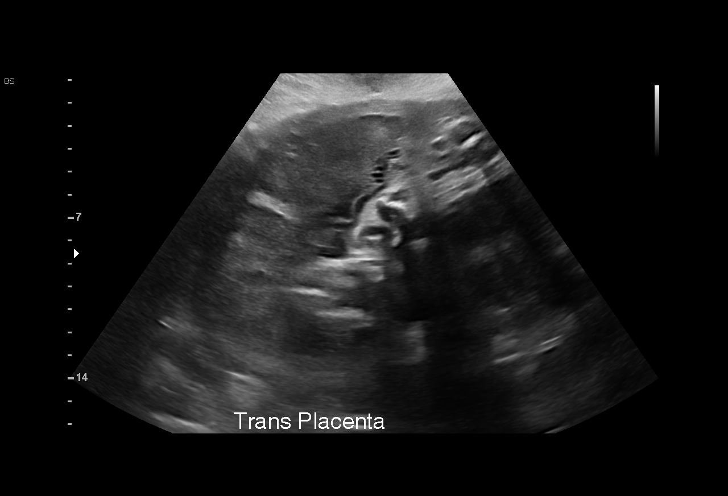
[im 21/35]
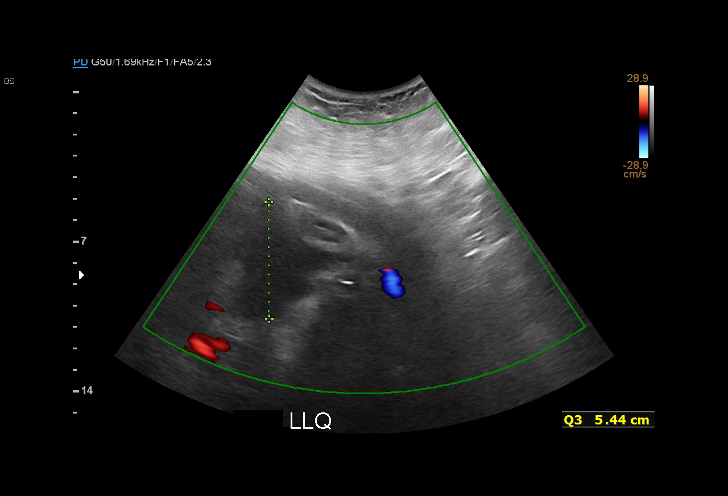
[im 23/35]
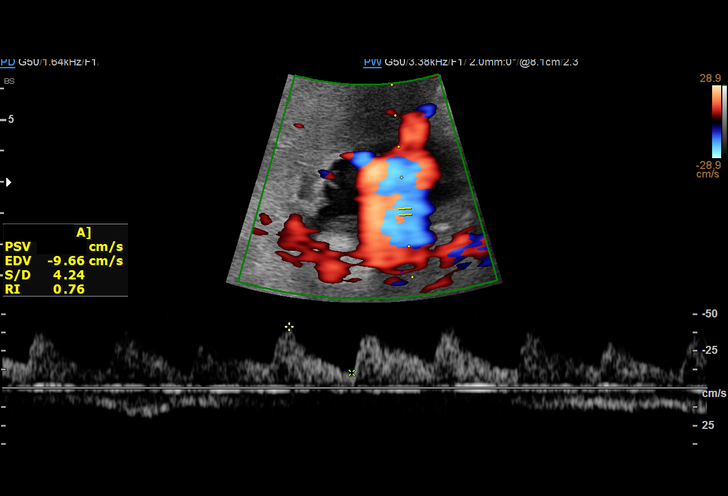
[im 26/35]
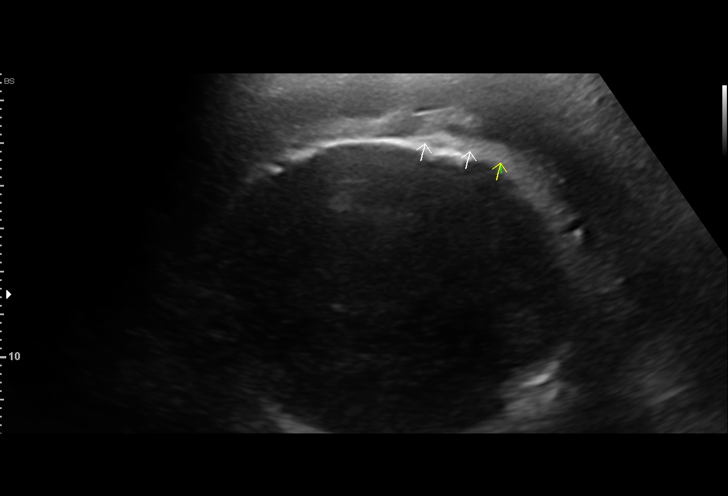
[im 28/35]
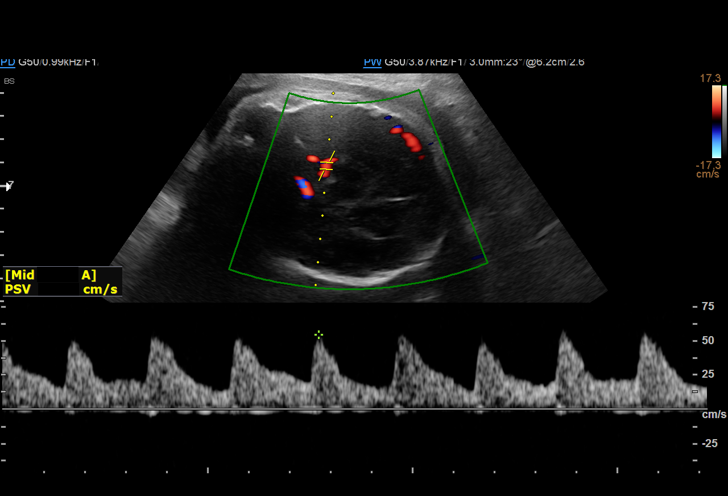
[im 31/35]
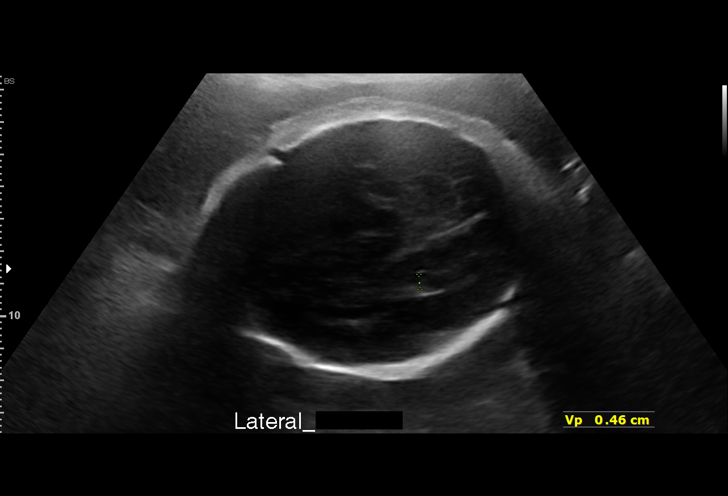
[im 33/35]
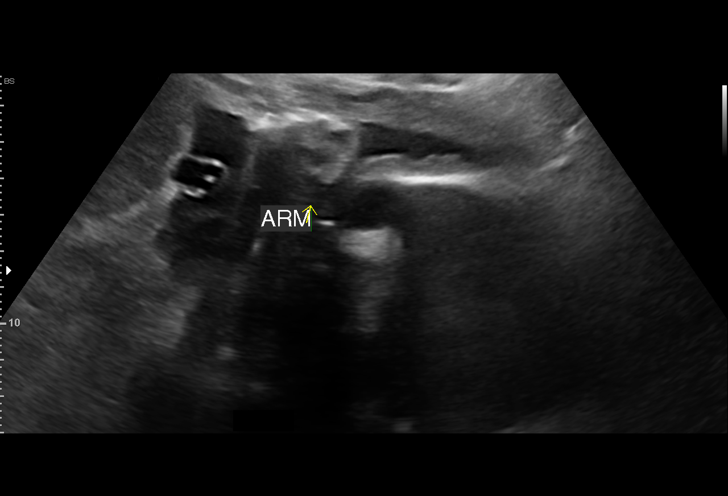

[13 of 28 positions shown; findings below may reference images not displayed]

2  US MFM MCA DOPPLER                    76821.01    KADIRTABHE SEIF KAYED
 3  US MFM UA CORD DOPPLER                76820.02    KADIRTABHE SEIF KAYED

Indications

 Advanced maternal age multigravida 35+,
 third trimester (39 yrs)
 Isoimmunization - Other
 Medical complication of pregnancy
 (Isoimmunization with prior pregnancies)
 Hypothyroid
 Previous cesarean delivery, antepartum (x2)
 Poor obstetric history: Previous preterm
 delivery, antepartum (36 wks)
 Obesity complicating pregnancy, third
 trimester (BMI 51)
 Maternal care for known or suspected poor
 fetal growth, third trimester, not applicable or
 unspecified IUGR
 32 weeks gestation of pregnancy
Fetal Evaluation

 Num Of Fetuses:         1
 Fetal Heart Rate(bpm):  150
 Cardiac Activity:       Observed
 Presentation:           Cephalic
 Placenta:               Anterior Fundal Rt
 P. Cord Insertion:      Previously Visualized
 Amniotic Fluid
 AFI FV:      Within normal limits

 AFI Sum(cm)     %Tile       Largest Pocket(cm)
 10.5            21

 RUQ(cm)       RLQ(cm)       LUQ(cm)        LLQ(cm)

Biophysical Evaluation

 Amniotic F.V:   Pocket => 2 cm             F. Tone:        Observed
 F. Movement:    Observed                   Score:          [DATE]
 F. Breathing:   Observed
Biometry

 LV:        4.6  mm
OB History

 Blood Type:   AB+
 Gravidity:    8         Term:   4        Prem:   1        SAB:   1
 TOP:          1       Ectopic:  0        Living: 5
Gestational Age

 LMP:           32w 4d        Date:  03/31/20                 EDD:   01/05/21
 Clinical EDD:  31w 4d                                        EDD:   01/12/21
Anatomy

 Cranium:               Previously seen        LVOT:                   Previously seen
 Cavum:                 Previously seen        Aortic Arch:            Previously seen
 Ventricles:            Appears normal         Ductal Arch:            Previously seen
 Choroid Plexus:        Previously seen        Diaphragm:              Appears normal
 Cerebellum:            Previously seen        Stomach:                Appears normal, left
                                                                       sided
 Posterior Fossa:       Previously seen        Abdomen:                Previously seen
 Nuchal Fold:           Not applicable (>20    Abdominal Wall:         Previously seen
                        wks GA)
 Face:                  Orbits and profile     Cord Vessels:           Previously seen
                        previously seen
 Lips:                  Previously seen        Kidneys:                Appear normal
 Palate:                Previously seen        Bladder:                Appears normal
 Thoracic:              Previously seen        Spine:                  Previously seen
 Heart:                 Previously seen        Upper Extremities:      Previously seen
 RVOT:                  Previously seen        Lower Extremities:      Previously seen

 Other:  Female gender previously seen.  Heels, Heels/feet, open hands/ Left
         5th digit visualized previously. Technically difficult due to maternal
         habitus and fetal position.
Doppler - Fetal Vessels

 Umbilical Artery
  S/D     %tile      RI    %tile      PI    %tile            ADFV    RDFV
  3.38       86     0.7       86    1.04       78               No      No
 Middle Cerebral Artery
                                      PI    %tile     PSV   MoM
                                                    (cm/s)
                                    1.04    <

 Cerebroplacental Ratio
                        MCA PI / UA PI     %tile
                                     1     <

Cervix Uterus Adnexa

 Cervix
 Not visualized (advanced GA >00wks)
Impression

 Patient is admitted with diagnosis of chronic hypertension
 superimposed with preeclampsia with severe features. Fetal
 growth restriction was confirmed on previous growth
 assessment. She has increased anti-E and anti-c antibody
 titer levels.
 Delivery is planned for 34 weeks.
 Amniotic fluid is normal and good fetal activity is seen.
 Cephalic presentation. Antenatal testing is reassuring.
 Umbilical artery Doppler showed normal forward diastolic
 flow. Middle-cerebral artery Doppler showed normal peak
 systolic velocity measurements.
Recommendations

 -Twice-weekly BPP, UA and MCA Dopplers till delivery (Pedro Igor
 [place]).
 -Delivery at 34 weeks.
                 Kolli, Howie

## 2022-08-27 ENCOUNTER — Ambulatory Visit: Payer: Medicaid Other | Admitting: Family Medicine

## 2022-08-28 ENCOUNTER — Encounter: Payer: Self-pay | Admitting: Family Medicine

## 2022-08-28 ENCOUNTER — Ambulatory Visit (INDEPENDENT_AMBULATORY_CARE_PROVIDER_SITE_OTHER): Payer: Medicaid Other | Admitting: Family Medicine

## 2022-08-28 VITALS — BP 131/82 | HR 66 | Temp 98.1°F | Resp 16 | Wt 304.0 lb

## 2022-08-28 DIAGNOSIS — M62838 Other muscle spasm: Secondary | ICD-10-CM

## 2022-08-28 DIAGNOSIS — E6609 Other obesity due to excess calories: Secondary | ICD-10-CM

## 2022-08-28 DIAGNOSIS — E039 Hypothyroidism, unspecified: Secondary | ICD-10-CM

## 2022-08-28 DIAGNOSIS — F419 Anxiety disorder, unspecified: Secondary | ICD-10-CM | POA: Diagnosis not present

## 2022-08-28 DIAGNOSIS — Z6841 Body Mass Index (BMI) 40.0 and over, adult: Secondary | ICD-10-CM

## 2022-08-28 DIAGNOSIS — F32A Depression, unspecified: Secondary | ICD-10-CM | POA: Diagnosis not present

## 2022-08-28 DIAGNOSIS — I10 Essential (primary) hypertension: Secondary | ICD-10-CM

## 2022-08-28 DIAGNOSIS — Z98891 History of uterine scar from previous surgery: Secondary | ICD-10-CM | POA: Insufficient documentation

## 2022-08-28 DIAGNOSIS — R030 Elevated blood-pressure reading, without diagnosis of hypertension: Secondary | ICD-10-CM | POA: Insufficient documentation

## 2022-08-28 DIAGNOSIS — U071 COVID-19: Secondary | ICD-10-CM | POA: Insufficient documentation

## 2022-08-28 DIAGNOSIS — O09529 Supervision of elderly multigravida, unspecified trimester: Secondary | ICD-10-CM | POA: Insufficient documentation

## 2022-08-28 MED ORDER — POTASSIUM CHLORIDE CRYS ER 10 MEQ PO TBCR: 10.0000 meq | EXTENDED_RELEASE_TABLET | Freq: Two times a day (BID) | ORAL | 1 refills | Status: DC

## 2022-08-29 ENCOUNTER — Encounter: Payer: Self-pay | Admitting: Family Medicine

## 2022-08-29 LAB — LIPID PANEL
Chol/HDL Ratio: 4.5 ratio — ABNORMAL HIGH (ref 0.0–4.4)
Cholesterol, Total: 180 mg/dL (ref 100–199)
HDL: 40 mg/dL (ref >39)
LDL Chol Calc (NIH): 114 mg/dL — ABNORMAL HIGH (ref 0–99)
Triglycerides: 145 mg/dL (ref 0–149)
VLDL Cholesterol Cal: 26 mg/dL (ref 5–40)

## 2022-08-29 LAB — TSH: TSH: 1.67 u[IU]/mL (ref 0.450–4.500)

## 2022-08-29 LAB — BASIC METABOLIC PANEL
BUN/Creatinine Ratio: 26 — ABNORMAL HIGH (ref 9–23)
BUN: 19 mg/dL (ref 6–24)
CO2: 19 mmol/L — ABNORMAL LOW (ref 20–29)
Calcium: 9.4 mg/dL (ref 8.7–10.2)
Chloride: 100 mmol/L (ref 96–106)
Creatinine, Ser: 0.73 mg/dL (ref 0.57–1.00)
Glucose: 91 mg/dL (ref 70–99)
Potassium: 3.9 mmol/L (ref 3.5–5.2)
Sodium: 136 mmol/L (ref 134–144)
eGFR: 105 mL/min/{1.73_m2} (ref >59)

## 2022-08-29 LAB — T4, FREE: Free T4: 1.24 ng/dL (ref 0.82–1.77)

## 2022-08-29 NOTE — Progress Notes (Signed)
Established Patient Office Visit  Subjective    Patient ID: Marisa Campbell, female    DOB: [birth year]  Age: 42 y.o. MRN: [id number]  CC:  Chief Complaint  Patient presents with   Follow-up   Hypertension    HPI Marisa Campbell presents for routine follow up of chronic med issues. Patient reports some muscle spasms. Marisa Campbell also reports that Marisa Campbell does not really drink adequate water.    Outpatient Encounter Medications as of 08/28/2022  Medication Sig   potassium chloride (KLOR-CON M) 10 MEQ tablet Take 1 tablet (10 mEq total) by mouth 2 (two) times daily.   aspirin EC 81 MG tablet Take 81 mg by mouth daily. Swallow whole.   Blood Pressure Monitoring (BLOOD PRESSURE KIT) DEVI 1 kit by Does not apply route once a week. Check Blood Pressure regularly and record readings into the Babyscripts App.  Large Cuff.  DX O90.0 (Patient not taking: Reported on 07/25/2022)   estradiol (ESTRACE VAGINAL) 0.1 MG/GM vaginal cream Place 1 Applicatorful vaginally at bedtime.   hydrochlorothiazide (HYDRODIURIL) 25 MG tablet Take 1 tablet (25 mg total) by mouth daily.   labetalol (NORMODYNE) 200 MG tablet Take 1 tablet (200 mg total) by mouth 2 (two) times daily.   levothyroxine (SYNTHROID) 125 MCG tablet Take 1 tablet (125 mcg total) by mouth daily before breakfast.   Prenatal Vit-Fe Fumarate-FA (PRENATAL VITAMIN PO) Take by mouth.   sertraline (ZOLOFT) 50 MG tablet Take 1 tablet (50 mg total) by mouth daily.   valACYclovir (VALTREX) 1000 MG tablet TAKE 2 TABLETS BY MOUTH EVERY 12 HOURS FOR 1 DAY   No facility-administered encounter medications on file as of 08/28/2022.    Past Medical History:  Diagnosis Date   Anemia    COVID-19 01/31/2019   COVID-19 virus infection 02/01/2019   7/27, symptomatic   Gestational diabetes    Headache    History of cold sores 11/17/2020   HSV (herpes simplex virus) anogenital infection    oral    Hypothyroidism    on meds currently   Pregnancy induced  hypertension    Vaginal Pap smear, abnormal     Past Surgical History:  Procedure Laterality Date   CESAREAN SECTION     CESAREAN SECTION N/A 06/01/2019   Procedure: CESAREAN SECTION;  Surgeon: Olga Millers, MD;  Location: [facility];  Service: Obstetrics;  Laterality: N/A;    Family History  Problem Relation Age of Onset   Hypertension Father     Social History   Socioeconomic History   Marital status: Single    Spouse name: Personal assistant   Number of children: Not on file   Years of education: Not on file   Highest education level: Not on file  Occupational History   Not on file  Tobacco Use   Smoking status: Never   Smokeless tobacco: Never  Vaping Use   Vaping Use: Never used  Substance and Sexual Activity   Alcohol use: No   Drug use: No   Sexual activity: Yes    Partners: Male    Birth control/protection: None  Other Topics Concern   Not on file  Social History Narrative   Not on file   Social Determinants of Health   Financial Resource Strain: Not on file  Food Insecurity: No Food Insecurity (10/25/2020)   Hunger Vital Sign    Worried About Running Out of Food in the Last Year: Never true    Ran Out of Food in  the Last Year: Never true  Transportation Needs: Not on file  Physical Activity: Not on file  Stress: Not on file  Social Connections: Not on file  Intimate Partner Violence: Not on file    Review of Systems  All other systems reviewed and are negative.       Objective    BP 131/82   Pulse 66   Temp 98.1 F (36.7 C) (Oral)   Resp 16   Wt (!) 304 lb (137.9 kg)   SpO2 96%   BMI 54.72 kg/m   Physical Exam Vitals and nursing note reviewed.  Constitutional:      General: Marisa Campbell is not in acute distress.    Appearance: Marisa Campbell is obese.  Neck:     Thyroid: No thyromegaly.  Cardiovascular:     Rate and Rhythm: Normal rate and regular rhythm.  Pulmonary:     Effort: Pulmonary effort is normal.     Breath sounds: Normal breath  sounds.  Abdominal:     Palpations: Abdomen is soft.     Tenderness: There is no abdominal tenderness.  Musculoskeletal:     Cervical back: Normal range of motion and neck supple.  Neurological:     General: No focal deficit present.     Mental Status: Marisa Campbell is alert and oriented to person, place, and time.  Psychiatric:        Mood and Affect: Mood normal.        Behavior: Behavior normal. Behavior is cooperative.         Assessment & Plan:   1. Hypothyroidism, unspecified type Appears stable. Monitoring labs ordered.  - T4, Free - TSH  2. Anxiety and depression Patient deferred starting agent. Will monitor off agent  3. Hypertension, unspecified type Appears stable. Continue. Monitoring labs ordered - Basic Metabolic Panel - Lipid Panel  4. Muscle spasm ? 2/2 HCTZ and low fluid intake. Will check BMP and prescribed potassium supplement.  - Basic Metabolic Panel  Return in about 3 months (around 11/26/2022) for follow up.   Becky Sax, MD

## 2022-10-07 ENCOUNTER — Other Ambulatory Visit: Payer: Self-pay | Admitting: Family Medicine

## 2022-10-21 ENCOUNTER — Telehealth: Payer: Self-pay | Admitting: Family Medicine

## 2022-10-21 NOTE — Telephone Encounter (Signed)
Copied from CRM [id number]. Topic: General - Other >> Oct 17, 2022  4:15 PM Franchot Heidelberg wrote: Reason for CRM: Pt called requesting for PCP to order labs for her, she wants to have her thyroid and her iron checked, along with 11 other orders. Has orders from another provider but wants to have her labs drawn at [facility].  Requesting a call back from the clinic, she says there are 13 different labs however she says it is just supposed to be iron and thyroid. These orders are from [facility].  Says she is willing to have an appt with PCP for this if needed

## 2022-10-21 NOTE — Telephone Encounter (Signed)
APPT needed

## 2022-10-22 ENCOUNTER — Ambulatory Visit (INDEPENDENT_AMBULATORY_CARE_PROVIDER_SITE_OTHER): Payer: Medicaid Other | Admitting: Family Medicine

## 2022-10-22 DIAGNOSIS — E039 Hypothyroidism, unspecified: Secondary | ICD-10-CM

## 2022-10-22 DIAGNOSIS — H1011 Acute atopic conjunctivitis, right eye: Secondary | ICD-10-CM

## 2022-10-23 LAB — T4, FREE: Free T4: 0.99 ng/dL (ref 0.82–1.77)

## 2022-10-23 LAB — T3, FREE: T3, Free: 3.9 pg/mL (ref 2.0–4.4)

## 2022-10-23 LAB — TSH: TSH: 1.07 u[IU]/mL (ref 0.450–4.500)

## 2022-10-28 ENCOUNTER — Encounter: Payer: Self-pay | Admitting: Family Medicine

## 2022-10-28 NOTE — Progress Notes (Signed)
Established Patient Office Visit  Subjective    Patient ID: Marisa Campbell, female    DOB: 1981-05-21  Age: 42 y.o. MRN: [id number]  CC: No chief complaint on file.   HPI Marisa Campbell presents for follow up of hypothyroidism. She also reports redness of right eye with itching. She denies fever/chills or viral sx. She denies known contacts or exposures. She denies known trauma or injury.   Outpatient Encounter Medications as of 10/22/2022  Medication Sig   aspirin EC 81 MG tablet Take 81 mg by mouth daily. Swallow whole.   Blood Pressure Monitoring (BLOOD PRESSURE KIT) DEVI 1 kit by Does not apply route once a week. Check Blood Pressure regularly and record readings into the Babys[employer] App.  Large Cuff.  DX O90.0 (Patient not taking: Reported on 07/25/2022)   estradiol (ESTRACE VAGINAL) 0.1 MG/GM vaginal cream Place 1 Applicatorful vaginally at bedtime.   hydrochlorothiazide (HYDRODIURIL) 25 MG tablet Take 1 tablet (25 mg total) by mouth daily.   labetalol (NORMODYNE) 200 MG tablet TAKE 1 TABLET BY MOUTH TWICE DAILY . APPOINTMENT REQUIRED FOR FUTURE REFILLS   levothyroxine (SYNTHROID) 125 MCG tablet Take 1 tablet (125 mcg total) by mouth daily before breakfast.   potassium chloride (KLOR-CON M) 10 MEQ tablet Take 1 tablet (10 mEq total) by mouth 2 (two) times daily.   Prenatal Vit-Fe Fumarate-FA (PRENATAL VITAMIN PO) Take by mouth.   sertraline (ZOLOFT) 50 MG tablet Take 1 tablet (50 mg total) by mouth daily.   valACYclovir (VALTREX) 1000 MG tablet TAKE 2 TABLETS BY MOUTH EVERY 12 HOURS FOR 1 DAY   No facility-administered encounter medications on file as of 10/22/2022.    Past Medical History:  Diagnosis Date   Anemia    COVID-19 01/31/2019   COVID-19 virus infection 02/01/2019   7/27, symptomatic   Gestational diabetes    Headache    History of cold sores 11/17/2020   HSV (herpes simplex virus) anogenital infection    oral    Hypothyroidism    on meds currently    Pregnancy induced hypertension    Vaginal Pap smear, abnormal     Past Surgical History:  Procedure Laterality Date   CESAREAN SECTION     CESAREAN SECTION N/A 06/01/2019   Procedure: CESAREAN SECTION;  Surgeon: Levi Aland, MD;  Location: [facility];  Service: Obstetrics;  Laterality: N/A;    Family History  Problem Relation Age of Onset   Hypertension Father     Social History   Socioeconomic History   Marital status: Single    Spouse name: Lexicographer   Number of children: Not on file   Years of education: Not on file   Highest education level: Not on file  Occupational History   Not on file  Tobacco Use   Smoking status: Never   Smokeless tobacco: Never  Vaping Use   Vaping Use: Never used  Substance and Sexual Activity   Alcohol use: No   Drug use: No   Sexual activity: Yes    Partners: Male    Birth control/protection: None  Other Topics Concern   Not on file  Social History Narrative   Not on file   Social Determinants of Health   Financial Resource Strain: Not on file  Food Insecurity: No Food Insecurity (10/25/2020)   Hunger Vital Sign    Worried About Running Out of Food in the Last Year: Never true    Ran Out of Food in the  Last Year: Never true  Transportation Needs: Not on file  Physical Activity: Not on file  Stress: Not on file  Social Connections: Not on file  Intimate Partner Violence: Not on file    Review of Systems  All other systems reviewed and are negative.       Objective    There were no vitals taken for this visit.  Physical Exam Vitals and nursing note reviewed.  Constitutional:      General: She is not in acute distress.    Appearance: She is obese.  Neck:     Thyroid: No thyromegaly.  Cardiovascular:     Rate and Rhythm: Normal rate and regular rhythm.  Pulmonary:     Effort: Pulmonary effort is normal.     Breath sounds: Normal breath sounds.  Abdominal:     Palpations: Abdomen is soft.      Tenderness: There is no abdominal tenderness.  Musculoskeletal:     Cervical back: Normal range of motion and neck supple.  Neurological:     General: No focal deficit present.     Mental Status: She is alert and oriented to person, place, and time.  Psychiatric:        Mood and Affect: Mood normal.        Behavior: Behavior normal. Behavior is cooperative.         Assessment & Plan:   1. Acute atopic conjunctivitis of right eye Patient opts for OTC pataday eye drops.   2. Hypothyroidism, unspecified type Monitoring labs ordered - TSH - T3, free - T4, free  No follow-ups on file.   Tommie Raymond, MD

## 2022-11-26 ENCOUNTER — Ambulatory Visit: Payer: Medicaid Other | Admitting: Family Medicine

## 2022-12-19 ENCOUNTER — Ambulatory Visit (INDEPENDENT_AMBULATORY_CARE_PROVIDER_SITE_OTHER): Payer: Medicaid Other | Admitting: Emergency Medicine

## 2022-12-19 VITALS — BP 111/75 | HR 66 | Wt 285.0 lb

## 2022-12-19 DIAGNOSIS — Z3200 Encounter for pregnancy test, result unknown: Secondary | ICD-10-CM

## 2022-12-19 DIAGNOSIS — Z3201 Encounter for pregnancy test, result positive: Secondary | ICD-10-CM

## 2022-12-19 DIAGNOSIS — R300 Dysuria: Secondary | ICD-10-CM | POA: Diagnosis not present

## 2022-12-19 LAB — POCT URINALYSIS DIPSTICK
Bilirubin, UA: NEGATIVE
Glucose, UA: NEGATIVE
Nitrite, UA: NEGATIVE
Protein, UA: POSITIVE — AB
Spec Grav, UA: >=1.03 — AB (ref 1.010–1.025)
Urobilinogen, UA: 0.2 E.U./dL
pH, UA: 6.5 (ref 5.0–8.0)

## 2022-12-19 LAB — POCT URINE PREGNANCY: Preg Test, Ur: POSITIVE — AB

## 2022-12-19 NOTE — Progress Notes (Signed)
Ms. Righetti presents today for UPT. She has no unusual complaints and complains of abdominal pain in the right lower quadrant and left lower quadrant and backache. Concerned for UTI. Also endorses nausea.   LMP: 11/15/2022    OBJECTIVE: Appears well, in no apparent distress.  OB History     Gravida  7   Para  6   Term  4   Preterm  2   AB  1   Living  6      SAB  0   IAB  1   Ectopic      Multiple  0   Live Births  6          Home UPT Result: Positive In-Office UPT result: Positive I have reviewed the patient's medical, obstetrical, social, and family histories, and medications.   ASSESSMENT: Positive pregnancy test  PLAN Prenatal care to be completed at: [facility] Urine Culture sent to Lab.

## 2022-12-21 LAB — URINE CULTURE, OB REFLEX: Organism ID, Bacteria: NO GROWTH

## 2022-12-21 LAB — CULTURE, OB URINE

## 2023-01-08 ENCOUNTER — Other Ambulatory Visit: Payer: Self-pay | Admitting: Family Medicine

## 2023-01-08 NOTE — Telephone Encounter (Signed)
Requested Prescriptions  Pending Prescriptions Disp Refills   labetalol (NORMODYNE) 200 MG tablet [Pharmacy Med Name: Labetalol HCl 200 MG Oral Tablet] 180 tablet 0    Sig: Take 1 tablet by mouth twice daily     Cardiovascular:  Beta Blockers Passed - 01/08/2023  9:04 AM      Passed - Last BP in normal range    BP Readings from Last 1 Encounters:  12/19/22 111/75         Passed - Last Heart Rate in normal range    Pulse Readings from Last 1 Encounters:  12/19/22 66         Passed - Valid encounter within last 6 months    Recent Outpatient Visits           2 months ago Acute atopic conjunctivitis of right eye   [facility], MD   4 months ago Hypothyroidism, unspecified type   [facility], MD   5 months ago Hypertension, unspecified type   [facility], MD   12 months ago Acute pharyngitis, unspecified etiology   [facility], MD   1 year ago Vaginal dryness    [facility], MD

## 2023-01-09 ENCOUNTER — Inpatient Hospital Stay (HOSPITAL_COMMUNITY): Payer: Medicaid Other

## 2023-01-09 ENCOUNTER — Inpatient Hospital Stay (HOSPITAL_COMMUNITY)
Admission: AD | Admit: 2023-01-09 | Discharge: 2023-01-09 | Disposition: A | Discharge status: Home / Self Care | Payer: Medicaid Other | Attending: Obstetrics and Gynecology | Admitting: Obstetrics and Gynecology

## 2023-01-09 ENCOUNTER — Other Ambulatory Visit: Payer: Self-pay

## 2023-01-09 DIAGNOSIS — Z3A01 Less than 8 weeks gestation of pregnancy: Secondary | ICD-10-CM | POA: Diagnosis not present

## 2023-01-09 DIAGNOSIS — O209 Hemorrhage in early pregnancy, unspecified: Secondary | ICD-10-CM | POA: Diagnosis present

## 2023-01-09 DIAGNOSIS — O0941 Supervision of pregnancy with grand multiparity, first trimester: Secondary | ICD-10-CM | POA: Insufficient documentation

## 2023-01-09 DIAGNOSIS — O09521 Supervision of elderly multigravida, first trimester: Secondary | ICD-10-CM | POA: Insufficient documentation

## 2023-01-09 DIAGNOSIS — O039 Complete or unspecified spontaneous abortion without complication: Secondary | ICD-10-CM | POA: Diagnosis not present

## 2023-01-09 LAB — URINALYSIS, ROUTINE W REFLEX MICROSCOPIC
Bilirubin Urine: NEGATIVE
Glucose, UA: NEGATIVE mg/dL
Hgb urine dipstick: NEGATIVE
Ketones, ur: NEGATIVE mg/dL
Leukocytes,Ua: NEGATIVE
Nitrite: NEGATIVE
Protein, ur: NEGATIVE mg/dL
Specific Gravity, Urine: 1.01 (ref 1.005–1.030)
pH: 6 (ref 5.0–8.0)

## 2023-01-09 LAB — CBC
HCT: 34.8 % — ABNORMAL LOW (ref 36.0–46.0)
Hemoglobin: 11.4 g/dL — ABNORMAL LOW (ref 12.0–15.0)
MCH: 25.6 pg — ABNORMAL LOW (ref 26.0–34.0)
MCHC: 32.8 g/dL (ref 30.0–36.0)
MCV: 78.2 fL — ABNORMAL LOW (ref 80.0–100.0)
Platelets: 192 10*3/uL (ref 150–400)
RBC: 4.45 MIL/uL (ref 3.87–5.11)
RDW: 16.3 % — ABNORMAL HIGH (ref 11.5–15.5)
WBC: 8.8 10*3/uL (ref 4.0–10.5)
nRBC: 0 % (ref 0.0–0.2)

## 2023-01-09 LAB — WET PREP, GENITAL
Clue Cells Wet Prep HPF POC: NONE SEEN
Sperm: NONE SEEN
Trich, Wet Prep: NONE SEEN
WBC, Wet Prep HPF POC: <10 (ref <10)
Yeast Wet Prep HPF POC: NONE SEEN

## 2023-01-09 LAB — HCG, QUANTITATIVE, PREGNANCY: hCG, Beta Chain, Quant, S: 49880 m[IU]/mL — ABNORMAL HIGH (ref <5)

## 2023-01-09 NOTE — MAU Note (Signed)
Marisa Campbell is a 42 y.o. at [redacted]w[redacted]d here in MAU reporting: she had an episode of VB while bearing down during a bowel movement today.  Reports initially wiped noted VB and has since used restroom and now has pink discharge.  Reports has "light cramping" but this is ongoing and has been since onset of pregnancy.  Last intercourse was last night. LMP: 11/15/22 Onset of complaint: today Pain score: 3 Vitals:   01/09/23 1650  BP: 124/64  Pulse: 68  Resp: 17  Temp: 98.2 F (36.8 C)  SpO2: 97%     FHT:NA Lab orders placed from triage:

## 2023-01-09 NOTE — MAU Provider Note (Signed)
History     CSN: [id number]  Arrival date and time: 01/09/23 1620   None     Chief Complaint  Patient presents with   Vaginal Bleeding   Patient presenting for vaginal bleeding today.  She reports she is approximately 7 weeks 5 days gestation.  She reports that she went to the restroom and wiped and when she did she noticed blood on the tissue paper.  Reports that her nausea and vomiting which she equated to pregnancy decreased approximately a week ago.  Denies any abdominal pain or any other symptoms.  Reports she has been working out and her blood pressure has been extremely well-controlled.    OB History     Gravida  8   Para  6   Term  4   Preterm  2   AB  1   Living  6      SAB  0   IAB  1   Ectopic      Multiple  0   Live Births  6           Past Medical History:  Diagnosis Date   Anemia    COVID-19 01/31/2019   COVID-19 virus infection 02/01/2019   7/27, symptomatic   Gestational diabetes    Headache    History of cold sores 11/17/2020   HSV (herpes simplex virus) anogenital infection    oral    Hypothyroidism    on meds currently   Pregnancy induced hypertension    Vaginal Pap smear, abnormal     Past Surgical History:  Procedure Laterality Date   CESAREAN SECTION     CESAREAN SECTION N/A 06/01/2019   Procedure: CESAREAN SECTION;  Surgeon: Levi Aland, MD;  Location: [facility];  Service: Obstetrics;  Laterality: N/A;    Family History  Problem Relation Age of Onset   Hypertension Father     Social History   Tobacco Use   Smoking status: Never   Smokeless tobacco: Never  Vaping Use   Vaping Use: Never used  Substance Use Topics   Alcohol use: No   Drug use: No    Allergies: No Known Allergies  Medications Prior to Admission  Medication Sig Dispense Refill Last Dose   aspirin EC 81 MG tablet Take 81 mg by mouth daily. Swallow whole.      Blood Pressure Monitoring (BLOOD PRESSURE KIT) DEVI 1 kit by Does not apply  route once a week. Check Blood Pressure regularly and record readings into the [employer] App.  Large Cuff.  DX O90.0 (Patient not taking: Reported on 07/25/2022) 1 each 0    estradiol (ESTRACE VAGINAL) 0.1 MG/GM vaginal cream Place 1 Applicatorful vaginally at bedtime. (Patient not taking: Reported on 12/19/2022) 42.5 g 0    hydrochlorothiazide (HYDRODIURIL) 25 MG tablet Take 1 tablet (25 mg total) by mouth daily. (Patient not taking: Reported on 12/19/2022) 90 tablet 1    labetalol (NORMODYNE) 200 MG tablet Take 1 tablet by mouth twice daily 180 tablet 0    levothyroxine (SYNTHROID) 125 MCG tablet Take 1 tablet (125 mcg total) by mouth daily before breakfast. (Patient not taking: Reported on 12/19/2022) 90 tablet 0    NP THYROID 90 MG tablet Take 90 mg by mouth every morning.      potassium chloride (KLOR-CON M) 10 MEQ tablet Take 1 tablet (10 mEq total) by mouth 2 (two) times daily. 90 tablet 1    Prenatal Vit-Fe Fumarate-FA (PRENATAL VITAMIN PO) Take by  mouth.      sertraline (ZOLOFT) 50 MG tablet Take 1 tablet (50 mg total) by mouth daily. (Patient not taking: Reported on 12/19/2022) 30 tablet 0    valACYclovir (VALTREX) 1000 MG tablet TAKE 2 TABLETS BY MOUTH EVERY 12 HOURS FOR 1 DAY (Patient not taking: Reported on 12/19/2022)       Review of Systems  Respiratory:  Negative for shortness of breath.   Cardiovascular:  Negative for chest pain.  Gastrointestinal:  Negative for abdominal pain, constipation, diarrhea, nausea and vomiting.  Genitourinary:  Positive for vaginal bleeding. Negative for vaginal discharge.  Musculoskeletal:  Negative for back pain.  All other systems reviewed and are negative.  Physical Exam   Blood pressure 124/64, pulse 68, temperature 98.2 F (36.8 C), temperature source Oral, resp. rate 17, height 5\' 3"  (1.6 m), weight 130.3 kg, last menstrual period 11/15/2022, SpO2 97 %, currently breastfeeding.  Physical Exam Vitals reviewed.  Constitutional:       Appearance: Normal appearance.  HENT:     Head: Normocephalic.     Right Ear: There is no impacted cerumen.     Nose: Nose normal.     Mouth/Throat:     Mouth: Mucous membranes are moist.  Eyes:     Pupils: Pupils are equal, round, and reactive to light.  Cardiovascular:     Rate and Rhythm: Normal rate.     Pulses: Normal pulses.  Pulmonary:     Effort: Pulmonary effort is normal.  Abdominal:     General: Abdomen is flat.  Musculoskeletal:        General: Normal range of motion.     Cervical back: Normal range of motion.  Skin:    General: Skin is warm.     Capillary Refill: Capillary refill takes less than 2 seconds.  Neurological:     General: No focal deficit present.     Mental Status: She is alert.  Psychiatric:        Mood and Affect: Mood normal.     MAU Course  Procedures  MDM CBC Beta-hCG quant Wet prep Ultrasound   Assessment and Plan  Marisa Campbell is a [id number] presenting for vaginal bleeding.  Vaginal bleeding in early pregnancy Patient vaginal bleeding started today where she noticed blood on the toilet paper.  Reports no other symptoms.  Beta-hCG at an appropriate level.  Wet prep within normal limits.  Ultrasound showing single IUP with crown-rump measuring approximately 6 weeks 5 days.  No cardiac activity was visualized definitive criteria for failed pregnancy.  Blood type is AB+ so no need for RhoGAM.  Discussed expectant management, medical management, surgical management and patient would like expectant management at this time.  She would like to keep her appointment next week for routine OB visit with ultrasound to verify failed pregnancy.  Discussed strict return precautions and if she would like medical management she can either return or go to her OB office.  Patient is agreeable.  No further questions or concerns.  Patient discharged home.  Celedonio Savage 01/09/2023, 6:43 PM

## 2023-01-09 NOTE — Discharge Instructions (Signed)
It was a pleasure taking care of you today.  Sorry for your loss.  Your ultrasound showed no cardiac activity.  We discussed management with medications versus expectant management.  If you decide you do want medical management please return or see your OB provider.  I do recommend going to your appointment next week and I will send a message to the clinic.  If you start having worsening abdominal pain, considerable bleeding, fever please return for further evaluation.

## 2023-01-10 LAB — GC/CHLAMYDIA PROBE AMP (~~LOC~~) NOT AT ARMC
Chlamydia: NEGATIVE
Comment: NEGATIVE
Comment: NORMAL
Neisseria Gonorrhea: NEGATIVE

## 2023-01-13 ENCOUNTER — Telehealth: Payer: Self-pay | Admitting: *Deleted

## 2023-01-13 ENCOUNTER — Other Ambulatory Visit: Payer: Self-pay | Admitting: *Deleted

## 2023-01-13 DIAGNOSIS — O039 Complete or unspecified spontaneous abortion without complication: Secondary | ICD-10-CM

## 2023-01-13 DIAGNOSIS — O209 Hemorrhage in early pregnancy, unspecified: Secondary | ICD-10-CM

## 2023-01-13 NOTE — Telephone Encounter (Signed)
Called pt to discuss her appt for 7/9, advised that we should do a follow up US at least 7-10 days from last [place] due to SAB and pt doing expected management, and then make a follow up visit with provider after repeat US to discuss next steps. Pt verbalizes and is okay to wait and get repeat scan at the end of this week, and informed that the front desk with reach out to her with both of her appts.

## 2023-01-16 ENCOUNTER — Ambulatory Visit (HOSPITAL_COMMUNITY)
Admission: RE | Admit: 2023-01-16 | Discharge: 2023-01-16 | Disposition: A | Discharge status: Home / Self Care | Payer: Medicaid Other | Source: Ambulatory Visit | Attending: Advanced Practice Midwife | Admitting: Advanced Practice Midwife

## 2023-01-16 ENCOUNTER — Other Ambulatory Visit: Payer: Self-pay | Admitting: Advanced Practice Midwife

## 2023-01-16 DIAGNOSIS — O039 Complete or unspecified spontaneous abortion without complication: Secondary | ICD-10-CM

## 2023-01-16 DIAGNOSIS — O209 Hemorrhage in early pregnancy, unspecified: Secondary | ICD-10-CM

## 2023-01-17 ENCOUNTER — Telehealth: Payer: Self-pay | Admitting: Advanced Practice Midwife

## 2023-01-17 ENCOUNTER — Telehealth: Payer: Self-pay | Admitting: Emergency Medicine

## 2023-01-17 MED ORDER — ONDANSETRON HCL 4 MG PO TABS: 4.0000 mg | ORAL_TABLET | Freq: Three times a day (TID) | ORAL | 0 refills | Status: DC | PRN

## 2023-01-17 MED ORDER — TRAMADOL HCL 50 MG PO TABS: 50.0000 mg | ORAL_TABLET | Freq: Four times a day (QID) | ORAL | 0 refills | Status: DC | PRN

## 2023-01-17 MED ORDER — MISOPROSTOL 200 MCG PO TABS: 200.0000 ug | ORAL_TABLET | Freq: Once | ORAL | 1 refills | Status: DC

## 2023-01-17 NOTE — Telephone Encounter (Signed)
I called patient today to review [place] results from 01/16/23.  I verified pt identity using 2 markers. Pt reports scant bleeding and no pain since MAU visit on 01/09/23.  [place] on 01/16/23 shows gestational sac and fetal pole still present, similar to [place] on 01/09/23, that was definitive for missed ab.  Pt preferred to keep scheduled appt at [facility] after MAU visit on 7/4 and have additional ultrasound for confirmation.  Options for treatment, including expectant management, Cytotec, and D&C were discussed by provider on 01/09/23 and I reviewed these again today after discussing the new US findings.  The pt elects to take Cytotec with follow up in the office. Hgb 11.4 on 01/09/23.  Rx for Cytotec, Zofran, and Tramadol sent to pt pharmacy. Message sent to [facility] to change 7/15 appt to 2-3 weeks for follow up post miscarriage.

## 2023-01-17 NOTE — Telephone Encounter (Signed)
Pt left voicemail asking about 7/11 ultrasound results.  Provider Sharen Counter contacted to review [place] and speak with patient about next steps.

## 2023-01-17 NOTE — Telephone Encounter (Signed)
Attempted return call to patient who has questions about results. LVM

## 2023-01-20 ENCOUNTER — Ambulatory Visit: Payer: Medicaid Other | Admitting: Obstetrics & Gynecology

## 2023-02-02 ENCOUNTER — Inpatient Hospital Stay (HOSPITAL_COMMUNITY): Payer: Medicaid Other

## 2023-02-02 ENCOUNTER — Encounter (HOSPITAL_COMMUNITY): Payer: Self-pay | Admitting: Family Medicine

## 2023-02-02 ENCOUNTER — Other Ambulatory Visit: Payer: Self-pay | Admitting: Certified Nurse Midwife

## 2023-02-02 ENCOUNTER — Inpatient Hospital Stay (HOSPITAL_COMMUNITY)
Admission: AD | Admit: 2023-02-02 | Discharge: 2023-02-02 | Disposition: A | Discharge status: Home / Self Care | Payer: Medicaid Other | Attending: Family Medicine | Admitting: Family Medicine

## 2023-02-02 DIAGNOSIS — O26891 Other specified pregnancy related conditions, first trimester: Secondary | ICD-10-CM | POA: Insufficient documentation

## 2023-02-02 DIAGNOSIS — O039 Complete or unspecified spontaneous abortion without complication: Secondary | ICD-10-CM

## 2023-02-02 DIAGNOSIS — O0941 Supervision of pregnancy with grand multiparity, first trimester: Secondary | ICD-10-CM | POA: Diagnosis not present

## 2023-02-02 DIAGNOSIS — Z3A11 11 weeks gestation of pregnancy: Secondary | ICD-10-CM

## 2023-02-02 DIAGNOSIS — R531 Weakness: Secondary | ICD-10-CM | POA: Insufficient documentation

## 2023-02-02 DIAGNOSIS — D62 Acute posthemorrhagic anemia: Secondary | ICD-10-CM

## 2023-02-02 DIAGNOSIS — O09521 Supervision of elderly multigravida, first trimester: Secondary | ICD-10-CM | POA: Insufficient documentation

## 2023-02-02 DIAGNOSIS — R519 Headache, unspecified: Secondary | ICD-10-CM | POA: Insufficient documentation

## 2023-02-02 DIAGNOSIS — Z8616 Personal history of COVID-19: Secondary | ICD-10-CM | POA: Insufficient documentation

## 2023-02-02 LAB — CBC
HCT: 28.8 % — ABNORMAL LOW (ref 36.0–46.0)
Hemoglobin: 9.6 g/dL — ABNORMAL LOW (ref 12.0–15.0)
MCH: 26.5 pg (ref 26.0–34.0)
MCHC: 33.3 g/dL (ref 30.0–36.0)
MCV: 79.6 fL — ABNORMAL LOW (ref 80.0–100.0)
Platelets: 174 10*3/uL (ref 150–400)
RBC: 3.62 MIL/uL — ABNORMAL LOW (ref 3.87–5.11)
RDW: 16.7 % — ABNORMAL HIGH (ref 11.5–15.5)
WBC: 7.4 10*3/uL (ref 4.0–10.5)
nRBC: 0 % (ref 0.0–0.2)

## 2023-02-02 LAB — BPAM RBC
Blood Product Expiration Date: 202408132359
Blood Product Expiration Date: 202408152359
Unit Type and Rh: 6200
Unit Type and Rh: 6200

## 2023-02-02 LAB — HCG, QUANTITATIVE, PREGNANCY: hCG, Beta Chain, Quant, S: 3499 m[IU]/mL — ABNORMAL HIGH (ref <5)

## 2023-02-02 LAB — TYPE AND SCREEN
ABO/RH(D): AB POS
Antibody Screen: POSITIVE
DAT, IgG: NEGATIVE
Donor AG Type: NEGATIVE
Donor AG Type: NEGATIVE
Unit division: 0
Unit division: 0

## 2023-02-02 MED ORDER — NIFEREX PO TABS: 1.0000 | ORAL_TABLET | Freq: Every day | ORAL | 2 refills | Status: DC

## 2023-02-02 MED ORDER — LACTATED RINGERS IV BOLUS
1000.0000 mL | Freq: Once | INTRAVENOUS | Status: AC
  Administered 2023-02-02: 1000 mL via INTRAVENOUS

## 2023-02-02 MED ORDER — MISOPROSTOL 200 MCG PO TABS
800.0000 ug | ORAL_TABLET | Freq: Once | ORAL | Status: AC
  Administered 2023-02-02: 800 ug via BUCCAL
  Filled 2023-02-02: qty 4

## 2023-02-02 MED ORDER — IBUPROFEN 800 MG PO TABS
800.0000 mg | ORAL_TABLET | Freq: Once | ORAL | Status: AC
  Administered 2023-02-02: 800 mg via ORAL
  Filled 2023-02-02: qty 1

## 2023-02-02 MED ORDER — ONDANSETRON HCL 4 MG/2ML IJ SOLN
4.0000 mg | Freq: Once | INTRAMUSCULAR | Status: AC
  Administered 2023-02-02: 4 mg via INTRAVENOUS
  Filled 2023-02-02: qty 2

## 2023-02-02 MED ORDER — MISOPROSTOL 200 MCG PO TABS: 200.0000 ug | ORAL_TABLET | Freq: Three times a day (TID) | ORAL | 0 refills | Status: DC

## 2023-02-02 MED ORDER — IBUPROFEN 600 MG PO TABS: 600.0000 mg | ORAL_TABLET | Freq: Four times a day (QID) | ORAL | 1 refills | Status: DC | PRN

## 2023-02-02 MED ORDER — OXYCODONE-ACETAMINOPHEN 5-325 MG PO TABS: 2.0000 | ORAL_TABLET | Freq: Once | ORAL | Status: DC

## 2023-02-02 MED ORDER — HYDROMORPHONE HCL 1 MG/ML IJ SOLN
1.0000 mg | Freq: Once | INTRAMUSCULAR | Status: AC
  Administered 2023-02-02: 1 mg via INTRAVENOUS
  Filled 2023-02-02: qty 1

## 2023-02-02 NOTE — MAU Note (Signed)
.  Marisa Campbell is a 42 y.o. at [redacted]w[redacted]d here in MAU reporting: cramping and vaginal bleeding-Pt took medication to induce miscarriage on July 12th. Reports light "period" bleeding for 2 weeks. Thursday bleeding resolved Friday began having red bleeding- Tonight (Saturday)2100-- felt severe cramping-passed baseball size and larger size clots 0215-- passed clot the size of her foot-every has cramping and "gushing of blood" clots are now smaller. Reports now feeling weak and severe headache-near syncopal episode prior to arrival  Pain score: headache 6  Vitals:   02/02/23 0521  BP: 128/66  Pulse: (!) 58  Resp: 18  Temp: 98.1 F (36.7 C)  SpO2: 100%

## 2023-02-02 NOTE — Progress Notes (Signed)
Orders placed for post-miscarriage iron infusion. Edd Arbour, CNM, MSN, IBCLC Certified Nurse Midwife, [facility]

## 2023-02-02 NOTE — MAU Provider Note (Addendum)
History   CSN: [id number]  Arrival date and time: 02/02/23 7829   Event Date/Time   First Provider Initiated Contact with Patient 02/02/23 0532     Chief Complaint  Patient presents with   Vaginal Bleeding   Near Syncope   Headache   Marisa Campbell is a 42 y.o. [id number] at [redacted]w[redacted]d who receives care at [facility].  She presents today for vaginal bleeding. Patient reports that she took cytotec July 12th and had been experiencing light bleeding. Patient reports she had sexual intercourse on Thursday as the bleeding had stopped. She reports on Friday she started bleeding again and thought she was having a regular period.  She reports experiencing heavier bleeding and passing clots by Saturday evening. She states one clot was as big as her foot.  She reports upon arrival she continued to have bleeding and passing clots that was about the size of a golf ball.  She reports some dizziness and weakness earlier today, but none currently. She reports some abdominal cramping that is 7-8/10.   OB History     Gravida  8   Para  6   Term  4   Preterm  2   AB  1   Living  6      SAB  0   IAB  1   Ectopic      Multiple  0   Live Births  6          Past Medical History:  Diagnosis Date   Anemia    COVID-19 01/31/2019   COVID-19 virus infection 02/01/2019   7/27, symptomatic   Gestational diabetes    Headache    History of cold sores 11/17/2020   HSV (herpes simplex virus) anogenital infection    oral    Hypothyroidism    on meds currently   Pregnancy induced hypertension    Vaginal Pap smear, abnormal    Past Surgical History:  Procedure Laterality Date   CESAREAN SECTION     CESAREAN SECTION N/A 06/01/2019   Procedure: CESAREAN SECTION;  Surgeon: Levi Aland, MD;  Location: [facility];  Service: Obstetrics;  Laterality: N/A;   Family History  Problem Relation Age of Onset   Hypertension Father    Social History   Tobacco Use   Smoking status: Never    Smokeless tobacco: Never  Vaping Use   Vaping status: Never Used  Substance Use Topics   Alcohol use: No   Drug use: No   Allergies: No Known Allergies  Medications Prior to Admission  Medication Sig Dispense Refill Last Dose   acetaminophen (TYLENOL) 160 MG chewable tablet Chew 160 mg by mouth every 6 (six) hours as needed for pain.   02/01/2023 at 2300   aspirin EC 81 MG tablet Take 81 mg by mouth daily. Swallow whole.   02/01/2023   labetalol (NORMODYNE) 200 MG tablet Take 1 tablet by mouth twice daily 180 tablet 0 02/01/2023   NP THYROID 90 MG tablet Take 90 mg by mouth every morning.   02/01/2023   Blood Pressure Monitoring (BLOOD PRESSURE KIT) DEVI 1 kit by Does not apply route once a week. Check Blood Pressure regularly and record readings into the Babyscripts App.  Large Cuff.  DX O90.0 (Patient not taking: Reported on 07/25/2022) 1 each 0    estradiol (ESTRACE VAGINAL) 0.1 MG/GM vaginal cream Place 1 Applicatorful vaginally at bedtime. (Patient not taking: Reported on 12/19/2022) 42.5 g 0    levothyroxine (SYNTHROID)  125 MCG tablet Take 1 tablet (125 mcg total) by mouth daily before breakfast. (Patient not taking: Reported on 12/19/2022) 90 tablet 0    misoprostol (CYTOTEC) 200 MCG tablet Take 1 tablet (200 mcg total) by mouth once for 1 dose. 800 tablet 1    ondansetron (ZOFRAN) 4 MG tablet Take 1 tablet (4 mg total) by mouth every 8 (eight) hours as needed for nausea or vomiting. 20 tablet 0    Prenatal Vit-Fe Fumarate-FA (PRENATAL VITAMIN PO) Take by mouth.      sertraline (ZOLOFT) 50 MG tablet Take 1 tablet (50 mg total) by mouth daily. (Patient not taking: Reported on 12/19/2022) 30 tablet 0    traMADol (ULTRAM) 50 MG tablet Take 1 tablet (50 mg total) by mouth every 6 (six) hours as needed. 6 tablet 0    valACYclovir (VALTREX) 1000 MG tablet TAKE 2 TABLETS BY MOUTH EVERY 12 HOURS FOR 1 DAY (Patient not taking: Reported on 12/19/2022)      Review of Systems  Gastrointestinal:  Positive  for abdominal pain (Cramping). Negative for constipation, diarrhea, nausea and vomiting.  Genitourinary:  Positive for vaginal bleeding. Negative for difficulty urinating, dysuria and vaginal discharge.   Physical Exam   Blood pressure (!) 108/52, pulse (!) 54, temperature 98.1 F (36.7 C), temperature source Oral, resp. rate 17, height 5\' 3"  (1.6 m), weight 289 lb 3.2 oz (131.2 kg), last menstrual period 11/15/2022, SpO2 99%, currently breastfeeding.  Physical Exam Vitals reviewed.  Constitutional:      General: She is not in acute distress.    Appearance: Normal appearance. She is not ill-appearing, toxic-appearing or diaphoretic.  HENT:     Head: Normocephalic and atraumatic.  Eyes:     Conjunctiva/sclera: Conjunctivae normal.  Pulmonary:     Effort: Pulmonary effort is normal. No respiratory distress.  Genitourinary:    Comments: No active bleeding noted from vagina.  Pad with scant amt blood noted.  Musculoskeletal:        General: Normal range of motion.     Cervical back: Normal range of motion.  Skin:    General: Skin is warm and dry.     Coloration: Skin is not pale.  Neurological:     Mental Status: She is alert.  Psychiatric:        Mood and Affect: Mood normal.        Behavior: Behavior normal.    MAU Course  Procedures Results for orders placed or performed during the hospital encounter of 02/02/23 (from the past 24 hour(s))  Type and screen     Status: None (Preliminary result)   Collection Time: 02/02/23  5:44 AM  Result Value Ref Range   ABO/RH(D) AB POS    Antibody Screen POS    Sample Expiration 02/05/2023,2359    DAT, IgG NEG    Antibody Identification      ANTI E ANTI c Performed at [facility], [address] [place], [place] [address]    Unit Number [id number]    Blood Component Type RED CELLS,LR    Unit division 00    Status of Unit ALLOCATED    Donor AG Type NEGATIVE FOR E ANTIGEN NEGATIVE FOR c ANTIGEN    Transfusion Status OK  TO TRANSFUSE    Crossmatch Result COMPATIBLE    Unit Number [id number]    Blood Component Type RED CELLS,LR    Unit division 00    Status of Unit ALLOCATED    Donor AG Type NEGATIVE  FOR E ANTIGEN NEGATIVE FOR c ANTIGEN    Transfusion Status OK TO TRANSFUSE    Crossmatch Result COMPATIBLE   hCG, quantitative, pregnancy     Status: Abnormal   Collection Time: 02/02/23  5:45 AM  Result Value Ref Range   hCG, Beta Chain, Quant, S 3,499 (H) <5 mIU/mL  CBC     Status: Abnormal   Collection Time: 02/02/23  5:45 AM  Result Value Ref Range   WBC 7.4 4.0 - 10.5 K/uL   RBC 3.62 (L) 3.87 - 5.11 MIL/uL   Hemoglobin 9.6 (L) 12.0 - 15.0 g/dL   HCT 09.8 (L) 11.9 - 14.7 %   MCV 79.6 (L) 80.0 - 100.0 fL   MCH 26.5 26.0 - 34.0 pg   MCHC 33.3 30.0 - 36.0 g/dL   RDW 82.9 (H) 56.2 - 13.0 %   Platelets 174 150 - 400 K/uL   nRBC 0.0 0.0 - 0.2 %   US OB Transvaginal  Result Date: 02/02/2023 CLINICAL DATA:  42 year old female with history of spontaneous abortion with heavy vaginal bleeding and cramping status post administration of Cytotec. EXAM: TRANSVAGINAL OB ULTRASOUND TECHNIQUE: Transvaginal ultrasound was performed for complete evaluation of the gestation as well as the maternal uterus, adnexal regions, and pelvic cul-de-sac. COMPARISON:  OB ultrasound 01/16/2023. FINDINGS: Intrauterine gestational sac: None Yolk sac:  None Embryo:  None Cardiac Activity: None Heart Rate: N/A Subchorionic hemorrhage:  None visualized. Maternal uterus/adnexae: Endometrium is heterogeneous in echotexture and appears thickened measuring up to 27 mm on today's examination. IMPRESSION: 1. Compared to the prior examination, previously noted gestational sac is no longer identified and has presumably passed. Endometrium is diffusely thickened and heterogeneous in appearance at this time. Electronically Signed   By: Trudie Reed M.D.   On: 02/02/2023 06:42    MDM Labs: T&S, hCG, CBC Ultrasound Pain Medication Start  IV LR Bolus Consult MDM & MAU Course  MDM: High  MAU Course: -Reviewed POC with patient. -Exam performed.  -Labs ordered. -Patient offered and declines pain medication. -Will send for [place] to assess.  Reassessment (6:19 AM) -Patient requesting pain medication. -Agreeable to ibuprofen.  -[place] and hCG results pending. -Discussed potential for additional cytotec dosing vs D&C depending upon [place] results. -Patient states she does not desire D&C. -Informed that we will discuss further and I will consult with MD once results return.   Reassessment ([id number]) -Nurse call reports patient with syncope episode. -Verbal telephone order to start IV and give fluids.  -Provider to bedside and patient reports she "just felt sick" and had nausea with pain medication.  -Will give nausea and pain medication. -[place] returns as above. -Dr. Gildardo Griffes  consulted and informed of patient status, evaluation, interventions, and results. Advised: *Okay to give cytotec if patient desires. *Give and monitor in MAU. *Inform patient she may require D&C if bleeding does not improve.  -Provider to bedside to discuss POC.  -Patient verbalizes understanding and questions regarding D&C and fertility addressed. -Cytotec to be ordered.  -Continue to monitor and reassess as appropriate.   0800 Report given to J.Rhoderick Farrel, CNM for assumption of care.  Gerrit Heck, MSN, CNM Advanced Practice Provider, Center for [facility]  Assumed care of patient, had an additional clot when up to the restroom but bleeding has overall slowed. Pt able to get up and ambulate to the bathroom twice without syncope or dizziness. Orthostatic blood pressures normal.   Pt amenable to going home to rest, eat and take ibuprofen as needed  for cramping. Bleeding should continue to ease into more of a normal period flow, but prescribed two days of 200mg  cytotec TID with meals to help facilitate continued clearing of uterine contents while  controlling bleeding. Has follow up scheduled at [facility] on 02/12/23 with Dr. Debroah Loop - advised to keep this visit. Also placed infusion orders for venofer at [facility].   Assessment & Plan  42 y.o. at [redacted]w[redacted]d  Miscarriage - Plan: Discharge patient  Acute blood loss anemia - Plan: Discharge patient   Allergies as of 02/02/2023   No Known Allergies      Medication List     TAKE these medications    acetaminophen 160 MG chewable tablet Commonly known as: TYLENOL Chew 160 mg by mouth every 6 (six) hours as needed for pain.   aspirin EC 81 MG tablet Take 81 mg by mouth daily. Swallow whole.   Blood Pressure Kit Devi 1 kit by Does not apply route once a week. Check Blood Pressure regularly and record readings into the Babyscripts App.  Large Cuff.  DX O90.0   estradiol 0.1 MG/GM vaginal cream Commonly known as: ESTRACE VAGINAL Place 1 Applicatorful vaginally at bedtime.   ibuprofen 600 MG tablet Commonly known as: ADVIL Take 1 tablet (600 mg total) by mouth every 6 (six) hours as needed.   labetalol 200 MG tablet Commonly known as: NORMODYNE Take 1 tablet by mouth twice daily   levothyroxine 125 MCG tablet Commonly known as: SYNTHROID Take 1 tablet (125 mcg total) by mouth daily before breakfast.   misoprostol 200 MCG tablet Commonly known as: Cytotec Take 1 tablet (200 mcg total) by mouth once for 1 dose. What changed: Another medication with the same name was added. Make sure you understand how and when to take each.   misoprostol 200 MCG tablet Commonly known as: Cytotec Take 1 tablet (200 mcg total) by mouth 3 (three) times daily after meals. What changed: You were already taking a medication with the same name, and this prescription was added. Make sure you understand how and when to take each.   Niferex Tabs Take 1 tablet by mouth daily after breakfast.   NP Thyroid 90 MG tablet Generic drug: thyroid Take 90 mg by mouth every morning.   ondansetron 4 MG  tablet Commonly known as: Zofran Take 1 tablet (4 mg total) by mouth every 8 (eight) hours as needed for nausea or vomiting.   PRENATAL VITAMIN PO Take by mouth.   sertraline 50 MG tablet Commonly known as: Zoloft Take 1 tablet (50 mg total) by mouth daily.   traMADol 50 MG tablet Commonly known as: ULTRAM Take 1 tablet (50 mg total) by mouth every 6 (six) hours as needed.   valACYclovir 1000 MG tablet Commonly known as: VALTREX TAKE 2 TABLETS BY MOUTH EVERY 12 HOURS FOR 1 DAY        Future Appointments  Date Time Provider Department Center  02/12/2023  2:50 PM Adam Phenix, MD [facility] None   Edd Arbour, CNM, MSN, [facility] Certified Nurse Midwife, [facility]    .

## 2023-02-02 NOTE — MAU Note (Signed)
Pt called RN into room stating she felt nauseous and felt like she was going to pass out. RN at bedside to help pt. RN called charge RN and CNM to bedside. RN starting an IV per verbal orders from CNM. CNM at bedside. See new orders.

## 2023-02-03 ENCOUNTER — Telehealth: Payer: Self-pay | Admitting: Pharmacy Technician

## 2023-02-03 ENCOUNTER — Telehealth: Payer: Self-pay

## 2023-02-03 NOTE — Telephone Encounter (Signed)
Called [place] office at [facility] for [employer] [phone]). Spoke with Nash Dimmer, RN, requesting clarification of IV iron order for this patient. Nash Dimmer clarified that Edd Arbour ordered two doses of Venofer 500 mg for this patient. This RN read back and verbalized understanding. Updated [facility] clinical pharmacy team with this information.  Called patient and scheduled her for her first infusion tomorrow, 02/04/23.  Wyvonne Lenz, RN

## 2023-02-03 NOTE — Telephone Encounter (Signed)
error 

## 2023-02-03 NOTE — Telephone Encounter (Addendum)
Marisa Campbell note Patient will be scheduled as soon as possible  Clarification needed, will pt be getting venofer 500mg  x2 doses?  Auth Submission: NO AUTH NEEDED Site of care: Site of care: [facility] Payer: [employer] [place] [employer] Medication & CPT/J Code(s) submitted: Venofer (Iron Sucrose) J1756 Route of submission (phone, fax, portal):  Phone # Fax # Auth type: AP-HB Units/visits requested: 2 doses Reference number:  Approval from: 02/03/23 to 06/06/23

## 2023-02-04 ENCOUNTER — Ambulatory Visit (INDEPENDENT_AMBULATORY_CARE_PROVIDER_SITE_OTHER): Payer: Medicaid Other

## 2023-02-04 VITALS — BP 122/79 | HR 65 | Temp 98.0°F | Resp 18 | Ht 63.0 in | Wt 291.2 lb

## 2023-02-04 DIAGNOSIS — D62 Acute posthemorrhagic anemia: Secondary | ICD-10-CM | POA: Diagnosis not present

## 2023-02-04 MED ORDER — ACETAMINOPHEN 325 MG PO TABS
650.0000 mg | ORAL_TABLET | Freq: Once | ORAL | Status: AC
  Administered 2023-02-04: 650 mg via ORAL
  Filled 2023-02-04: qty 2

## 2023-02-04 MED ORDER — IRON SUCROSE 500 MG IVPB - SIMPLE MED
500.0000 mg | Freq: Once | INTRAVENOUS | Status: AC
  Administered 2023-02-04: 500 mg via INTRAVENOUS
  Filled 2023-02-04: qty 200

## 2023-02-04 MED ORDER — DIPHENHYDRAMINE HCL 25 MG PO CAPS
25.0000 mg | ORAL_CAPSULE | Freq: Once | ORAL | Status: AC
  Administered 2023-02-04: 25 mg via ORAL
  Filled 2023-02-04: qty 1

## 2023-02-04 NOTE — Progress Notes (Signed)
Diagnosis: Iron Deficiency Anemia  Provider:  Chilton Greathouse MD  Procedure: IV Infusion  IV Type: Peripheral, IV Location: R Hand  Venofer (Iron Sucrose), Dose: 500 mg  Infusion Start Time: 1025  Infusion Stop Time: 1500  Post Infusion IV Care: Observation period completed and Peripheral IV Discontinued  Discharge: Condition: Good, Destination: Home . AVS Provided  Performed by:  Adriana Mccallum, RN

## 2023-02-04 NOTE — Patient Instructions (Signed)
Iron Sucrose Injection What is this medication? IRON SUCROSE (EYE ern SOO krose) treats low levels of iron (iron deficiency anemia) in people with kidney disease. Iron is a mineral that plays an important role in making red blood cells, which carry oxygen from your lungs to the rest of your body. This medicine may be used for other purposes; ask your health care provider or pharmacist if you have questions. COMMON BRAND NAME(S): Venofer What should I tell my care team before I take this medication? They need to know if you have any of these conditions: Anemia not caused by low iron levels Heart disease High levels of iron in the blood Kidney disease Liver disease An unusual or allergic reaction to iron, other medications, foods, dyes, or preservatives Pregnant or trying to get pregnant Breastfeeding How should I use this medication? This medication is for infusion into a vein. It is given in a hospital or clinic setting. Talk to your care team about the use of this medication in children. While this medication may be prescribed for children as young as 2 years for selected conditions, precautions do apply. Overdosage: If you think you have taken too much of this medicine contact a poison control center or emergency room at once. NOTE: This medicine is only for you. Do not share this medicine with others. What if I miss a dose? Keep appointments for follow-up doses. It is important not to miss your dose. Call your care team if you are unable to keep an appointment. What may interact with this medication? Do not take this medication with any of the following: Deferoxamine Dimercaprol Other iron products This medication may also interact with the following: Chloramphenicol Deferasirox This list may not describe all possible interactions. Give your health care provider a list of all the medicines, herbs, non-prescription drugs, or dietary supplements you use. Also tell them if you smoke,  drink alcohol, or use illegal drugs. Some items may interact with your medicine. What should I watch for while using this medication? Visit your care team regularly. Tell your care team if your symptoms do not start to get better or if they get worse. You may need blood work done while you are taking this medication. You may need to follow a special diet. Talk to your care team. Foods that contain iron include: whole grains/cereals, dried fruits, beans, or peas, leafy green vegetables, and organ meats (liver, kidney). What side effects may I notice from receiving this medication? Side effects that you should report to your care team as soon as possible: Allergic reactions--skin rash, itching, hives, swelling of the face, lips, tongue, or throat Low blood pressure--dizziness, feeling faint or lightheaded, blurry vision Shortness of breath Side effects that usually do not require medical attention (report to your care team if they continue or are bothersome): Flushing Headache Joint pain Muscle pain Nausea Pain, redness, or irritation at injection site This list may not describe all possible side effects. Call your doctor for medical advice about side effects. You may report side effects to FDA at 1-800-FDA-1088. Where should I keep my medication? This medication is given in a hospital or clinic. It will not be stored at home. NOTE: This sheet is a summary. It may not cover all possible information. If you have questions about this medicine, talk to your doctor, pharmacist, or health care provider.  2024 Elsevier/Gold Standard (2022-11-29 00:00:00)

## 2023-02-09 ENCOUNTER — Ambulatory Visit
Admission: EM | Admit: 2023-02-09 | Discharge: 2023-02-09 | Disposition: A | Discharge status: Home / Self Care | Payer: Medicaid Other | Attending: Physician Assistant | Admitting: Physician Assistant

## 2023-02-09 DIAGNOSIS — R519 Headache, unspecified: Secondary | ICD-10-CM

## 2023-02-09 NOTE — ED Provider Notes (Signed)
[name]    CSN: [id number] Arrival date & time: 02/09/23  0803      History   Chief Complaint Chief Complaint  Patient presents with   Blood Pressure Check    HPI Marisa Campbell is a 42 y.o. female.   Patient here today for evaluation of continued headaches.  She states that she recently had miscarriage and had anemia that required iron infusion.  She states that since this time she has had headache.  She also reports becoming easily fatigued when active.  She reports only minimal vaginal spotting at this point.  She is concerned because her blood pressure was low with prior checkups.  She has taken Tylenol without resolution of headache.  She was taking ibuprofen as well but discontinued as she was concerned to continue taking both.  The history is provided by the patient.    Past Medical History:  Diagnosis Date   Anemia    COVID-19 01/31/2019   COVID-19 virus infection 02/01/2019   7/27, symptomatic   Gestational diabetes    Headache    History of cold sores 11/17/2020   HSV (herpes simplex virus) anogenital infection    oral    Hypothyroidism    on meds currently   Pregnancy induced hypertension    Vaginal Pap smear, abnormal     Patient Active Problem List   Diagnosis Date Noted   Acute blood loss anemia 02/02/2023   Maternal age 94+, multigravida, antepartum 08/28/2022   Elevated blood-pressure reading without diagnosis of hypertension 08/28/2022   COVID-19 08/28/2022   History of cesarean section 08/28/2022   Hypothyroidism in pregnancy 08/28/2022   Bradycardia with 51-60 beats per minute 11/13/2020   Severe preeclampsia, third trimester 11/10/2020   Carpal tunnel syndrome of right wrist 10/19/2020   Gestational diabetes 10/13/2020   Maternal morbid obesity, antepartum (HCC) 10/12/2020   History of 2 cesarean sections 09/14/2020   Advanced maternal age in multigravida 09/07/2020   Chronic hypertension with superimposed severe  preeclampsia 08/17/2020   Hypothyroidism 08/13/2019   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   Herpes labialis 07/22/2017   CIN I (cervical intraepithelial neoplasia I) 05/24/2016   Low grade squamous intraepithelial lesion on cytologic smear of cervix (LGSIL) 05/17/2016    Past Surgical History:  Procedure Laterality Date   CESAREAN SECTION     CESAREAN SECTION N/A 06/01/2019   Procedure: CESAREAN SECTION;  Surgeon: Levi Aland, MD;  Location: [facility];  Service: Obstetrics;  Laterality: N/A;    OB History     Gravida  8   Para  6   Term  4   Preterm  2   AB  1   Living  6      SAB  0   IAB  1   Ectopic      Multiple  0   Live Births  6            Home Medications    Prior to Admission medications   Medication Sig Start Date End Date Taking? Authorizing Provider  acetaminophen (TYLENOL) 160 MG chewable tablet Chew 160 mg by mouth every 6 (six) hours as needed for pain.   Yes [provider]  aspirin EC 81 MG tablet Take 81 mg by mouth daily. Swallow whole.   Yes [provider]  labetalol (NORMODYNE) 200 MG tablet Take 1 tablet by mouth twice daily 01/08/23  Yes Georganna Skeans, MD  NP THYROID 90 MG tablet Take  90 mg by mouth every morning. 11/28/22  Yes [provider]  Prenatal Vit-Fe Fumarate-FA (PRENATAL VITAMIN PO) Take by mouth.   Yes [provider]  Blood Pressure Monitoring (BLOOD PRESSURE KIT) DEVI 1 kit by Does not apply route once a week. Check Blood Pressure regularly and record readings into the Babys[employer] App.  Large Cuff.  DX O90.0 Patient not taking: Reported on 07/25/2022 08/17/20   Warden Fillers, MD  estradiol (ESTRACE VAGINAL) 0.1 MG/GM vaginal cream Place 1 Applicatorful vaginally at bedtime. Patient not taking: Reported on 12/19/2022 10/25/21   Georganna Skeans, MD  ibuprofen (ADVIL) 600 MG tablet Take 1 tablet (600 mg total) by mouth every 6 (six) hours as needed. 02/02/23   Bernerd Limbo,  CNM  Iron Combinations (NIFEREX) TABS Take 1 tablet by mouth daily after breakfast. 02/02/23   Bernerd Limbo, CNM  levothyroxine (SYNTHROID) 125 MCG tablet Take 1 tablet (125 mcg total) by mouth daily before breakfast. Patient not taking: Reported on 12/19/2022 07/12/22   Georganna Skeans, MD  misoprostol (CYTOTEC) 200 MCG tablet Take 1 tablet (200 mcg total) by mouth once for 1 dose. 01/17/23 01/17/23  Leftwich-Kirby, Wilmer Floor, CNM  misoprostol (CYTOTEC) 200 MCG tablet Take 1 tablet (200 mcg total) by mouth 3 (three) times daily after meals. 02/02/23   Bernerd Limbo, CNM  ondansetron (ZOFRAN) 4 MG tablet Take 1 tablet (4 mg total) by mouth every 8 (eight) hours as needed for nausea or vomiting. 01/17/23   Leftwich-Kirby, Wilmer Floor, CNM  sertraline (ZOLOFT) 50 MG tablet Take 1 tablet (50 mg total) by mouth daily. Patient not taking: Reported on 12/19/2022 07/25/22   Georganna Skeans, MD  traMADol (ULTRAM) 50 MG tablet Take 1 tablet (50 mg total) by mouth every 6 (six) hours as needed. 01/17/23   Leftwich-Kirby, Wilmer Floor, CNM  valACYclovir (VALTREX) 1000 MG tablet TAKE 2 TABLETS BY MOUTH EVERY 12 HOURS FOR 1 DAY Patient not taking: Reported on 12/19/2022    [provider]    Family History Family History  Problem Relation Age of Onset   Hypertension Father     Social History Social History   Tobacco Use   Smoking status: Never   Smokeless tobacco: Never  Vaping Use   Vaping status: Never Used  Substance Use Topics   Alcohol use: No   Drug use: No     Allergies   Patient has no known allergies.   Review of Systems Review of Systems  Constitutional:  Negative for chills and fever.  Eyes:  Negative for discharge, redness and visual disturbance.  Respiratory:  Negative for shortness of breath.   Gastrointestinal:  Negative for abdominal pain, nausea and vomiting.  Neurological:  Positive for headaches. Negative for numbness.     Physical Exam Triage Vital Signs ED Triage  Vitals  Encounter Vitals Group     BP      Systolic BP Percentile      Diastolic BP Percentile      Pulse      Resp      Temp      Temp src      SpO2      Weight      Height      Head Circumference      Peak Flow      Pain Score      Pain Loc      Pain Education      Exclude from Growth Chart  No data found.  Updated Vital Signs BP 128/77 (BP Location: Left Arm)   Pulse 68   Temp 98.6 F (37 C) (Oral)   Resp 18   Ht 5\' 3"  (1.6 m)   Wt 286 lb (129.7 kg)   LMP  (LMP Unknown) Comment: Still lightly bleeding to recnet miscarriage.  SpO2 98%   Breastfeeding Unknown   BMI 50.66 kg/m       Physical Exam Vitals and nursing note reviewed.  Constitutional:      General: She is not in acute distress.    Appearance: Normal appearance. She is not ill-appearing.  HENT:     Head: Normocephalic and atraumatic.  Eyes:     Conjunctiva/sclera: Conjunctivae normal.  Cardiovascular:     Rate and Rhythm: Normal rate and regular rhythm.     Heart sounds: Normal heart sounds.  Pulmonary:     Effort: Pulmonary effort is normal. No respiratory distress.     Breath sounds: Normal breath sounds. No wheezing, rhonchi or rales.  Neurological:     Mental Status: She is alert.  Psychiatric:        Mood and Affect: Mood normal.        Behavior: Behavior normal.        Thought Content: Thought content normal.      UC Treatments / Results  Labs (all labs ordered are listed, but only abnormal results are displayed) Labs Reviewed  COMPREHENSIVE METABOLIC PANEL  CBC WITH DIFFERENTIAL/PLATELET    EKG   Radiology No results found.  Procedures Procedures (including critical care time)  Medications Ordered in UC Medications - No data to display  Initial Impression / Assessment and Plan / UC Course  I have reviewed the triage vital signs and the nursing notes.  Pertinent labs & imaging results that were available during my care of the patient were reviewed by me and  considered in my medical decision making (see chart for details).    Advised patient most likely symptoms were related to anemia and will order repeat labs.  Encouraged Excedrin as this may be more beneficial for headache relief.  Recommended further evaluation in the emergency room with any worsening symptoms.  She does have follow-up in 3 days that was scheduled previously.  Final Clinical Impressions(s) / UC Diagnoses   Final diagnoses:  Acute nonintractable headache, unspecified headache type   Discharge Instructions   None    ED Prescriptions   None    PDMP not reviewed this encounter.   Serina, Nichter, PA-C 02/09/23 1348

## 2023-02-09 NOTE — ED Triage Notes (Signed)
"  I have had some stuff going on, on the 4th went to hospital, had spotting, had a miscarriage, on the 12 took the medication to abort, stayed bleeding for about 2 wk's, that thursday bleeding slowed down a lot, that Friday started with ha and cramping, this past Saturday started hemorrhaging blood and went to ED with admission until Sunday afternoon". D&C declined in hospital, fainting episode in hospital due to anemia and blood loss. This past Tuesday had an iron infusion, feel better right now but headache has been consistent and BP is really low".

## 2023-02-12 ENCOUNTER — Encounter: Payer: Self-pay | Admitting: Obstetrics & Gynecology

## 2023-02-12 ENCOUNTER — Ambulatory Visit (INDEPENDENT_AMBULATORY_CARE_PROVIDER_SITE_OTHER): Payer: Medicaid Other | Admitting: Obstetrics & Gynecology

## 2023-02-12 VITALS — BP 131/81 | HR 55 | Ht 63.0 in | Wt 291.0 lb

## 2023-02-12 DIAGNOSIS — D62 Acute posthemorrhagic anemia: Secondary | ICD-10-CM | POA: Diagnosis not present

## 2023-02-12 DIAGNOSIS — Z3A12 12 weeks gestation of pregnancy: Secondary | ICD-10-CM | POA: Diagnosis not present

## 2023-02-12 DIAGNOSIS — O039 Complete or unspecified spontaneous abortion without complication: Secondary | ICD-10-CM

## 2023-02-12 NOTE — Progress Notes (Signed)
42 y.o GYN presents for SAB FU.  Pt stopped bleeding yesterday, c/o headache 3-8/10 x 2 weeks.

## 2023-02-12 NOTE — Progress Notes (Signed)
Patient ID: Marisa Campbell, female   DOB: 1980/10/29, 42 y.o.   MRN: [id number] Ultrasounds Results Note  SUBJECTIVE HPI:  [name] is a 42 y.o. [id number] at [redacted]w[redacted]d by LMP who presents to [facility] for followup of complete miscarriage after cytotec The patient denies abdominal pain, and her vaginal bleeding is resolving Upon review of the patient's records, patient was first seen in [facility] on 7/5 for bleeding.   BHCG on that day was 62952.  Ultrasound showed 6.5 week IUP.  Last seen in [facility]U on 7/48. BHCG was 3499.  She was s/p cytotec for missed Ab Past Medical History:  Diagnosis Date   Anemia    COVID-19 01/31/2019   COVID-19 virus infection 02/01/2019   7/27, symptomatic   Gestational diabetes    Headache    History of cold sores 11/17/2020   HSV (herpes simplex virus) anogenital infection    oral    Hypothyroidism    on meds currently   Pregnancy induced hypertension    Vaginal Pap smear, abnormal    Past Surgical History:  Procedure Laterality Date   CESAREAN SECTION     CESAREAN SECTION N/A 06/01/2019   Procedure: CESAREAN SECTION;  Surgeon: Levi Aland, MD;  Location: [facility];  Service: Obstetrics;  Laterality: N/A;   Social History   Socioeconomic History   Marital status: Single    Spouse name: Lexicographer   Number of children: Not on file   Years of education: Not on file   Highest education level: Not on file  Occupational History   Not on file  Tobacco Use   Smoking status: Never   Smokeless tobacco: Never  Vaping Use   Vaping status: Never Used  Substance and Sexual Activity   Alcohol use: No   Drug use: No   Sexual activity: Yes    Partners: Male    Birth control/protection: None  Other Topics Concern   Not on file  Social History Narrative   Not on file   Social Determinants of Health   Financial Resource Strain: Not on file  Food Insecurity: No Food Insecurity (10/25/2020)   Hunger Vital Sign    Worried About Running  Out of Food in the Last Year: Never true    Ran Out of Food in the Last Year: Never true  Transportation Needs: Not on file  Physical Activity: Not on file  Stress: Not on file  Social Connections: Not on file  Intimate Partner Violence: Not on file   Current Outpatient Medications on File Prior to Visit  Medication Sig Dispense Refill   acetaminophen (TYLENOL) 160 MG chewable tablet Chew 160 mg by mouth every 6 (six) hours as needed for pain.     aspirin EC 81 MG tablet Take 81 mg by mouth daily. Swallow whole.     Blood Pressure Monitoring (BLOOD PRESSURE KIT) DEVI 1 kit by Does not apply route once a week. Check Blood Pressure regularly and record readings into the Babys[employer] App.  Large Cuff.  DX O90.0 (Patient not taking: Reported on 07/25/2022) 1 each 0   estradiol (ESTRACE VAGINAL) 0.1 MG/GM vaginal cream Place 1 Applicatorful vaginally at bedtime. (Patient not taking: Reported on 12/19/2022) 42.5 g 0   ibuprofen (ADVIL) 600 MG tablet Take 1 tablet (600 mg total) by mouth every 6 (six) hours as needed. 30 tablet 1   Iron Combinations (NIFEREX) TABS Take 1 tablet by mouth daily after breakfast. 30 tablet 2  labetalol (NORMODYNE) 200 MG tablet Take 1 tablet by mouth twice daily 180 tablet 0   levothyroxine (SYNTHROID) 125 MCG tablet Take 1 tablet (125 mcg total) by mouth daily before breakfast. (Patient not taking: Reported on 12/19/2022) 90 tablet 0   misoprostol (CYTOTEC) 200 MCG tablet Take 1 tablet (200 mcg total) by mouth once for 1 dose. 800 tablet 1   misoprostol (CYTOTEC) 200 MCG tablet Take 1 tablet (200 mcg total) by mouth 3 (three) times daily after meals. 6 tablet 0   NP THYROID 90 MG tablet Take 90 mg by mouth every morning.     ondansetron (ZOFRAN) 4 MG tablet Take 1 tablet (4 mg total) by mouth every 8 (eight) hours as needed for nausea or vomiting. 20 tablet 0   Prenatal Vit-Fe Fumarate-FA (PRENATAL VITAMIN PO) Take by mouth.     sertraline (ZOLOFT) 50 MG tablet Take 1  tablet (50 mg total) by mouth daily. (Patient not taking: Reported on 12/19/2022) 30 tablet 0   traMADol (ULTRAM) 50 MG tablet Take 1 tablet (50 mg total) by mouth every 6 (six) hours as needed. 6 tablet 0   valACYclovir (VALTREX) 1000 MG tablet TAKE 2 TABLETS BY MOUTH EVERY 12 HOURS FOR 1 DAY (Patient not taking: Reported on 12/19/2022)     No current facility-administered medications on file prior to visit.   No Known Allergies  I have reviewed patient's Past Medical Hx, Surgical Hx, Family Hx, Social Hx, medications and allergies.   Review of Systems Review of Systems  Constitutional: Negative for fever and chills.  Gastrointestinal: Negative for nausea, vomiting, abdominal pain, diarrhea and constipation.  Genitourinary: Negative for dysuria.  Musculoskeletal: Negative for back pain.  Neurological: Negative for dizziness and weakness.    Physical Exam  BP 131/81   Pulse (!) 55   Ht 5\' 3"  (1.6 m)   Wt 291 lb (132 kg)   LMP  (LMP Unknown) Comment: Still lightly bleeding to recnet miscarriage.  Breastfeeding No Comment: SAB 02/01/2023  BMI 51.55 kg/m   GENERAL: Well-developed, well-nourished female in no acute distress.  HEENT: Normocephalic, atraumatic.   LUNGS: Effort normal ABDOMEN: soft, non-tender HEART: Regular rate  SKIN: Warm, dry and without erythema PSYCH: Normal mood and affect NEURO: Alert and oriented x 4  LAB RESULTS No results found for this or any previous visit (from the past 24 hour(s)).  IMAGING US OB Transvaginal  Result Date: 02/02/2023 CLINICAL DATA:  42 year old female with history of spontaneous abortion with heavy vaginal bleeding and cramping status post administration of Cytotec. EXAM: TRANSVAGINAL OB ULTRASOUND TECHNIQUE: Transvaginal ultrasound was performed for complete evaluation of the gestation as well as the maternal uterus, adnexal regions, and pelvic cul-de-sac. COMPARISON:  OB ultrasound 01/16/2023. FINDINGS: Intrauterine gestational sac:  None Yolk sac:  None Embryo:  None Cardiac Activity: None Heart Rate: N/A Subchorionic hemorrhage:  None visualized. Maternal uterus/adnexae: Endometrium is heterogeneous in echotexture and appears thickened measuring up to 27 mm on today's examination. IMPRESSION: 1. Compared to the prior examination, previously noted gestational sac is no longer identified and has presumably passed. Endometrium is diffusely thickened and heterogeneous in appearance at this time. Electronically Signed   By: Trudie Reed M.D.   On: 02/02/2023 06:42   US OB LESS THAN 14 WEEKS WITH OB TRANSVAGINAL  Result Date: 01/17/2023 CLINICAL DATA:  Spontaneous abortion follow-up.  Vaginal bleeding. EXAM: OBSTETRIC <14 WK [place] AND TRANSVAGINAL OB US TECHNIQUE: Both transabdominal and transvaginal ultrasound examinations were performed for complete evaluation  of the gestation as well as the maternal uterus, adnexal regions, and pelvic cul-de-sac. Transvaginal technique was performed to assess early pregnancy. COMPARISON:  01/09/2023 when failed pregnancy was diagnosed. FINDINGS: Persisting intrauterine gestational sac with mean sac diameter of 27.8 mm. No subchorionic hemorrhage. On clips a small fetal pole is again seen. Intramural fibroid with hypoechoic partially shadowing appearance measuring up to 2.5 cm. Negative adnexa. No pelvic fluid. IMPRESSION: Persisting intrauterine gestational sac. No subchorionic hemorrhage. Electronically Signed   By: Tiburcio Pea M.D.   On: 01/17/2023 10:30    ASSESSMENT 1. Miscarriage   2. Acute blood loss anemia     PLAN Discharge home in stable condition Orders Placed This Encounter  Procedures   CBC   Beta hCG quant (ref lab)    Adam Phenix, MD  02/12/2023  4:01 PM

## 2023-02-24 ENCOUNTER — Ambulatory Visit (INDEPENDENT_AMBULATORY_CARE_PROVIDER_SITE_OTHER): Payer: Medicaid Other

## 2023-02-24 VITALS — BP 129/77 | HR 56 | Temp 98.0°F | Resp 16

## 2023-02-24 DIAGNOSIS — D62 Acute posthemorrhagic anemia: Secondary | ICD-10-CM

## 2023-02-24 MED ORDER — IRON SUCROSE 500 MG IVPB - SIMPLE MED
500.0000 mg | Freq: Once | INTRAVENOUS | Status: AC
  Administered 2023-02-24: 500 mg via INTRAVENOUS
  Filled 2023-02-24: qty 500

## 2023-02-24 MED ORDER — DIPHENHYDRAMINE HCL 25 MG PO CAPS
25.0000 mg | ORAL_CAPSULE | Freq: Once | ORAL | Status: AC
  Administered 2023-02-24: 25 mg via ORAL
  Filled 2023-02-24: qty 1

## 2023-02-24 MED ORDER — ACETAMINOPHEN 325 MG PO TABS
650.0000 mg | ORAL_TABLET | Freq: Once | ORAL | Status: AC
  Administered 2023-02-24: 650 mg via ORAL
  Filled 2023-02-24: qty 2

## 2023-02-24 NOTE — Progress Notes (Signed)
Diagnosis: Acute Anemia  Provider:  Chilton Greathouse MD  Procedure: IV Infusion  IV Type: Peripheral, IV Location: L Forearm  Venofer (Iron Sucrose), Dose: 500 mg  Infusion Start Time: 1013  Infusion Stop Time: 1402  Post Infusion IV Care: Patient declined observation. PIV removed  Discharge: Condition: Good, Destination: Home . AVS Declined  Performed by:  Wyvonne Lenz, RN

## 2023-03-04 ENCOUNTER — Other Ambulatory Visit: Payer: Self-pay | Admitting: Family Medicine

## 2023-03-04 DIAGNOSIS — E039 Hypothyroidism, unspecified: Secondary | ICD-10-CM

## 2023-03-04 NOTE — Telephone Encounter (Signed)
Medication Refill - Medication: NP THYROID 90 MG tablet   Has the patient contacted their pharmacy? No.   Preferred Pharmacy (with phone number or street name):  [facility] [address] [place]), [place] - [id number] Phone: [phone]  Fax: [phone]     Has the patient been seen for an appointment in the last year OR does the patient have an upcoming appointment? Yes.    Agent: Please be advised that RX refills may take up to 3 business days. We ask that you follow-up with your pharmacy.

## 2023-03-05 NOTE — Telephone Encounter (Signed)
Requested medication (s) are due for refill today: routing for review  Requested medication (s) are on the active medication list: yes  Last refill:  12/19/22  Future visit scheduled: no  Notes to clinic:  Unable to refill per protocol, last refill by historical provider.      Requested Prescriptions  Pending Prescriptions Disp Refills   NP THYROID 90 MG tablet      Sig: Take 1 tablet (90 mg total) by mouth every morning.     Endocrinology:  Hypothyroid Agents Passed - 03/04/2023  9:54 AM      Passed - TSH in normal range and within 360 days    TSH  Date Value Ref Range Status  10/22/2022 1.070 0.450 - 4.500 uIU/mL Final         Passed - Valid encounter within last 12 months    Recent Outpatient Visits           4 months ago Acute atopic conjunctivitis of right eye   [facility], MD   6 months ago Hypothyroidism, unspecified type   [facility], MD   7 months ago Hypertension, unspecified type   [facility], MD   1 year ago Acute pharyngitis, unspecified etiology   [facility], MD   1 year ago Vaginal dryness   [facility], MD

## 2023-04-09 ENCOUNTER — Other Ambulatory Visit: Payer: Self-pay | Admitting: Family Medicine

## 2023-04-11 ENCOUNTER — Other Ambulatory Visit: Payer: Self-pay | Admitting: Family Medicine

## 2023-05-12 ENCOUNTER — Ambulatory Visit: Payer: Self-pay | Admitting: *Deleted

## 2023-05-12 NOTE — Telephone Encounter (Signed)
Summary: sick for 2 months   Per agent: "Pt is calling reporting that she has been sick for 2 months. She believes she may have larngitisis. No available appts. Please advise"      Chief Complaint: Cough Symptoms: Productive cough, greenish, SOB with exertion, sore throat, voice hoarse.Wheezing at times Frequency: cough weekend "Been feeling bad for 2 months, this comes and goes. Pertinent Negatives: Patient denies fver presently, did have initially Disposition: [] ED /[x] Urgent Care (no appt availability in office) / [] Appointment(In office/virtual)/ []  [facility]/ [] Home Care/ [] Refused Recommended Disposition /[] [facility]/ []  Follow-up with PCP Additional Notes: no availability, UC advised. Care advise provided, verbalizes understanding.   Reason for Disposition  [1] MILD difficulty breathing (e.g., minimal/no SOB at rest, SOB with walking, pulse <100) AND [2] still present when not coughing  Answer Assessment - Initial Assessment Questions 1. ONSET: "When did the cough begin?"      weekend 2. SEVERITY: "How bad is the cough today?"      BAd 3. SPUTUM: "Describe the color of your sputum" (none, dry cough; clear, white, yellow, green)     Greenish 4. HEMOPTYSIS: "Are you coughing up any blood?" If so ask: "How much?" (flecks, streaks, tablespoons, etc.)     No 5. DIFFICULTY BREATHING: "Are you having difficulty breathing?" If Yes, ask: "How bad is it?" (e.g., mild, moderate, severe)    - MILD: No SOB at rest, mild SOB with walking, speaks normally in sentences, can lie down, no retractions, pulse < 100.    - MODERATE: SOB at rest, SOB with minimal exertion and prefers to sit, cannot lie down flat, speaks in phrases, mild retractions, audible wheezing, pulse 100-120.    - SEVERE: Very SOB at rest, speaks in single words, struggling to breathe, sitting hunched forward, retractions, pulse > 120      With coughing, exertion 6. FEVER: "Do you have a fever?" If  Yes, ask: "What is your temperature, how was it measured, and when did it start?"     no 10. OTHER SYMPTOMS: "Do you have any other symptoms?" (e.g., runny nose, wheezing, chest pain)       Voice hoarse, sore throat  Protocols used: Cough - Acute Productive-A-AH

## 2023-05-12 NOTE — Telephone Encounter (Signed)
FYI

## 2023-06-02 ENCOUNTER — Ambulatory Visit: Payer: Medicaid Other | Admitting: Advanced Practice Midwife

## 2023-06-03 ENCOUNTER — Encounter: Payer: Self-pay | Admitting: Family Medicine

## 2023-06-03 ENCOUNTER — Ambulatory Visit: Payer: Medicaid Other

## 2023-06-03 ENCOUNTER — Ambulatory Visit (INDEPENDENT_AMBULATORY_CARE_PROVIDER_SITE_OTHER): Payer: Medicaid Other | Admitting: Family Medicine

## 2023-06-03 VITALS — BP 149/85 | HR 65 | Temp 99.2°F | Resp 18 | Ht 63.0 in | Wt 290.8 lb

## 2023-06-03 DIAGNOSIS — I10 Essential (primary) hypertension: Secondary | ICD-10-CM | POA: Diagnosis not present

## 2023-06-03 DIAGNOSIS — R053 Chronic cough: Secondary | ICD-10-CM

## 2023-06-03 DIAGNOSIS — D509 Iron deficiency anemia, unspecified: Secondary | ICD-10-CM

## 2023-06-03 DIAGNOSIS — E039 Hypothyroidism, unspecified: Secondary | ICD-10-CM

## 2023-06-03 NOTE — Progress Notes (Signed)
Established Patient Office Visit  Subjective    Patient ID: Marisa Campbell, female    DOB: July 11, 1980  Age: 42 y.o. MRN: [id number]  CC:  Chief Complaint  Patient presents with   Follow-up    3 month, cough, cold    HPI Marisa Campbell presents for follow up of medical issues from the past several months. She reports that she had a pregnancy that eventually spontaneously aborted at about 8 weeks. She had other complications like hemorrhaging and lost a lot of blood. Patient reports that she only recently has felt near baseline. She now has been having a cough and cold for 3 months. Patient reports it is minimally productive. Denies fever/chills or viral sx. Denies known contacts or exposures. Patient reports sxs persist.   Outpatient Encounter Medications as of 06/03/2023  Medication Sig   aspirin EC 81 MG tablet Take 81 mg by mouth daily. Swallow whole.   Iron Combinations (NIFEREX) TABS Take 1 tablet by mouth daily after breakfast.   labetalol (NORMODYNE) 200 MG tablet Take 1 tablet by mouth twice daily   NP THYROID 90 MG tablet Take 90 mg by mouth every morning.   Prenatal Vit-Fe Fumarate-FA (PRENATAL VITAMIN PO) Take by mouth.   acetaminophen (TYLENOL) 160 MG chewable tablet Chew 160 mg by mouth every 6 (six) hours as needed for pain. (Patient not taking: Reported on 06/03/2023)   Blood Pressure Monitoring (BLOOD PRESSURE KIT) DEVI 1 kit by Does not apply route once a week. Check Blood Pressure regularly and record readings into the Babyscripts App.  Large Cuff.  DX O90.0 (Patient not taking: Reported on 07/25/2022)   estradiol (ESTRACE VAGINAL) 0.1 MG/GM vaginal cream Place 1 Applicatorful vaginally at bedtime. (Patient not taking: Reported on 12/19/2022)   ibuprofen (ADVIL) 600 MG tablet Take 1 tablet (600 mg total) by mouth every 6 (six) hours as needed. (Patient not taking: Reported on 06/03/2023)   levothyroxine (SYNTHROID) 125 MCG tablet Take 1 tablet (125 mcg total) by  mouth daily before breakfast. (Patient not taking: Reported on 12/19/2022)   misoprostol (CYTOTEC) 200 MCG tablet Take 1 tablet (200 mcg total) by mouth once for 1 dose.   misoprostol (CYTOTEC) 200 MCG tablet Take 1 tablet (200 mcg total) by mouth 3 (three) times daily after meals. (Patient not taking: Reported on 06/03/2023)   ondansetron (ZOFRAN) 4 MG tablet Take 1 tablet (4 mg total) by mouth every 8 (eight) hours as needed for nausea or vomiting. (Patient not taking: Reported on 06/03/2023)   traMADol (ULTRAM) 50 MG tablet Take 1 tablet (50 mg total) by mouth every 6 (six) hours as needed. (Patient not taking: Reported on 06/03/2023)   valACYclovir (VALTREX) 1000 MG tablet TAKE 2 TABLETS BY MOUTH EVERY 12 HOURS FOR 1 DAY (Patient not taking: Reported on 12/19/2022)   No facility-administered encounter medications on file as of 06/03/2023.    Past Medical History:  Diagnosis Date   Anemia    COVID-19 01/31/2019   COVID-19 virus infection 02/01/2019   7/27, symptomatic   Gestational diabetes    Headache    History of cold sores 11/17/2020   HSV (herpes simplex virus) anogenital infection    oral    Hypothyroidism    on meds currently   Pregnancy induced hypertension    Vaginal Pap smear, abnormal     Past Surgical History:  Procedure Laterality Date   CESAREAN SECTION     CESAREAN SECTION N/A 06/01/2019   Procedure: CESAREAN SECTION;  Surgeon: Levi Aland, MD;  Location: [facility];  Service: Obstetrics;  Laterality: N/A;    Family History  Problem Relation Age of Onset   Hypertension Father     Social History   Socioeconomic History   Marital status: Single    Spouse name: Marisa Campbell   Number of children: Not on file   Years of education: Not on file   Highest education level: Not on file  Occupational History   Not on file  Tobacco Use   Smoking status: Never   Smokeless tobacco: Never  Vaping Use   Vaping status: Never Used  Substance and Sexual  Activity   Alcohol use: No   Drug use: No   Sexual activity: Yes    Partners: Male    Birth control/protection: None  Other Topics Concern   Not on file  Social History Narrative   Not on file   Social Determinants of Health   Financial Resource Strain: Low Risk  (06/03/2023)   Overall Financial Resource Strain (CARDIA)    Difficulty of Paying Living Expenses: Not hard at all  Food Insecurity: No Food Insecurity (10/25/2020)   Hunger Vital Sign    Worried About Running Out of Food in the Last Year: Never true    Ran Out of Food in the Last Year: Never true  Transportation Needs: Not on file  Physical Activity: Sufficiently Active (06/03/2023)   Exercise Vital Sign    Days of Exercise per Week: 5 days    Minutes of Exercise per Session: 30 min  Stress: No Stress Concern Present (06/03/2023)   [employer] of Occupational Health - Occupational Stress Questionnaire    Feeling of Stress : Not at all  Social Connections: Socially Integrated (06/03/2023)   Social Connection and Isolation Panel [NHANES]    Frequency of Communication with Friends and Family: More than three times a week    Frequency of Social Gatherings with Friends and Family: Twice a week    Attends Religious Services: More than 4 times per year    Active Member of [employer] or Organizations: Yes    Attends Banker Meetings: More than 4 times per year    Marital Status: Married  Catering manager Violence: Not At Risk (06/03/2023)   Humiliation, Afraid, Rape, and Kick questionnaire    Fear of Current or Ex-Partner: No    Emotionally Abused: No    Physically Abused: No    Sexually Abused: No    Review of Systems  All other systems reviewed and are negative.       Objective    BP (!) 149/85 (BP Location: Right Arm, Patient Position: Sitting, Cuff Size: Large)   Pulse 65   Temp 99.2 F (37.3 C) (Oral)   Resp 18   Ht 5\' 3"  (1.6 m)   Wt 290 lb 12.8 oz (131.9 kg)   LMP 11/15/2022   SpO2  97%   BMI 51.51 kg/m   Physical Exam Vitals and nursing note reviewed.  Constitutional:      General: She is not in acute distress. Cardiovascular:     Rate and Rhythm: Normal rate and regular rhythm.  Pulmonary:     Effort: Pulmonary effort is normal.     Breath sounds: Normal breath sounds.  Abdominal:     Palpations: Abdomen is soft.     Tenderness: There is no abdominal tenderness.  Neurological:     General: No focal deficit present.     Mental  Status: She is alert and oriented to person, place, and time.  Psychiatric:        Behavior: Behavior normal. Behavior is cooperative.         Assessment & Plan:   Persistent cough -     DG Chest 1 View; Future -     Basic metabolic panel  Essential hypertension -     Basic metabolic panel  Hypothyroidism, unspecified type -     TSH + free T4  Iron deficiency anemia, unspecified iron deficiency anemia type -     CBC with Differential/Platelet     No follow-ups on file.   Tommie Raymond, MD

## 2023-06-04 LAB — CBC WITH DIFFERENTIAL/PLATELET
Basophils Absolute: 0 10*3/uL (ref 0.0–0.2)
Basos: 0 %
EOS (ABSOLUTE): 0.3 10*3/uL (ref 0.0–0.4)
Eos: 4 %
Hematocrit: 39.7 % (ref 34.0–46.6)
Hemoglobin: 13 g/dL (ref 11.1–15.9)
Immature Grans (Abs): 0 10*3/uL (ref 0.0–0.1)
Immature Granulocytes: 0 %
Lymphocytes Absolute: 1.9 10*3/uL (ref 0.7–3.1)
Lymphs: 27 %
MCH: 28.3 pg (ref 26.6–33.0)
MCHC: 32.7 g/dL (ref 31.5–35.7)
MCV: 86 fL (ref 79–97)
Monocytes Absolute: 0.5 10*3/uL (ref 0.1–0.9)
Monocytes: 7 %
Neutrophils Absolute: 4.5 10*3/uL (ref 1.4–7.0)
Neutrophils: 62 %
Platelets: 211 10*3/uL (ref 150–450)
RBC: 4.6 x10E6/uL (ref 3.77–5.28)
RDW: 13.8 % (ref 11.7–15.4)
WBC: 7.2 10*3/uL (ref 3.4–10.8)

## 2023-06-04 LAB — BASIC METABOLIC PANEL
BUN/Creatinine Ratio: 21 (ref 9–23)
BUN: 13 mg/dL (ref 6–24)
CO2: 23 mmol/L (ref 20–29)
Calcium: 9.4 mg/dL (ref 8.7–10.2)
Chloride: 103 mmol/L (ref 96–106)
Creatinine, Ser: 0.62 mg/dL (ref 0.57–1.00)
Glucose: 94 mg/dL (ref 70–99)
Potassium: 4.2 mmol/L (ref 3.5–5.2)
Sodium: 138 mmol/L (ref 134–144)
eGFR: 113 mL/min/{1.73_m2} (ref >59)

## 2023-06-04 LAB — TSH+FREE T4
Free T4: 1.02 ng/dL (ref 0.82–1.77)
TSH: 1.2 u[IU]/mL (ref 0.450–4.500)

## 2023-06-09 ENCOUNTER — Encounter: Payer: Self-pay | Admitting: Family Medicine

## 2023-06-27 ENCOUNTER — Ambulatory Visit (INDEPENDENT_AMBULATORY_CARE_PROVIDER_SITE_OTHER): Payer: Medicaid Other | Admitting: Obstetrics and Gynecology

## 2023-06-27 ENCOUNTER — Encounter: Payer: Self-pay | Admitting: Obstetrics and Gynecology

## 2023-06-27 VITALS — BP 156/85 | HR 61 | Wt 293.0 lb

## 2023-06-27 DIAGNOSIS — Z6841 Body Mass Index (BMI) 40.0 and over, adult: Secondary | ICD-10-CM

## 2023-06-27 DIAGNOSIS — E66813 Obesity, class 3: Secondary | ICD-10-CM | POA: Diagnosis not present

## 2023-06-27 DIAGNOSIS — Z3202 Encounter for pregnancy test, result negative: Secondary | ICD-10-CM

## 2023-06-27 DIAGNOSIS — N912 Amenorrhea, unspecified: Secondary | ICD-10-CM | POA: Diagnosis not present

## 2023-06-27 DIAGNOSIS — A630 Anogenital (venereal) warts: Secondary | ICD-10-CM

## 2023-06-27 LAB — POCT URINE PREGNANCY: Preg Test, Ur: NEGATIVE

## 2023-06-27 MED ORDER — IMIQUIMOD 5 % EX CREA: TOPICAL_CREAM | CUTANEOUS | 5 refills | Status: DC

## 2023-06-27 NOTE — Progress Notes (Unsigned)
Pt has some skin tags she would like examined.   Pt is trying for a pregnancy, pt is currently late for cycle. Pt is having some breast tenderness and cramping - pt would like upt today.

## 2023-06-27 NOTE — Progress Notes (Unsigned)
42 yo P6 presenting for evaluation of a warty legion on her buttock. 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 She also reports being late for her cycle with negative home pregnancy test. Patient is actively trying to conceive. She reports a monthly period lasting 4 days. She has been using ovulation predictor kits to help time intercourse. She is actively trying to lose weight through exercise and modifying her diet. Patient is without any other complaints  Past Medical History:  Diagnosis Date   Anemia    COVID-19 01/31/2019   COVID-19 virus infection 02/01/2019   7/27, symptomatic   Gestational diabetes    Headache    History of cold sores 11/17/2020   HSV (herpes simplex virus) anogenital infection    oral    Hypothyroidism    on meds currently   Pregnancy induced hypertension    Vaginal Pap smear, abnormal    Past Surgical History:  Procedure Laterality Date   CESAREAN SECTION     CESAREAN SECTION N/A 06/01/2019   Procedure: CESAREAN SECTION;  Surgeon: Levi Aland, MD;  Location: [facility];  Service: Obstetrics;  Laterality: N/A;   Family History  Problem Relation Age of Onset   Hypertension Father    Social History   Tobacco Use   Smoking status: Never   Smokeless tobacco: Never  Vaping Use   Vaping status: Never Used  Substance Use Topics   Alcohol use: No   Drug use: No   ROS See pertinent in HPI. All other systems reviewed and non contributory Blood pressure (!) 156/85, pulse 61, weight 293 lb (132.9 kg), last menstrual period 11/15/2022. GENERAL: Well-developed, well-nourished female in no acute distress.  ABDOMEN: Soft, nontender, nondistended. No organomegaly. 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 Chaperone present during the pelvic exam EXTREMITIES: No cyanosis, clubbing, or edema, 2+ distal pulses.  A/P 42 yo with condyloma and desire to conceive - Rx aldera provided - Patient referred  to nutritionist to assist with weight loss Patient to follow up on HTN - Advised patient to continue taking prenatal vitamins - Discussed increased risk of first trimester miscarriage with age due to genetic abnormality and patient is aware of that - RTC prn

## 2023-07-08 ENCOUNTER — Other Ambulatory Visit: Payer: Self-pay | Admitting: Family Medicine

## 2023-07-10 ENCOUNTER — Encounter (HOSPITAL_BASED_OUTPATIENT_CLINIC_OR_DEPARTMENT_OTHER): Payer: Self-pay

## 2023-07-10 ENCOUNTER — Ambulatory Visit (HOSPITAL_BASED_OUTPATIENT_CLINIC_OR_DEPARTMENT_OTHER)
Admission: RE | Admit: 2023-07-10 | Discharge: 2023-07-10 | Disposition: A | Discharge status: Home / Self Care | Payer: Medicaid Other | Source: Ambulatory Visit | Attending: Family Medicine | Admitting: Family Medicine

## 2023-07-10 DIAGNOSIS — Z1231 Encounter for screening mammogram for malignant neoplasm of breast: Secondary | ICD-10-CM | POA: Diagnosis present

## 2023-08-07 ENCOUNTER — Encounter: Payer: Medicaid Other | Attending: Family Medicine | Admitting: Skilled Nursing Facility1

## 2023-08-07 ENCOUNTER — Encounter: Payer: Self-pay | Admitting: Skilled Nursing Facility1

## 2023-08-07 VITALS — Ht 63.0 in | Wt 291.1 lb

## 2023-08-07 DIAGNOSIS — E669 Obesity, unspecified: Secondary | ICD-10-CM | POA: Diagnosis present

## 2023-08-07 NOTE — Progress Notes (Signed)
Medical Nutrition Therapy  Appointment Start time:  2:07  Appointment End time:  3:10  Primary concerns today: to lose weight   Referral diagnosis: [id number]   NUTRITION ASSESSMENT    Clinical Medical Hx: GDM, HTN, anemia, thyroid  Medications: see list Notable Signs/Symptoms: dry/change in texture hair, trouble losing weight, dry skin  Lifestyle & Dietary Hx  Body Composition Scale 08/07/2023  Current Body Weight 291.1  Total Body Fat % 48.9  Visceral Fat 20  Fat-Free Mass % 51   Total Body Water % 40  Muscle-Mass lbs 31.8  BMI 51.2  Body Fat Displacement          Torso  lbs 88.4         Left Leg  lbs 17.6         Right Leg  lbs 17.6         Left Arm  lbs 8.8         Right Arm   lbs 8.8   Pt states she has 6 children. Pt states she weighs herself daily. Pt states she does intermittent fasting starting at 10am and stopping at 6pm.   Estimated daily fluid intake: 100 oz Supplements:  Sleep: Stress / self-care: controlled  Current average weekly physical activity: Monday thru Friday: 30-45 minutes  and sometimes Sunday: states she gets 15000-16000 per day  24-Hr Dietary Recall: wakes at 6am and at gym by 7am: does not eat pork; pt states she stops eating at 6pm First Meal 6:45-7:45: pre-workout drink (10 cal: caffeine and protein) Snack 10am: eggs and sausage  Second Meal: fruit Snack: coffee + protein shake Third Meal 4pm: chicken and salsa + zucchini and squash  Snack: protein shake Beverages: herbalife tea (caeffiene and protein), water, crystal + water   NUTRITION INTERVENTION  Nutrition education (E-1) on the following topics:  Creation of balanced and diverse meals to increase the intake of nutrient-rich foods that provide essential vitamins, minerals, fiber, and phytonutrients  Variety of Fruits and Vegetables:  Aim for a colorful array of fruits and vegetables to ensure a wide range of nutrients. Include a mix of leafy greens, berries, citrus fruits,  cruciferous vegetables, and more. Whole Grains: Choose whole grains over refined grains. Examples include brown rice, quinoa, oats, whole wheat, and barley. Lean Proteins: Include lean sources of protein, such as poultry, fish, tofu, legumes, beans, lentils, and low-fat dairy products. Limit red and processed meats. Healthy Fats: Incorporate sources of healthy fats, including avocados, nuts, seeds, and olive oil. Limit saturated and trans fats found in fried and processed foods. Dairy or Dairy Alternatives: Choose low-fat or fat-free dairy products, or plant-based alternatives like almond or soy milk. Portion Control: Be mindful of portion sizes to avoid overeating. Pay attention to hunger and satisfaction cues. Limit Added Sugars: Minimize the consumption of sugary beverages, snacks, and desserts. Check food labels for added sugars and opt for natural sources of sweetness such as whole fruits. Hydration: Drink plenty of water throughout the day. Limit sugary drinks and excessive caffeine intake. Moderate Sodium Intake: Reduce the consumption of high-sodium foods. Use herbs and spices for flavor instead of excessive salt. Meal Planning and Preparation: Plan and prepare meals ahead of time to make healthier choices more convenient. Include a mix of food groups in each meal. Limit Processed Foods: Minimize the intake of highly processed and packaged foods that are often high in added sugars, salt, and unhealthy fats. Regular Physical Activity: Combine a healthy diet with regular physical activity  for overall well-being. Aim for at least 150 minutes of moderate-intensity aerobic exercise per week, along with strength training. Moderation and Balance: Enjoy treats and indulgent foods in moderation, emphasizing balance rather than strict restriction.  Handouts Provided Include  Detailed MyPlate  Learning Style & Readiness for Change Teaching method utilized: Visual & Auditory   Demonstrated degree of understanding via: Teach Back  Barriers to learning/adherence to lifestyle change: diet mentality   Goals Established by Pt I will create and consume balanced meals I will reduce my caffeine intake to under 200mg  I will reduce my protein intake to 80 grams per day   MONITORING & EVALUATION Dietary intake, weekly physical activity  Next Steps  Patient is to call or email with any future questions or concerns as pt does not desire further follow up at this time

## 2023-08-28 ENCOUNTER — Telehealth: Payer: Self-pay

## 2023-08-28 NOTE — Telephone Encounter (Signed)
Copied from CRM [id number]. Topic: Clinical - Medical Advice >> Aug 28, 2023  1:48 PM Marisa Campbell wrote: Reason for CRM: Pt wants to know if she has to do labs for her next thyroid medication refill? Please advise

## 2023-08-28 NOTE — Telephone Encounter (Signed)
 Routing to office

## 2023-09-04 ENCOUNTER — Encounter: Payer: Self-pay | Admitting: Family Medicine

## 2023-09-04 ENCOUNTER — Ambulatory Visit (INDEPENDENT_AMBULATORY_CARE_PROVIDER_SITE_OTHER): Payer: Medicaid Other | Admitting: Family Medicine

## 2023-09-04 VITALS — BP 155/96 | HR 57 | Temp 97.9°F | Resp 16 | Ht 63.0 in | Wt 292.4 lb

## 2023-09-04 DIAGNOSIS — Z3169 Encounter for other general counseling and advice on procreation: Secondary | ICD-10-CM | POA: Diagnosis not present

## 2023-09-04 DIAGNOSIS — I10 Essential (primary) hypertension: Secondary | ICD-10-CM | POA: Diagnosis not present

## 2023-09-04 DIAGNOSIS — Z6841 Body Mass Index (BMI) 40.0 and over, adult: Secondary | ICD-10-CM

## 2023-09-04 DIAGNOSIS — R030 Elevated blood-pressure reading, without diagnosis of hypertension: Secondary | ICD-10-CM

## 2023-09-04 DIAGNOSIS — E66813 Obesity, class 3: Secondary | ICD-10-CM | POA: Diagnosis not present

## 2023-09-08 ENCOUNTER — Encounter: Payer: Self-pay | Admitting: Family Medicine

## 2023-09-08 NOTE — Progress Notes (Signed)
 Established Patient Office Visit  Subjective    Patient ID: Marisa Campbell, female    DOB: Feb 26, 1981  Age: 43 y.o. MRN: [id number]  CC:  Chief Complaint  Patient presents with   Weight Loss    Medication refill    HPI Marisa Campbell presents for routine follow up of hypertension. Patient would also like to discuss weight loss options and infertility as she is trying to have another child and also had a spontaneous ab on last year.   Outpatient Encounter Medications as of 09/04/2023  Medication Sig   imiquimod (ALDARA) 5 % cream Apply topically 3 (three) times a week. Apply until total clearance or maximum of 16 weeks   Iron Combinations (NIFEREX) TABS Take 1 tablet by mouth daily after breakfast.   labetalol (NORMODYNE) 200 MG tablet Take 1 tablet by mouth twice daily   levothyroxine (SYNTHROID) 125 MCG tablet Take 1 tablet (125 mcg total) by mouth daily before breakfast.   NP THYROID 90 MG tablet Take 90 mg by mouth every morning.   Prenatal Vit-Fe Fumarate-FA (PRENATAL VITAMIN PO) Take by mouth.   acetaminophen (TYLENOL) 160 MG chewable tablet Chew 160 mg by mouth every 6 (six) hours as needed for pain. (Patient not taking: Reported on 06/03/2023)   aspirin EC 81 MG tablet Take 81 mg by mouth daily. Swallow whole.   Blood Pressure Monitoring (BLOOD PRESSURE KIT) DEVI 1 kit by Does not apply route once a week. Check Blood Pressure regularly and record readings into the Babyscripts App.  Large Cuff.  DX O90.0 (Patient not taking: Reported on 07/25/2022)   estradiol (ESTRACE VAGINAL) 0.1 MG/GM vaginal cream Place 1 Applicatorful vaginally at bedtime. (Patient not taking: Reported on 12/19/2022)   ibuprofen (ADVIL) 600 MG tablet Take 1 tablet (600 mg total) by mouth every 6 (six) hours as needed. (Patient not taking: Reported on 06/03/2023)   misoprostol (CYTOTEC) 200 MCG tablet Take 1 tablet (200 mcg total) by mouth once for 1 dose.   misoprostol (CYTOTEC) 200 MCG tablet Take 1  tablet (200 mcg total) by mouth 3 (three) times daily after meals. (Patient not taking: Reported on 06/03/2023)   ondansetron (ZOFRAN) 4 MG tablet Take 1 tablet (4 mg total) by mouth every 8 (eight) hours as needed for nausea or vomiting. (Patient not taking: Reported on 06/03/2023)   traMADol (ULTRAM) 50 MG tablet Take 1 tablet (50 mg total) by mouth every 6 (six) hours as needed. (Patient not taking: Reported on 06/03/2023)   valACYclovir (VALTREX) 1000 MG tablet TAKE 2 TABLETS BY MOUTH EVERY 12 HOURS FOR 1 DAY (Patient not taking: Reported on 12/19/2022)   No facility-administered encounter medications on file as of 09/04/2023.    Past Medical History:  Diagnosis Date   Anemia    COVID-19 01/31/2019   COVID-19 virus infection 02/01/2019   7/27, symptomatic   Gestational diabetes    Headache    History of cold sores 11/17/2020   HSV (herpes simplex virus) anogenital infection    oral    Hypothyroidism    on meds currently   Pregnancy induced hypertension    Vaginal Pap smear, abnormal     Past Surgical History:  Procedure Laterality Date   CESAREAN SECTION     CESAREAN SECTION N/A 06/01/2019   Procedure: CESAREAN SECTION;  Surgeon: Levi Aland, MD;  Location: [facility];  Service: Obstetrics;  Laterality: N/A;    Family History  Problem Relation Age of Onset   Hypertension  Father     Social History   Socioeconomic History   Marital status: Single    Spouse name: Lexicographer   Number of children: Not on file   Years of education: Not on file   Highest education level: Associate degree: academic program  Occupational History   Not on file  Tobacco Use   Smoking status: Never   Smokeless tobacco: Never  Vaping Use   Vaping status: Never Used  Substance and Sexual Activity   Alcohol use: No   Drug use: No   Sexual activity: Yes    Partners: Male    Birth control/protection: None  Other Topics Concern   Not on file  Social History Narrative   Not on  file   Social Drivers of Health   Financial Resource Strain: Low Risk  (09/02/2023)   Overall Financial Resource Strain (CARDIA)    Difficulty of Paying Living Expenses: Not hard at all  Food Insecurity: No Food Insecurity (09/02/2023)   Hunger Vital Sign    Worried About Running Out of Food in the Last Year: Never true    Ran Out of Food in the Last Year: Never true  Transportation Needs: No Transportation Needs (09/02/2023)   PRAPARE - Administrator, Civil Service (Medical): No    Lack of Transportation (Non-Medical): No  Physical Activity: Sufficiently Active (09/02/2023)   Exercise Vital Sign    Days of Exercise per Week: 5 days    Minutes of Exercise per Session: 40 min  Stress: No Stress Concern Present (09/02/2023)   [employer] of Occupational Health - Occupational Stress Questionnaire    Feeling of Stress : Not at all  Social Connections: Socially Integrated (09/02/2023)   Social Connection and Isolation Panel [NHANES]    Frequency of Communication with Friends and Family: More than three times a week    Frequency of Social Gatherings with Friends and Family: Three times a week    Attends Religious Services: More than 4 times per year    Active Member of Clubs or Organizations: Yes    Attends Banker Meetings: Never    Marital Status: Married  Catering manager Violence: Not At Risk (06/03/2023)   Humiliation, Afraid, Rape, and Kick questionnaire    Fear of Current or Ex-Partner: No    Emotionally Abused: No    Physically Abused: No    Sexually Abused: No    Review of Systems  All other systems reviewed and are negative.       Objective    BP (!) 155/96   Pulse (!) 57   Temp 97.9 F (36.6 C) (Oral)   Resp 16   Ht 5\' 3"  (1.6 m)   Wt 292 lb 6.4 oz (132.6 kg)   SpO2 96%   BMI 51.80 kg/m   Physical Exam Vitals and nursing note reviewed.  Constitutional:      General: She is not in acute distress.    Appearance: She is obese.   Cardiovascular:     Rate and Rhythm: Normal rate and regular rhythm.  Pulmonary:     Effort: Pulmonary effort is normal.     Breath sounds: Normal breath sounds.  Abdominal:     Palpations: Abdomen is soft.     Tenderness: There is no abdominal tenderness.  Neurological:     General: No focal deficit present.     Mental Status: She is alert and oriented to person, place, and time.  Assessment & Plan:  1. Essential hypertension (Primary) Elevated readings. Discussed compliance and home monitoring.   2. Class 3 severe obesity due to excess calories with serious comorbidity and body mass index (BMI) of 50.0 to 59.9 in adult [facility]) Discussed options. Patient would like to defer med agent until she discusses with consultant  3. Infertility counseling Referred for further eval/mgt    Return if symptoms worsen or fail to improve.   Tommie Raymond, MD

## 2023-09-09 ENCOUNTER — Encounter: Payer: Self-pay | Admitting: Emergency Medicine

## 2023-09-09 ENCOUNTER — Other Ambulatory Visit: Payer: Self-pay | Admitting: Emergency Medicine

## 2023-09-09 MED ORDER — NP THYROID 90 MG PO TABS: 90.0000 mg | ORAL_TABLET | Freq: Every morning | ORAL | 0 refills | Status: DC

## 2023-09-09 NOTE — Telephone Encounter (Signed)
 No answer when called.  Voicemail was full so I left a my chart message

## 2023-09-30 ENCOUNTER — Other Ambulatory Visit: Payer: Self-pay | Admitting: *Deleted

## 2023-09-30 MED ORDER — LABETALOL HCL 200 MG PO TABS: 200.0000 mg | ORAL_TABLET | Freq: Two times a day (BID) | ORAL | 0 refills | Status: DC

## 2023-09-30 NOTE — Telephone Encounter (Signed)
 I have attempted without success to contact this patient by phone to return their call. Patient MBF could not leave message.   Medication  has been sent in   levothyroxine (SYNTHROID) 125 MCG tablet

## 2023-11-19 ENCOUNTER — Telehealth: Payer: Self-pay | Admitting: *Deleted

## 2023-11-19 NOTE — Telephone Encounter (Signed)
 I have attempted to contact this patient by phone with the following results: left message to return my call on answering machine.

## 2023-12-25 ENCOUNTER — Encounter: Payer: Self-pay | Admitting: Family Medicine

## 2023-12-25 ENCOUNTER — Ambulatory Visit (INDEPENDENT_AMBULATORY_CARE_PROVIDER_SITE_OTHER): Admitting: Family Medicine

## 2023-12-25 VITALS — BP 145/84 | HR 61 | Ht 63.0 in | Wt 298.2 lb

## 2023-12-25 DIAGNOSIS — Z1329 Encounter for screening for other suspected endocrine disorder: Secondary | ICD-10-CM

## 2023-12-25 DIAGNOSIS — Z0000 Encounter for general adult medical examination without abnormal findings: Secondary | ICD-10-CM | POA: Diagnosis not present

## 2023-12-25 DIAGNOSIS — E66813 Obesity, class 3: Secondary | ICD-10-CM

## 2023-12-25 DIAGNOSIS — Z13220 Encounter for screening for lipoid disorders: Secondary | ICD-10-CM | POA: Diagnosis not present

## 2023-12-25 DIAGNOSIS — Z130 Encounter for screening for diseases of the blood and blood-forming organs and certain disorders involving the immune mechanism: Secondary | ICD-10-CM

## 2023-12-25 DIAGNOSIS — Z6841 Body Mass Index (BMI) 40.0 and over, adult: Secondary | ICD-10-CM

## 2023-12-25 DIAGNOSIS — Z13228 Encounter for screening for other metabolic disorders: Secondary | ICD-10-CM

## 2023-12-25 NOTE — Progress Notes (Signed)
 Established Patient Office Visit  Subjective    Patient ID: Marisa Campbell, female    DOB: [birth year]  Age: 43 y.o. MRN: [id number]  CC:  Chief Complaint  Patient presents with   Annual Exam    Patient states:   Was sick a couple week ago, and now loses her voice when talking and sing  TSH/A1C CHECK- High bs     Weight Management Screening   Dizziness    HPI Marisa Campbell presents for routine annual exam. Patient reports that she is concerned about her weight.  Outpatient Encounter Medications as of 12/25/2023  Medication Sig   aspirin  EC 81 MG tablet Take 81 mg by mouth daily. Swallow whole.   labetalol  (NORMODYNE ) 200 MG tablet Take 1 tablet (200 mg total) by mouth 2 (two) times daily.   levothyroxine  (SYNTHROID ) 125 MCG tablet Take 1 tablet (125 mcg total) by mouth daily before breakfast.   acetaminophen  (TYLENOL ) 160 MG chewable tablet Chew 160 mg by mouth every 6 (six) hours as needed for pain. (Patient not taking: Reported on 06/03/2023)   Blood Pressure Monitoring (BLOOD PRESSURE KIT) DEVI 1 kit by Does not apply route once a week. Check Blood Pressure regularly and record readings into the Babyscripts App.  Large Cuff.  DX O90.0 (Patient not taking: Reported on 07/25/2022)   estradiol  (ESTRACE  VAGINAL) 0.1 MG/GM vaginal cream Place 1 Applicatorful vaginally at bedtime. (Patient not taking: Reported on 12/19/2022)   ibuprofen  (ADVIL ) 600 MG tablet Take 1 tablet (600 mg total) by mouth every 6 (six) hours as needed. (Patient not taking: Reported on 06/03/2023)   imiquimod  (ALDARA ) 5 % cream Apply topically 3 (three) times a week. Apply until total clearance or maximum of 16 weeks   Iron  Combinations (NIFEREX ) TABS Take 1 tablet by mouth daily after breakfast.   misoprostol  (CYTOTEC ) 200 MCG tablet Take 1 tablet (200 mcg total) by mouth once for 1 dose.   misoprostol  (CYTOTEC ) 200 MCG tablet Take 1 tablet (200 mcg total) by mouth 3 (three) times daily after meals.  (Patient not taking: Reported on 06/03/2023)   NP THYROID  90 MG tablet Take 1 tablet (90 mg total) by mouth every morning.   ondansetron  (ZOFRAN ) 4 MG tablet Take 1 tablet (4 mg total) by mouth every 8 (eight) hours as needed for nausea or vomiting. (Patient not taking: Reported on 06/03/2023)   Prenatal Vit-Fe Fumarate-FA (PRENATAL VITAMIN PO) Take by mouth.   traMADol  (ULTRAM ) 50 MG tablet Take 1 tablet (50 mg total) by mouth every 6 (six) hours as needed. (Patient not taking: Reported on 06/03/2023)   valACYclovir  (VALTREX ) 1000 MG tablet TAKE 2 TABLETS BY MOUTH EVERY 12 HOURS FOR 1 DAY (Patient not taking: Reported on 12/19/2022)   No facility-administered encounter medications on file as of 12/25/2023.    Past Medical History:  Diagnosis Date   Anemia    COVID-19 01/31/2019   COVID-19 virus infection 02/01/2019   7/27, symptomatic   Gestational diabetes    Headache    History of cold sores 11/17/2020   HSV (herpes simplex virus) anogenital infection    oral    Hypothyroidism    on meds currently   Pregnancy induced hypertension    Vaginal Pap smear, abnormal     Past Surgical History:  Procedure Laterality Date   CESAREAN SECTION     CESAREAN SECTION N/A 06/01/2019   Procedure: CESAREAN SECTION;  Surgeon: Lenon Oneil BRAVO, MD;  Location: [facility] LD ORS;  Service:  Obstetrics;  Laterality: N/A;    Family History  Problem Relation Age of Onset   Hypertension Father     Social History   Socioeconomic History   Marital status: Single    Spouse name: [name]   Number of children: Not on file   Years of education: Not on file   Highest education level: Associate degree: academic program  Occupational History   Not on file  Tobacco Use   Smoking status: Never   Smokeless tobacco: Never  Vaping Use   Vaping status: Never Used  Substance and Sexual Activity   Alcohol use: No   Drug use: No   Sexual activity: Yes    Partners: Male    Birth control/protection:  None  Other Topics Concern   Not on file  Social History Narrative   Not on file   Social Drivers of Health   Financial Resource Strain: Low Risk  (09/02/2023)   Overall Financial Resource Strain (CARDIA)    Difficulty of Paying Living Expenses: Not hard at all  Food Insecurity: No Food Insecurity (09/02/2023)   Hunger Vital Sign    Worried About Running Out of Food in the Last Year: Never true    Ran Out of Food in the Last Year: Never true  Transportation Needs: No Transportation Needs (09/02/2023)   PRAPARE - Administrator, Civil Service (Medical): No    Lack of Transportation (Non-Medical): No  Physical Activity: Sufficiently Active (09/02/2023)   Exercise Vital Sign    Days of Exercise per Week: 5 days    Minutes of Exercise per Session: 40 min  Stress: No Stress Concern Present (09/02/2023)   [employer] of Occupational Health - Occupational Stress Questionnaire    Feeling of Stress : Not at all  Social Connections: Socially Integrated (09/02/2023)   Social Connection and Isolation Panel    Frequency of Communication with Friends and Family: More than three times a week    Frequency of Social Gatherings with Friends and Family: Three times a week    Attends Religious Services: More than 4 times per year    Active Member of Clubs or Organizations: Yes    Attends Banker Meetings: Never    Marital Status: Married  Catering manager Violence: Not At Risk (06/03/2023)   Humiliation, Afraid, Rape, and Kick questionnaire    Fear of Current or Ex-Partner: No    Emotionally Abused: No    Physically Abused: No    Sexually Abused: No    Review of Systems  All other systems reviewed and are negative.       Objective    BP (!) 145/84 (BP Location: Left Arm, Patient Position: Sitting, Cuff Size: Large)   Pulse 61   Ht 5' 3 (1.6 m)   Wt 298 lb 3.2 oz (135.3 kg)   LMP 12/15/2023   SpO2 96%   BMI 52.82 kg/m   Physical Exam Vitals and nursing  note reviewed.  Constitutional:      General: She is not in acute distress.    Appearance: She is obese.  HENT:     Head: Normocephalic and atraumatic.     Right Ear: Tympanic membrane, ear canal and external ear normal.     Left Ear: Tympanic membrane, ear canal and external ear normal.     Nose: Nose normal.     Mouth/Throat:     Mouth: Mucous membranes are moist.     Pharynx: Oropharynx is clear.  Eyes:     Conjunctiva/sclera: Conjunctivae normal.     Pupils: Pupils are equal, round, and reactive to light.   Neck:     Thyroid : No thyromegaly.   Cardiovascular:     Rate and Rhythm: Normal rate and regular rhythm.     Heart sounds: Normal heart sounds. No murmur heard. Pulmonary:     Effort: Pulmonary effort is normal. No respiratory distress.     Breath sounds: Normal breath sounds.  Abdominal:     General: There is no distension.     Palpations: Abdomen is soft. There is no mass.     Tenderness: There is no abdominal tenderness.   Musculoskeletal:        General: Normal range of motion.     Cervical back: Normal range of motion and neck supple.   Skin:    General: Skin is warm and dry.   Neurological:     General: No focal deficit present.     Mental Status: She is alert and oriented to person, place, and time.   Psychiatric:        Mood and Affect: Mood normal.        Behavior: Behavior normal.         Assessment & Plan:   Annual physical exam -     Comprehensive metabolic panel with GFR  Screening for deficiency anemia -     CBC with Differential/Platelet  Screening for lipid disorders -     Lipid panel  Screening for endocrine/metabolic/immunity disorders -     TSH + free T4 -     Hemoglobin A1c -     TSH     No follow-ups on file.   Tanda Raguel SQUIBB, MD

## 2023-12-26 ENCOUNTER — Encounter: Payer: Self-pay | Admitting: Family Medicine

## 2023-12-26 ENCOUNTER — Ambulatory Visit: Payer: Self-pay | Admitting: Family Medicine

## 2023-12-26 LAB — CBC WITH DIFFERENTIAL/PLATELET
Basophils Absolute: 0 10*3/uL (ref 0.0–0.2)
Basos: 0 %
EOS (ABSOLUTE): 0.3 10*3/uL (ref 0.0–0.4)
Eos: 3 %
Hematocrit: 38 % (ref 34.0–46.6)
Hemoglobin: 12.4 g/dL (ref 11.1–15.9)
Immature Grans (Abs): 0 10*3/uL (ref 0.0–0.1)
Immature Granulocytes: 0 %
Lymphocytes Absolute: 1.8 10*3/uL (ref 0.7–3.1)
Lymphs: 22 %
MCH: 28.5 pg (ref 26.6–33.0)
MCHC: 32.6 g/dL (ref 31.5–35.7)
MCV: 87 fL (ref 79–97)
Monocytes Absolute: 0.6 10*3/uL (ref 0.1–0.9)
Monocytes: 8 %
Neutrophils Absolute: 5.3 10*3/uL (ref 1.4–7.0)
Neutrophils: 67 %
Platelets: 222 10*3/uL (ref 150–450)
RBC: 4.35 x10E6/uL (ref 3.77–5.28)
RDW: 13.4 % (ref 11.7–15.4)
WBC: 8 10*3/uL (ref 3.4–10.8)

## 2023-12-26 LAB — COMPREHENSIVE METABOLIC PANEL WITH GFR
ALT: 20 IU/L (ref 0–32)
AST: 25 IU/L (ref 0–40)
Albumin: 4.3 g/dL (ref 3.9–4.9)
Alkaline Phosphatase: 73 IU/L (ref 44–121)
BUN/Creatinine Ratio: 27 — ABNORMAL HIGH (ref 9–23)
BUN: 18 mg/dL (ref 6–24)
Bilirubin Total: 0.2 mg/dL (ref 0.0–1.2)
CO2: 19 mmol/L — ABNORMAL LOW (ref 20–29)
Calcium: 9.3 mg/dL (ref 8.7–10.2)
Chloride: 103 mmol/L (ref 96–106)
Creatinine, Ser: 0.67 mg/dL (ref 0.57–1.00)
Globulin, Total: 2.8 g/dL (ref 1.5–4.5)
Glucose: 89 mg/dL (ref 70–99)
Potassium: 4 mmol/L (ref 3.5–5.2)
Sodium: 140 mmol/L (ref 134–144)
Total Protein: 7.1 g/dL (ref 6.0–8.5)
eGFR: 111 mL/min/{1.73_m2} (ref >59)

## 2023-12-26 LAB — TSH+FREE T4
Free T4: 0.96 ng/dL (ref 0.82–1.77)
TSH: 0.836 u[IU]/mL (ref 0.450–4.500)

## 2023-12-26 LAB — LIPID PANEL
Chol/HDL Ratio: 3.8 ratio (ref 0.0–4.4)
Cholesterol, Total: 151 mg/dL (ref 100–199)
HDL: 40 mg/dL (ref >39)
LDL Chol Calc (NIH): 88 mg/dL (ref 0–99)
Triglycerides: 131 mg/dL (ref 0–149)
VLDL Cholesterol Cal: 23 mg/dL (ref 5–40)

## 2023-12-26 LAB — HEMOGLOBIN A1C
Est. average glucose Bld gHb Est-mCnc: 103 mg/dL
Hgb A1c MFr Bld: 5.2 % (ref 4.8–5.6)

## 2023-12-31 ENCOUNTER — Other Ambulatory Visit: Payer: Self-pay | Admitting: Family Medicine

## 2024-01-26 ENCOUNTER — Ambulatory Visit: Payer: Self-pay

## 2024-01-26 NOTE — Telephone Encounter (Signed)
 Called pt to offer appointment this afternoon, no answer, LVM

## 2024-01-26 NOTE — Telephone Encounter (Signed)
 FYI Only or Action Required?: FYI only for provider.  Patient was last seen in primary care on 12/25/2023 by Tanda Bleacher, MD.  Called Nurse Triage reporting Otalgia.  Symptoms began yesterday.  Interventions attempted: Nothing.  Symptoms are: gradually worsening.  Triage Disposition: See Physician Within 24 Hours  Patient/caregiver understands and will follow disposition?: Yes     Copied from CRM [id number]. Topic: Clinical - Red Word Triage >> Jan 26, 2024  8:35 AM Vena H wrote: Red Word that prompted transfer to Nurse Triage: Pt states her ear drum may have busted last night, in excruciating pain. States the pain goes down her jaw line and she could not sleep on that side, woke up with her ear full of infection, draining and bleeding. Pain level of 6/7 as of right now Reason for Disposition  White, yellow, or green discharge (pus)  Answer Assessment - Initial Assessment Questions 1. LOCATION: Which ear is involved?     Right ear 2. ONSET: When did the ear pain start?      yesterday 3. SEVERITY: How bad is the pain?  (Scale 1-10; mild, moderate or severe)     No touching 6 or 7/10 - any touching ear area 9/10 4. URI SYMPTOMS: Do you have a runny nose or cough?     no 5. FEVER: Do you have a fever? If Yes, ask: What is your temperature, how was it measured, and when did it start?     no 6. CAUSE: Have you been swimming recently?, How often do you use Q-TIPS?, Have you had any recent air travel or scuba diving?     na 7. OTHER SYMPTOMS: Do you have any other symptoms? (e.g., decreased hearing, dizziness, headache, stiff neck, vomiting)     no 8. PREGNANCY: Is there any chance you are pregnant? When was your last menstrual period?     Na  This morning blood and white & yellow drainage filled right ear.  Protocols used: Rilla

## 2024-01-27 ENCOUNTER — Ambulatory Visit (INDEPENDENT_AMBULATORY_CARE_PROVIDER_SITE_OTHER): Payer: Self-pay | Admitting: Primary Care

## 2024-01-27 VITALS — BP 137/86 | HR 55 | Resp 16 | Ht 63.0 in | Wt 291.2 lb

## 2024-01-27 DIAGNOSIS — D229 Melanocytic nevi, unspecified: Secondary | ICD-10-CM

## 2024-01-27 DIAGNOSIS — H9201 Otalgia, right ear: Secondary | ICD-10-CM

## 2024-01-27 MED ORDER — CIPRO HC 0.2-1 % OT SUSP: 3.0000 [drp] | Freq: Two times a day (BID) | OTIC | 0 refills | Status: DC

## 2024-01-27 NOTE — Patient Instructions (Signed)
Earache, Adult An earache, or ear pain, can be caused by many things, including: An infection. Ear wax buildup. Ear pressure. Something in the ear that should not be there (foreign body). A sore throat. Tooth problems. Jaw problems. Treatment of the earache will depend on the cause. If the cause is not clear or cannot be known, you may need to watch your symptoms until your earache goes away or until a cause is found. Follow these instructions at home: Medicines Take or apply over-the-counter and prescription medicines only as told by your health care provider. If you were prescribed antibiotics, use them as told by your health care provider. Do not stop using the antibiotic even if you start to feel better. Do not put anything in your ear other than medicine that is prescribed by your health care provider. Managing pain     If directed, apply heat to the affected area as often as told by your health care provider. Use the heat source that your health care provider recommends, such as a moist heat pack or a heating pad. Place a towel between your skin and the heat source. Leave the heat on for 20-30 minutes. If your skin turns bright red, remove the heat right away to prevent burns. The risk of burns is higher if you cannot feel pain, heat, or cold. If directed, put ice on the affected area. To do this: Put ice in a plastic bag. Place a towel between your skin and the bag. Leave the ice on for 20 minutes, 2-3 times a day. If your skin turns bright red, remove the ice right away to prevent skin damage. The risk of skin damage is higher if you cannot feel pain, heat, or cold.  General instructions Pay attention to any changes in your symptoms. Try resting in an upright position instead of lying down. This may help to reduce pressure in your ear and relieve pain. Chew gum if it helps to relieve your ear pain. Treat any allergies as told by your health care provider. Drink enough fluid  to keep your urine pale yellow. It is up to you to get the results of any tests that were done. Ask your health care provider, or the department that is doing the tests, when your results will be ready. Contact a health care provider if: Your pain does not improve within 2 days. Your earache gets worse. You have new symptoms. You have a fever. Get help right away if: You have a severe headache. You have a stiff neck. You have trouble swallowing. You have redness or swelling behind your ear. You have fluid or blood coming from your ear. You have hearing loss. You feel dizzy. This information is not intended to replace advice given to you by your health care provider. Make sure you discuss any questions you have with your health care provider. Document Revised: 11/05/2021 Document Reviewed: 11/05/2021 Elsevier Patient Education  2024 Elsevier Inc.  

## 2024-01-27 NOTE — Progress Notes (Signed)
 [facility]  Marisa Campbell, is a 43 y.o. female  RDW:[id number]  FMW:[id number]  DOB - 10-Nov-1980  Chief Complaint  Patient presents with   Otalgia    Right ear  Pt states ear drum may have busted last night, in excruciating pain. States the pain goes down her jaw line and she could not sleep on that side, woke up with her ear full of infection, draining and bleeding. Pain level of 6/7 as of right now Pt states yesterday morning blood and white & yellow drainage filled right ear.       Subjective:   Marisa Campbell is a 43 y.o. female here today for an acute visit.  Otalgia  There is pain in the right ear. This is a new problem. The current episode started in the past 7 days. The problem occurs constantly. The problem has been waxing and waning. Maximum temperature: felt cold, sweating day before. The fever has been present for 1 to 2 days. The pain is severe. Associated symptoms include ear discharge and headaches. She has tried acetaminophen  for the symptoms. The treatment provided no relief.    No problems updated.  Comprehensive ROS Pertinent positive and negative noted in HPI   No Known Allergies  Past Medical History:  Diagnosis Date   Anemia    COVID-19 01/31/2019   COVID-19 virus infection 02/01/2019   7/27, symptomatic   Gestational diabetes    Headache    History of cold sores 11/17/2020   HSV (herpes simplex virus) anogenital infection    oral    Hypothyroidism    on meds currently   Pregnancy induced hypertension    Vaginal Pap smear, abnormal     Current Outpatient Medications on File Prior to Visit  Medication Sig Dispense Refill   acetaminophen  (TYLENOL ) 160 MG chewable tablet Chew 160 mg by mouth every 6 (six) hours as needed for pain. (Patient not taking: Reported on 06/03/2023)     aspirin  EC 81 MG tablet Take 81 mg by mouth daily. Swallow whole.     Blood Pressure Monitoring (BLOOD PRESSURE KIT) DEVI 1 kit by Does not apply  route once a week. Check Blood Pressure regularly and record readings into the Babyscripts App.  Large Cuff.  DX O90.0 (Patient not taking: Reported on 07/25/2022) 1 each 0   estradiol  (ESTRACE  VAGINAL) 0.1 MG/GM vaginal cream Place 1 Applicatorful vaginally at bedtime. (Patient not taking: Reported on 12/19/2022) 42.5 g 0   ibuprofen  (ADVIL ) 600 MG tablet Take 1 tablet (600 mg total) by mouth every 6 (six) hours as needed. (Patient not taking: Reported on 06/03/2023) 30 tablet 1   imiquimod  (ALDARA ) 5 % cream Apply topically 3 (three) times a week. Apply until total clearance or maximum of 16 weeks 24 each 5   Iron  Combinations (NIFEREX ) TABS Take 1 tablet by mouth daily after breakfast. 30 tablet 2   labetalol  (NORMODYNE ) 200 MG tablet Take 1 tablet by mouth twice daily 180 tablet 0   levothyroxine  (SYNTHROID ) 125 MCG tablet Take 1 tablet (125 mcg total) by mouth daily before breakfast. 90 tablet 0   misoprostol  (CYTOTEC ) 200 MCG tablet Take 1 tablet (200 mcg total) by mouth once for 1 dose. 800 tablet 1   misoprostol  (CYTOTEC ) 200 MCG tablet Take 1 tablet (200 mcg total) by mouth 3 (three) times daily after meals. (Patient not taking: Reported on 06/03/2023) 6 tablet 0   NP THYROID  90 MG tablet Take 1 tablet (90 mg total) by mouth every  morning. 90 tablet 0   ondansetron  (ZOFRAN ) 4 MG tablet Take 1 tablet (4 mg total) by mouth every 8 (eight) hours as needed for nausea or vomiting. (Patient not taking: Reported on 06/03/2023) 20 tablet 0   Prenatal Vit-Fe Fumarate-FA (PRENATAL VITAMIN PO) Take by mouth.     traMADol  (ULTRAM ) 50 MG tablet Take 1 tablet (50 mg total) by mouth every 6 (six) hours as needed. (Patient not taking: Reported on 06/03/2023) 6 tablet 0   valACYclovir  (VALTREX ) 1000 MG tablet TAKE 2 TABLETS BY MOUTH EVERY 12 HOURS FOR 1 DAY (Patient not taking: Reported on 12/19/2022)     No current facility-administered medications on file prior to visit.   Health Maintenance  Topic Date Due    Hepatitis B Vaccine (1 of 3 - 19+ 3-dose series) Never done   HPV Vaccine (1 - Risk 3-dose SCDM series) Never done   Mammogram  07/09/2024   Pap with HPV screening  04/12/2027   DTaP/Tdap/Td vaccine (2 - Td or Tdap) 10/28/2030   Hepatitis C Screening  Completed   HIV Screening  Completed   Meningitis B Vaccine  Aged Out   Flu Shot  Discontinued   COVID-19 Vaccine  Discontinued    Objective:   Vitals:   01/27/24 0834  BP: 137/86  Pulse: (!) 55  Resp: 16  SpO2: 99%  Weight: 291 lb 3.2 oz (132.1 kg)  Height: 5' 3 (1.6 m)    Physical Exam Vitals reviewed.  Constitutional:      Appearance: She is obese.  HENT:     Head: Normocephalic.     Ears:     Comments: Tympanic membrane bulging with white spots and discharge    Nose: Nose normal.  Eyes:     Extraocular Movements: Extraocular movements intact.     Pupils: Pupils are equal, round, and reactive to light.  Cardiovascular:     Rate and Rhythm: Normal rate and regular rhythm.  Pulmonary:     Effort: Pulmonary effort is normal.     Breath sounds: Normal breath sounds.  Abdominal:     General: Abdomen is flat.     Palpations: Abdomen is soft.  Musculoskeletal:        General: Normal range of motion.     Cervical back: Normal range of motion and neck supple.  Skin:    General: Skin is warm and dry.  Neurological:     Mental Status: She is oriented to person, place, and time.  Psychiatric:        Mood and Affect: Mood normal.        Behavior: Behavior normal.   Kylen was seen today for otalgia.  Diagnoses and all orders for this visit:  Otalgia of right ear -     ciprofloxacin -hydrocortisone (CIPRO  HC) OTIC suspension; Place 3 drops into the right ear 2 (two) times daily.  Numerous moles     -     Ambulatory referral to Dermatology    Patient have been counseled extensively about nutrition and exercise. Other issues discussed during this visit include: low cholesterol diet, weight control and daily  exercise, foot care, annual eye examinations at Ophthalmology, importance of adherence with medications and regular follow-up. We also discussed long term complications of uncontrolled diabetes and hypertension.   Return for weight management .  The patient was given clear instructions to go to ER or return to medical center if symptoms don't improve, worsen or new problems develop. The patient verbalized understanding. The  patient was told to call to get lab results if they haven't heard anything in the next week.   This note has been created with Education officer, environmental. Any transcriptional errors are unintentional.   Marisa SHAUNNA Bohr, NP 02/01/2024, 11:53 PM

## 2024-02-25 ENCOUNTER — Other Ambulatory Visit: Payer: Self-pay | Admitting: Family Medicine

## 2024-03-22 ENCOUNTER — Other Ambulatory Visit: Payer: Self-pay | Admitting: Medical Genetics

## 2024-03-22 ENCOUNTER — Ambulatory Visit (INDEPENDENT_AMBULATORY_CARE_PROVIDER_SITE_OTHER): Admitting: Advanced Practice Midwife

## 2024-03-22 ENCOUNTER — Encounter: Payer: Self-pay | Admitting: Advanced Practice Midwife

## 2024-03-22 VITALS — BP 140/86 | HR 53 | Ht 63.0 in | Wt 294.0 lb

## 2024-03-22 DIAGNOSIS — N979 Female infertility, unspecified: Secondary | ICD-10-CM | POA: Diagnosis not present

## 2024-03-22 NOTE — Progress Notes (Signed)
 Menses started Fri spotting each day. Concerned is in perimenopause. PCP referred her here. Told her she is in perimenopause or has other hormonal issues. Taking regular vitamins now. Is wanting a pregnancy. Has been trying for two years. Miscarried last year at 8-10 wks.

## 2024-03-24 ENCOUNTER — Encounter: Payer: Self-pay | Admitting: Advanced Practice Midwife

## 2024-03-26 ENCOUNTER — Other Ambulatory Visit: Payer: Self-pay | Admitting: Family Medicine

## 2024-03-29 ENCOUNTER — Ambulatory Visit (INDEPENDENT_AMBULATORY_CARE_PROVIDER_SITE_OTHER): Admitting: Primary Care

## 2024-03-30 DIAGNOSIS — N979 Female infertility, unspecified: Secondary | ICD-10-CM | POA: Insufficient documentation

## 2024-03-30 NOTE — Progress Notes (Signed)
   GYNECOLOGY PROGRESS NOTE  History:  43 y.o. [id number] presents to Marisa Campbell office today for problem gyn visit. She reports desire for pregnancy.  She denies h/a, dizziness, shortness of breath, n/v, or fever/chills.    The following portions of the patient's history were reviewed and updated as appropriate: allergies, current medications, past family history, past medical history, past social history, past surgical history and problem list. Last pap smear on 04/11/22 was normal, neg HRHPV.  Health Maintenance Due  Topic Date Due   Hepatitis B Vaccines 19-59 Average Risk (1 of 3 - 19+ 3-dose series) Never done   HPV VACCINES (1 - Risk 3-dose SCDM series) Never done     Review of Systems:  Pertinent items are noted in HPI.   Objective:  Physical Exam Blood pressure (!) 140/86, pulse (!) 53, height 5' 3 (1.6 m), weight 294 lb (133.4 kg), last menstrual period 03/19/2024. VS reviewed, nursing note reviewed,  Constitutional: well developed, well nourished, no distress HEENT: normocephalic CV: normal rate Pulm/chest wall: normal effort Breast Exam: deferred Abdomen: soft Neuro: alert and oriented x 3 Skin: warm, dry Psych: affect normal Pelvic exam: Cervix pink, visually closed, without lesion, scant white creamy discharge, vaginal walls and external genitalia normal Bimanual exam: Cervix 0/long/high, firm, anterior, neg CMT, uterus nontender, nonenlarged, adnexa without tenderness, enlargement, or mass  Assessment & Plan:  1. Infertility, female (Primary)  - Anti mullerian hormone - HgB A1c - Estradiol  - FSH - LH - Testosterone , Free, Total, SHBG - TSH - Beta hCG quant (ref lab) - Prolactin - Ambulatory referral to Endocrinology (Dr Carroll)   Return in about 2 months (around 05/22/2024) for MD only, female doctor.   Olam Boards, CNM 9:52 PM

## 2024-03-31 LAB — HEMOGLOBIN A1C
Est. average glucose Bld gHb Est-mCnc: 108 mg/dL
Hgb A1c MFr Bld: 5.4 % (ref 4.8–5.6)

## 2024-03-31 LAB — TESTOSTERONE, FREE, TOTAL, SHBG
Sex Hormone Binding: 39.6 nmol/L (ref 24.6–122.0)
Testosterone, Free: 2.2 pg/mL (ref 0.0–4.2)
Testosterone: 28 ng/dL (ref 4–50)

## 2024-03-31 LAB — BETA HCG QUANT (REF LAB): hCG Quant: <1 m[IU]/mL

## 2024-03-31 LAB — TSH: TSH: 0.942 u[IU]/mL (ref 0.450–4.500)

## 2024-03-31 LAB — LUTEINIZING HORMONE: LH: 5.7 m[IU]/mL

## 2024-03-31 LAB — FOLLICLE STIMULATING HORMONE: FSH: 12.8 m[IU]/mL

## 2024-03-31 LAB — PROLACTIN: Prolactin: 7.7 ng/mL (ref 4.8–33.4)

## 2024-03-31 LAB — ESTRADIOL: Estradiol: 25.3 pg/mL

## 2024-03-31 LAB — ANTI MULLERIAN HORMONE: ANTI-MULLERIAN HORMONE (AMH): 0.227 ng/mL

## 2024-04-05 ENCOUNTER — Ambulatory Visit: Payer: Self-pay | Admitting: Advanced Practice Midwife

## 2024-04-07 ENCOUNTER — Ambulatory Visit: Admitting: Physician Assistant

## 2024-04-07 ENCOUNTER — Encounter: Payer: Self-pay | Admitting: Physician Assistant

## 2024-04-07 VITALS — BP 140/78 | HR 65 | Temp 96.8°F | Wt 296.0 lb

## 2024-04-07 DIAGNOSIS — H6693 Otitis media, unspecified, bilateral: Secondary | ICD-10-CM

## 2024-04-07 DIAGNOSIS — R030 Elevated blood-pressure reading, without diagnosis of hypertension: Secondary | ICD-10-CM

## 2024-04-07 MED ORDER — AMOXICILLIN-POT CLAVULANATE 875-125 MG PO TABS: 1.0000 | ORAL_TABLET | Freq: Two times a day (BID) | ORAL | 0 refills | Status: AC

## 2024-04-07 NOTE — Progress Notes (Unsigned)
 Established Patient Office Visit  Subjective   Patient ID: Marisa Campbell, female    DOB: 04-24-1981  Age: 43 y.o. MRN: [id number]  Chief Complaint  Patient presents with   Ear Pain   Sore Throat  Discussed the use of AI scribe software for clinical note transcription with the patient, who gave verbal consent to proceed.  History of Present Illness   Marisa Campbell is a 43 year old female who presents with bilateral ear pain and discharge.  She has experienced bilateral ear pain for nearly a week, starting last Thursday. Cipro  ear drops, previously used for an ear infection, alleviated pain in the right ear but not the left. A swollen lymph node is noted behind the left ear. Pain is intense, radiates around the ear, and worsens with sneezing, causing a sensation of ear explosion.  Over the weekend, she felt feverish with a sore throat, which has since improved. Initially, sinus pain and lightheadedness were present but have subsided as the right ear pain decreased. A significant amount of pus-like discharge is noted from the left ear. No recent swimming or cold symptoms are reported.  She uses Tylenol  for pain relief but is cautious about overuse. Alcohol in the ear was tried as a home remedy without success. In the past, she experienced ear bleeding associated with a suspected ruptured eardrum. She denies frequent ear infections prior to this year.     Past Medical History:  Diagnosis Date   Anemia    COVID-19 01/31/2019   COVID-19 virus infection 02/01/2019   7/27, symptomatic   Gestational diabetes    Headache    History of cold sores 11/17/2020   HSV (herpes simplex virus) anogenital infection    oral    Hypothyroidism    on meds currently   Pregnancy induced hypertension    Vaginal Pap smear, abnormal    Social History   Socioeconomic History   Marital status: Single    Spouse name: Lexicographer   Number of children: Not on file   Years of  education: Not on file   Highest education level: Associate degree: academic program  Occupational History   Not on file  Tobacco Use   Smoking status: Never   Smokeless tobacco: Never  Vaping Use   Vaping status: Never Used  Substance and Sexual Activity   Alcohol use: No   Drug use: No   Sexual activity: Yes    Partners: Male    Birth control/protection: None  Other Topics Concern   Not on file  Social History Narrative   Not on file   Social Drivers of Health   Financial Resource Strain: Low Risk  (01/27/2024)   Overall Financial Resource Strain (CARDIA)    Difficulty of Paying Living Expenses: Not hard at all  Food Insecurity: No Food Insecurity (01/27/2024)   Hunger Vital Sign    Worried About Running Out of Food in the Last Year: Never true    Ran Out of Food in the Last Year: Never true  Transportation Needs: No Transportation Needs (01/27/2024)   PRAPARE - Administrator, Civil Service (Medical): No    Lack of Transportation (Non-Medical): No  Physical Activity: Sufficiently Active (01/27/2024)   Exercise Vital Sign    Days of Exercise per Week: 5 days    Minutes of Exercise per Session: 30 min  Stress: No Stress Concern Present (01/27/2024)   [employer] of Occupational Health - Occupational Stress Questionnaire  Feeling of Stress: Not at all  Social Connections: Socially Integrated (01/27/2024)   Social Connection and Isolation Panel    Frequency of Communication with Friends and Family: More than three times a week    Frequency of Social Gatherings with Friends and Family: Twice a week    Attends Religious Services: More than 4 times per year    Active Member of [employer] or Organizations: Yes    Attends Banker Meetings: 1 to 4 times per year    Marital Status: Married  Catering manager Violence: Not At Risk (06/03/2023)   Humiliation, Afraid, Rape, and Kick questionnaire    Fear of Current or Ex-Partner: No    Emotionally Abused:  No    Physically Abused: No    Sexually Abused: No   Family History  Problem Relation Age of Onset   Hypertension Father    No Known Allergies  Review of Systems  Constitutional:  Negative for chills and fever.  HENT:  Positive for ear discharge and ear pain. Negative for congestion, nosebleeds, sinus pain and sore throat.   Eyes: Negative.   Respiratory:  Negative for shortness of breath.   Cardiovascular:  Negative for chest pain.  Gastrointestinal:  Negative for nausea and vomiting.  Genitourinary: Negative.   Musculoskeletal: Negative.   Skin: Negative.   Neurological: Negative.   Endo/Heme/Allergies: Negative.   Psychiatric/Behavioral: Negative.        Objective:     BP (!) 140/78 (BP Location: Left Arm, Patient Position: Sitting)   Pulse 65   Temp (!) 96.8 F (36 C)   Wt 296 lb (134.3 kg)   LMP 03/19/2024 (Exact Date)   SpO2 95%   BMI 52.43 kg/m  BP Readings from Last 3 Encounters:  04/07/24 (!) 140/78  03/22/24 (!) 140/86  01/27/24 137/86   Wt Readings from Last 3 Encounters:  04/07/24 296 lb (134.3 kg)  03/22/24 294 lb (133.4 kg)  01/27/24 291 lb 3.2 oz (132.1 kg)    Physical Exam Vitals and nursing note reviewed.    GENERAL: Alert, cooperative, well developed, no acute distress. HEENT: Normocephalic, normal oropharynx, moist mucous membranes. Right ear with redness, bulging tympanic membrane, and white clear liquid. Left ear with redness on the outer edge. CHEST: Clear to auscultation bilaterally. No wheezes, rhonchi, or crackles. CARDIOVASCULAR: Normal heart rate and rhythm, S1 and S2 normal without murmurs.  EXTREMITIES: No cyanosis or edema. NEUROLOGICAL: Cranial nerves grossly intact. Moves all extremities without gross motor or sensory deficit.   Assessment & Plan:   Problem List Items Addressed This Visit       Other   Elevated blood-pressure reading without diagnosis of hypertension   Other Visit Diagnoses       Acute otitis  media, bilateral    -  Primary   Relevant Medications   amoxicillin-clavulanate (AUGMENTIN) 875-125 MG tablet     Assessment and Plan  Acute otitis media, bilateral  - Prescribed Augmentin twice daily for 7 days. - Advised Tylenol  for pain management, cautioning against overuse. - Recommended heating pad for pain relief. - Instructed to follow up if symptoms persist or recur, consider ENT referral. The patient was given clear instructions to go to ER or return to medical center if symptoms don't improve, worsen or new problems develop. The patient verbalized understanding.    I have reviewed the patient's medical history (PMH, PSH, Social History, Family History, Medications, and allergies) , and have been updated if relevant. I spent 30 minutes reviewing  chart and  face to face time with patient.     Return in about 1 week (around 04/14/2024) for With [facility].    Kirk RAMAN Mayers, PA-C

## 2024-04-07 NOTE — Patient Instructions (Signed)
 VISIT SUMMARY:  You came in today because of ear pain and discharge in both ears. You have been experiencing this pain for about a week, and while the pain in your right ear has improved, the pain in your left ear has not. You also had a fever and sore throat over the weekend, which have since improved. You noticed a significant amount of pus-like discharge from your left ear and have been using Tylenol  for pain relief.  YOUR PLAN:  -ACUTE OTITIS MEDIA, BILATERAL: Acute otitis media is an infection of the middle ear, which can cause pain, swelling, and discharge. You have been prescribed Augmentin to take twice daily for 7 days to treat the infection.  A heating pad can also help relieve pain. If your symptoms persist or come back, please follow up, and we may consider referring you to an ear, nose, and throat specialist.  Otitis Media, Adult  Otitis media occurs when there is inflammation and fluid in the middle ear with signs and symptoms of an acute infection. The middle ear is a part of the ear that contains bones for hearing as well as air that helps send sounds to the brain. When infected fluid builds up in this space, it causes pressure and can lead to an ear infection. The eustachian tube connects the middle ear to the back of the nose (nasopharynx) and normally allows air into the middle ear. If the eustachian tube becomes blocked, fluid can build up and become infected. What are the causes? This condition is caused by a blockage in the eustachian tube. This can be caused by mucus or by swelling of the tube. Problems that can cause a blockage include: A cold or other upper respiratory infection. Allergies. An irritant, such as tobacco smoke. Enlarged adenoids. The adenoids are areas of soft tissue located high in the back of the throat, behind the nose and the roof of the mouth. They are part of the body's defense system (immune system). A mass in the nasopharynx. Damage to the ear caused  by pressure changes (barotrauma). What increases the risk? You are more likely to develop this condition if you: Smoke or are exposed to tobacco smoke. Have an opening in the roof of your mouth (cleft palate). Have gastroesophageal reflux. Have an immune system disorder. What are the signs or symptoms? Symptoms of this condition include: Ear pain. Fever. Decreased hearing. Tiredness (lethargy). Fluid leaking from the ear, if the eardrum is ruptured or has burst. Ringing in the ear. How is this diagnosed?  This condition is diagnosed with a physical exam. During the exam, your health care provider will use an instrument called an otoscope to look in your ear and check for redness, swelling, and fluid. He or she will also ask about your symptoms. Your health care provider may also order tests, such as: A pneumatic otoscopy. This is a test to check the movement of the eardrum. It is done by squeezing a small amount of air into the ear. A tympanogram. This is a test that shows how well the eardrum moves in response to air pressure in the ear canal. It provides a graph for your health care provider to review. How is this treated? This condition can go away on its own within 3-5 days. But if the condition is caused by a bacterial infection and does not go away on its own, or if it keeps coming back, your health care provider may: Prescribe antibiotic medicine to treat the infection. Prescribe  or recommend medicines to control pain. Follow these instructions at home: Take over-the-counter and prescription medicines only as told by your health care provider. If you were prescribed an antibiotic medicine, take it as told by your health care provider. Do not stop taking the antibiotic even if you start to feel better. Keep all follow-up visits. This is important. Contact a health care provider if: You have bleeding from your nose. There is a lump on your neck. You are not feeling better in 5  days. You feel worse instead of better. Get help right away if: You have severe pain that is not controlled with medicine. You have swelling, redness, or pain around your ear. You have stiffness in your neck. A part of your face is not moving (paralyzed). The bone behind your ear (mastoid bone) is tender when you touch it. You develop a severe headache. Summary Otitis media is redness, soreness, and swelling of the middle ear, usually resulting in pain and decreased hearing. This condition can go away on its own within 3-5 days. If the problem does not go away in 3-5 days, your health care provider may give you medicines to treat the infection. If you were prescribed an antibiotic medicine, take it as told by your health care provider. Follow all instructions that were given to you by your health care provider. This information is not intended to replace advice given to you by your health care provider. Make sure you discuss any questions you have with your health care provider. Document Revised: 10/02/2020 Document Reviewed: 10/02/2020 Elsevier Patient Education  2024 [employer].

## 2024-04-22 ENCOUNTER — Other Ambulatory Visit: Payer: Self-pay | Admitting: Family Medicine

## 2024-05-24 ENCOUNTER — Ambulatory Visit (INDEPENDENT_AMBULATORY_CARE_PROVIDER_SITE_OTHER): Admitting: Obstetrics and Gynecology

## 2024-05-24 ENCOUNTER — Encounter: Payer: Self-pay | Admitting: Obstetrics and Gynecology

## 2024-05-24 VITALS — BP 156/80 | HR 63 | Ht 63.0 in | Wt 306.0 lb

## 2024-05-24 DIAGNOSIS — F439 Reaction to severe stress, unspecified: Secondary | ICD-10-CM

## 2024-05-24 DIAGNOSIS — Z6841 Body Mass Index (BMI) 40.0 and over, adult: Secondary | ICD-10-CM

## 2024-05-24 DIAGNOSIS — E66813 Obesity, class 3: Secondary | ICD-10-CM | POA: Diagnosis not present

## 2024-05-24 DIAGNOSIS — R432 Parageusia: Secondary | ICD-10-CM | POA: Diagnosis not present

## 2024-05-24 DIAGNOSIS — R61 Generalized hyperhidrosis: Secondary | ICD-10-CM | POA: Diagnosis not present

## 2024-05-24 NOTE — Progress Notes (Signed)
 GYNECOLOGY VISIT  Patient name: Marisa Campbell MRN [id number]  Date of birth: 03-31-1981 Chief Complaint:   Follow-up  History:  Discussed the use of AI scribe software for clinical note transcription with the patient, who gave verbal consent to proceed.  History of Present Illness Marisa Campbell is a 43 year old female with a history of thyroid  problems who presents with concerns about fertility and weight management.  She has been experiencing difficulties with fertility and weight management since the birth of her last daughter, who is now three and a [age] years old. She feels her hormones have been 'out of whack' since then. After giving birth, she experienced severe coldness and sweating, requiring multiple blankets to stay warm. Since then, she has noticed changes in her hair and skin, describing them as very dry.  She has been actively trying to conceive and mentions a previous pregnancy loss last year. Despite having regular menstrual cycles, she is concerned about being in premenopause. She has been going to the gym consistently for about a year, five times a week, but has recently gained ten pounds despite her efforts. She attributes some of her weight issues to stress and reports that she is on medication for blood pressure.  She has a history of thyroid  problems, which she believes may contribute to her symptoms of heat intolerance and occasional fevers at night. She experiences 'hot moments' primarily in her face, which can last from a few minutes to an hour. She has been taking a vitamin supplement designed for older women, which she feels helps manage her symptoms.  She is under significant stress, partly due to personal issues involving a person from her church, which has affected her sleep and emotional well-being. She was crying before the visit and reports being under a lot of stress, which she believes impacts her weight and overall health.   The following  portions of the patient's history were reviewed and updated as appropriate: allergies, current medications, past family history, past medical history, past social history, past surgical history and problem list.   Health Maintenance:   Last pap     Component Value Date/Time   DIAGPAP  04/11/2022 0906    - Negative for intraepithelial lesion or malignancy (NILM)   HPVHIGH Negative 04/11/2022 0906   ADEQPAP  04/11/2022 0906    Satisfactory for evaluation; transformation zone component PRESENT.    Health Maintenance  Topic Date Due   Hepatitis B Vaccine (1 of 3 - 19+ 3-dose series) Never done   HPV Vaccine (1 - Risk 3-dose SCDM series) Never done   Breast Cancer Screening  07/09/2024   Pap with HPV screening  04/12/2027   DTaP/Tdap/Td vaccine (2 - Td or Tdap) 10/28/2030   Hepatitis C Screening  Completed   HIV Screening  Completed   Meningitis B Vaccine  Aged Out   Pneumococcal Vaccine  Discontinued   Flu Shot  Discontinued   COVID-19 Vaccine  Discontinued      Review of Systems:  Pertinent items are noted in HPI. Comprehensive review of systems was otherwise negative.   Objective:  Physical Exam BP (!) 156/80 (BP Location: Right Arm, Cuff Size: Large)   Pulse 63   Ht 5' 3 (1.6 m)   Wt (!) 306 lb (138.8 kg)   LMP 05/08/2024 (Exact Date)   BMI 54.21 kg/m    Physical Exam Vitals and nursing note reviewed.  Constitutional:      Appearance: Normal appearance.  HENT:  Head: Normocephalic and atraumatic.  Pulmonary:     Effort: Pulmonary effort is normal.  Skin:    General: Skin is warm and dry.  Neurological:     General: No focal deficit present.  Psychiatric:        Mood and Affect: Mood normal.        Behavior: Behavior normal.        Thought Content: Thought content normal.        Judgment: Judgment normal.      Labs and Imaging PRL 7.7 TSH 0.942 Free Testosterone  28 LH 5.7 FSH 12.8 Estradiol  25.3 A1c 5.4     Assessment & Plan:  Assessment  and Plan Assessment & Plan Obesity and weight management Recent weight gain despite exercise, stress may contribute. Interested in weight loss options, including medication and dietary changes. Open to weight loss clinic referral. Husband not supportive of bariatric surgery. - Referred to a weight loss clinic for comprehensive weight management, including dietary recommendations and potential medication options.  Hypertension Blood pressure elevated despite medication. Stress may contribute. - Continue current antihypertensive medication. - Address stress management as part of overall health improvement.  Perimenopausal symptoms Experiencing symptoms consistent with perimenopause. Regular menstrual periods indicate ongoing reproductive potential. Symptoms may be exacerbated by stress and weight gain. AMH levels low but consistent with age. - Consider hormone therapy for hot flashes if symptoms become bothersome. - Discussed lifestyle modifications to manage symptoms, including stress reduction and weight management.  Female infertility Actively trying to conceive, experienced miscarriage last year. Low AMH levels indicate reduced ovarian reserve but consistent with age. Focusing on overall health and wellness rather than aggressive fertility treatments. - Focus on overall health and wellness, including stress management and weight control.  Hypothyroidism Long-standing hypothyroidism with symptoms of cold intolerance and dry skin. Symptoms may overlap with perimenopausal symptoms.  Psychosocial stressors Significant stress related to personal and spiritual issues impacting health, including blood pressure and weight management. - Encouraged discussion of stressors with a trusted individual in her church or a behavioral health professional.  General Health Maintenance Discussed importance of regular exercise and dietary management. Interested in vitamin supplementation for hormonal  balance. - Continue regular exercise regimen. - Consider vitamin supplementation as needed for hormonal balance.    Carter Quarry, MD [facility]

## 2024-05-24 NOTE — Progress Notes (Signed)
 43 y.o. GYN presents for Follow up/lab results. Pt aslo wants to discuss weight loss, she exercises daily, changed her diet.  Last PAP 04/11/2022  NILM Last Mammogram  07/10/2023 Negative

## 2024-05-26 ENCOUNTER — Encounter (INDEPENDENT_AMBULATORY_CARE_PROVIDER_SITE_OTHER): Payer: Self-pay

## 2024-06-01 ENCOUNTER — Encounter: Payer: Self-pay | Admitting: Physician Assistant

## 2024-06-01 ENCOUNTER — Ambulatory Visit: Admitting: Physician Assistant

## 2024-06-01 VITALS — BP 176/90 | HR 58

## 2024-06-01 DIAGNOSIS — I10 Essential (primary) hypertension: Secondary | ICD-10-CM

## 2024-06-01 DIAGNOSIS — H65194 Other acute nonsuppurative otitis media, recurrent, right ear: Secondary | ICD-10-CM

## 2024-06-01 DIAGNOSIS — E039 Hypothyroidism, unspecified: Secondary | ICD-10-CM

## 2024-06-01 MED ORDER — CETIRIZINE HCL 10 MG PO TABS: 10.0000 mg | ORAL_TABLET | Freq: Every day | ORAL | 11 refills | Status: AC

## 2024-06-01 MED ORDER — AMOXICILLIN-POT CLAVULANATE 875-125 MG PO TABS: 1.0000 | ORAL_TABLET | Freq: Two times a day (BID) | ORAL | 0 refills | Status: AC

## 2024-06-01 MED ORDER — NP THYROID 90 MG PO TABS: 90.0000 mg | ORAL_TABLET | Freq: Every morning | ORAL | 0 refills | Status: DC

## 2024-06-01 NOTE — Progress Notes (Signed)
 Established Patient Office Visit  Subjective   Patient ID: Marisa Campbell, female    DOB: 23-Feb-1981  Age: 43 y.o. MRN: [id number]  Chief Complaint  Patient presents with   Otitis Media   Discussed the use of AI scribe software for clinical note transcription with the patient, who gave verbal consent to proceed.  History of Present Illness   Marisa Campbell is a 43 year old female who presents with ear pain and congestion.  Her illness began on November 14th with sore throat and mild congestion, progressing to hoarseness that improved after a few days, while congestion and drainage persisted.  Her left ear pain was brief and resolved, then right ear pain started the following Friday and became severe and excruciating by Tuesday, preventing sleep. Tylenol  and DayQuil have not controlled the pain. She had a similar ear infection in October affecting both ears that improved after a few days of Augmentin , and she feels this episode is more severe.  Sleeping on her right side worsens symptoms. Lying on her left side increases drainage and congestion and makes it hard to breathe through her nose. She uses allergy medication only when symptoms are severe and suspects seasonal allergies as a trigger. She denies medication allergies and takes blood pressure and thyroid  medications.     Past Medical History:  Diagnosis Date   Anemia    COVID-19 01/31/2019   COVID-19 virus infection 02/01/2019   7/27, symptomatic   Gestational diabetes    Headache    History of cold sores 11/17/2020   HSV (herpes simplex virus) anogenital infection    oral    Hypothyroidism    on meds currently   Pregnancy induced hypertension    Vaginal Pap smear, abnormal    Social History   Socioeconomic History   Marital status: Single    Spouse name: Lexicographer   Number of children: Not on file   Years of education: Not on file   Highest education level: Associate degree: academic program   Occupational History   Not on file  Tobacco Use   Smoking status: Never   Smokeless tobacco: Never  Vaping Use   Vaping status: Never Used  Substance and Sexual Activity   Alcohol use: No   Drug use: No   Sexual activity: Yes    Partners: Male    Birth control/protection: None  Other Topics Concern   Not on file  Social History Narrative   Not on file   Social Drivers of Health   Financial Resource Strain: Low Risk  (01/27/2024)   Overall Financial Resource Strain (CARDIA)    Difficulty of Paying Living Expenses: Not hard at all  Food Insecurity: No Food Insecurity (01/27/2024)   Hunger Vital Sign    Worried About Running Out of Food in the Last Year: Never true    Ran Out of Food in the Last Year: Never true  Transportation Needs: No Transportation Needs (01/27/2024)   PRAPARE - Administrator, Civil Service (Medical): No    Lack of Transportation (Non-Medical): No  Physical Activity: Sufficiently Active (01/27/2024)   Exercise Vital Sign    Days of Exercise per Week: 5 days    Minutes of Exercise per Session: 30 min  Stress: No Stress Concern Present (01/27/2024)   [employer] of Occupational Health - Occupational Stress Questionnaire    Feeling of Stress: Not at all  Social Connections: Socially Integrated (01/27/2024)   Social Connection and Isolation Panel  Frequency of Communication with Friends and Family: More than three times a week    Frequency of Social Gatherings with Friends and Family: Twice a week    Attends Religious Services: More than 4 times per year    Active Member of [employer] or Organizations: Yes    Attends Banker Meetings: 1 to 4 times per year    Marital Status: Married  Catering Manager Violence: Not At Risk (06/03/2023)   Humiliation, Afraid, Rape, and Kick questionnaire    Fear of Current or Ex-Partner: No    Emotionally Abused: No    Physically Abused: No    Sexually Abused: No   Family History  Problem  Relation Age of Onset   Hypertension Father    No Known Allergies  Review of Systems  Constitutional:  Negative for chills and fever.  HENT:  Positive for congestion, ear pain and sore throat. Negative for ear discharge.   Eyes: Negative.   Respiratory:  Negative for cough and shortness of breath.   Cardiovascular:  Negative for chest pain.  Gastrointestinal:  Negative for nausea and vomiting.  Genitourinary: Negative.   Musculoskeletal: Negative.   Skin: Negative.   Neurological: Negative.   Endo/Heme/Allergies: Negative.   Psychiatric/Behavioral: Negative.        Objective:     BP (!) 176/90 (BP Location: Left Arm, Patient Position: Sitting)   Pulse (!) 58   LMP 05/08/2024 (Exact Date)   SpO2 98%  BP Readings from Last 3 Encounters:  06/01/24 (!) 176/90  05/24/24 (!) 156/80  04/07/24 (!) 140/78   Wt Readings from Last 3 Encounters:  05/24/24 (!) 306 lb (138.8 kg)  04/07/24 296 lb (134.3 kg)  03/22/24 294 lb (133.4 kg)    Physical Exam Vitals and nursing note reviewed.  Constitutional:      Appearance: Normal appearance.  HENT:     Head: Normocephalic and atraumatic.     Right Ear: Tenderness present. Tympanic membrane is perforated and erythematous.     Left Ear: Tympanic membrane, ear canal and external ear normal.     Nose: Nose normal.     Mouth/Throat:     Mouth: Mucous membranes are moist.     Pharynx: Oropharynx is clear.  Eyes:     Extraocular Movements: Extraocular movements intact.     Conjunctiva/sclera: Conjunctivae normal.     Pupils: Pupils are equal, round, and reactive to light.  Cardiovascular:     Rate and Rhythm: Normal rate and regular rhythm.     Pulses: Normal pulses.     Heart sounds: Normal heart sounds.  Pulmonary:     Effort: Pulmonary effort is normal.     Breath sounds: Normal breath sounds.  Musculoskeletal:        General: Normal range of motion.     Cervical back: Normal range of motion and neck supple.  Skin:     General: Skin is warm and dry.  Neurological:     General: No focal deficit present.     Mental Status: She is alert and oriented to person, place, and time.  Psychiatric:        Mood and Affect: Mood normal.        Behavior: Behavior normal.        Thought Content: Thought content normal.        Judgment: Judgment normal.        Assessment & Plan:   Problem List Items Addressed This Visit  Endocrine   Hypothyroidism   Relevant Medications   NP THYROID  90 MG tablet     Other   Elevated blood-pressure reading without diagnosis of hypertension   Other Visit Diagnoses       Acute otitis media, right    -  Primary   Relevant Medications   amoxicillin -clavulanate (AUGMENTIN ) 875-125 MG tablet   cetirizine  (ZYRTEC  ALLERGY) 10 MG tablet   Other Relevant Orders   Ambulatory referral to ENT       Assessment and Plan Otitis media, right ear Recurrent bacterial otitis media with severe pain / possible perforation  Patient education given on supportive care, red flags given for prompt reevaluation.  Recommended trial Zyrtec  - Prescribed Augmentin . - Referred to ENT for further evaluation.  Hypothyroidism Well-managed with normal thyroid  function tests. - Refilled thyroid  medication.   I have reviewed the patient's medical history (PMH, PSH, Social History, Family History, Medications, and allergies) , and have been updated if relevant. I spent 30 minutes reviewing chart and  face to face time with patient.       Return in about 2 weeks (around 06/15/2024) for With [facility].    Kirk RAMAN Mayers, PA-C

## 2024-06-01 NOTE — Patient Instructions (Signed)
 YOUR PLAN:  -OTITIS MEDIA, RIGHT EAR: Otitis media is an infection of the middle ear. You have a recurrent bacterial infection in your right ear causing severe pain. You have been prescribed Augmentin  to treat the infection and referred to an ENT specialist for further evaluation.  -HYPOTHYROIDISM: Hypothyroidism is a condition where your thyroid  gland does not produce enough thyroid  hormone. Your condition is well-managed, and your thyroid  function tests are normal. Your thyroid  medication has been refilled.  -ESSENTIAL HYPERTENSION: Essential hypertension is high blood pressure with no identifiable cause. Your blood pressure is controlled with your current medication.  -ALLERGIC RHINITIS: Allergic rhinitis is an allergic reaction that causes nasal congestion, which can contribute to ear infections. You are advised to take Zyrtec  daily to help manage your symptoms.  Eardrum Rupture, Adult  An eardrum rupture is a hole (perforation) in the eardrum. The eardrum is a thin, round tissue inside of the ear that separates the ear canal from the middle ear. The eardrum is also called the tympanic membrane. It transfers sound vibrations through small bones in the middle ear to the hearing nerve in the inner ear. It also protects the middle ear from germs. An eardrum rupture can cause pain and hearing loss. What are the causes? This condition may be caused by: An infection. A sudden injury, such as from: Inserting a thin, sharp object into the ear. A hit to the side of the head, especially by an open hand. Falling onto water or a flat surface. A rapid change in pressure, such as from flying or scuba diving. A sudden increase in pressure against the eardrum, such as from an explosion or a very loud noise. Inserting a cotton-tipped swab in the ear. A long-term eustachian tube disorder. Eustachian tubes are parts of the body that connect each middle ear space to the back of the nose. A medical procedure  or surgery, such as a procedure to remove wax from the ear canal. Removing a pressure equalization tube(PE tube) that was surgically placed through the eardrum. Having a PE tube fall out. What increases the risk? You are more likely to develop this condition if: You have had PE tubes inserted in your ears. You have an ear infection. You play sports that: Involve balls or contact with other players. Take place in water, such as diving, scuba diving, or waterskiing. What are the signs or symptoms? Symptoms of this condition include: Sudden pain at the time of the injury. Ear pain that suddenly improves. Ringing in the ear after the injury. Drainage from the ear. The drainage may be clear, cloudy or pus-like, or bloody. Hearing loss. Dizziness. How is this diagnosed? This condition is diagnosed based on your symptoms and medical history as well as a physical exam. Your health care provider can usually see a perforation using an ear scope (otoscope). You may have tests, such as: A hearing test (audiogram) to check for hearing loss. A test in which a sample of ear drainage is tested for infection (culture). How is this treated? An eardrum typically heals on its own within a few weeks. If your eardrum does not heal, your health care provider may recommend a procedure to place a patch over your eardrum or surgery to repair your eardrum. Your health care provider may also prescribe antibiotic medicines to help prevent infection. If the ear heals completely, any hearing loss should be temporary. Follow these instructions at home: Medicines Take over-the-counter and prescription medicines only as told by your health care  provider. If you were prescribed an antibiotic medicine, use it as told by your health care provider. Do not stop using the antibiotic even if you start to feel better. Ear care Keep your ear dry. This is very important. Follow instructions from your health care provider about  how to keep your ear dry. You may need to wear waterproof earplugs when bathing and swimming. If directed, apply heat to your affected ear as often as told by your health care provider. Use the heat source that your health care provider recommends, such as a moist heat pack or a heating pad. This will help to relieve pain. Place a towel between your skin and the heat source. Leave the heat on for 20-30 minutes. Remove the heat if your skin turns bright red. This is especially important if you are unable to feel pain, heat, or cold. You have a greater risk of getting burned. General instructions Return to sports and activities as told by your health care provider. Ask your health care provider what activities are safe for you. Wear headgear with ear protection when you play sports in which ear injuries are common. Talk to your health care provider before traveling by plane. Keep all follow-up visits. This is important. Contact a health care provider if: You have a fever. You have ear pain. You have mucus or blood draining from your ear. You have hearing loss, dizziness, or ringing in your ear. Get help right away if: You have sudden hearing loss. You are very dizzy. You have severe ear pain. Your face feels weak or becomes limp (paralyzed). These symptoms may represent a serious problem that is an emergency. Do not wait to see if the symptoms will go away. Get medical help right away. Call your local emergency services (911 in the [place] Do not drive yourself to the hospital. Summary An eardrum rupture is a hole (perforation) in the eardrum that can cause pain and hearing loss. It is usually caused by a sudden injury to the ear. The eardrum will likely heal on its own within a few weeks. In some cases, surgery may be necessary. Follow instructions from your health care provider about how to keep your ear dry as it heals. This information is not intended to replace advice given to you by your  health care provider. Make sure you discuss any questions you have with your health care provider. Document Revised: 05/12/2023 Document Reviewed: 05/12/2023 Elsevier Patient Education  2024 [employer].

## 2024-06-02 ENCOUNTER — Encounter (INDEPENDENT_AMBULATORY_CARE_PROVIDER_SITE_OTHER): Payer: Self-pay

## 2024-06-07 ENCOUNTER — Telehealth (INDEPENDENT_AMBULATORY_CARE_PROVIDER_SITE_OTHER): Payer: Self-pay | Admitting: Family Medicine

## 2024-06-07 NOTE — Telephone Encounter (Signed)
 Pt to stop in the office to make payment and pick up packet and get np appt scheduled.

## 2024-06-07 NOTE — Telephone Encounter (Deleted)
 New Patient Script Check List Patient Acknowledges Each Item Below:  []   There is a one-time $99.00 Program Fee.  []   You must have a Body Mass Index (BMI) of 30 or higher to qualify for our program.  []   Once you become a New Patient and schedule your first appointment, you are responsible for completing our New Patient Packet.  []   Prior to the first appointment, you will be required to fast for eight hours  []   You must also drink plenty of water  that day before and morning of your new patient appointment.  []   You will also have an electrocardiogram (EKG) during your New Patient visit  do not apply any lotions or creams prior to the appointment.   []   You will also have a metabolic breathing test known as the Indirect Calorimetry (IC) Test  []   We are a Specialty office, but we bill as a Primary Care. You will have the same co-payments and co-insurances as you would at your primary care provider's office.  []   We have a 5-minute grace period. If you arrive later than this time, we will need to reschedule your appointment to a later time or date.  []   There is a $50 fee if the initial consultation visit, New Patient Appointment, or First Follow-up Appointment is a no-show, so please make sure to pick a time that works with your schedule.   []   We recommend that all our patients sign up for and utilize [employer].

## 2024-06-12 LAB — VITAMIN D 1,25 DIHYDROXY
Vitamin D 1, 25 (OH)2 Total: 80 pg/mL — ABNORMAL HIGH
Vitamin D2 1, 25 (OH)2: <10 pg/mL
Vitamin D3 1, 25 (OH)2: 76 pg/mL

## 2024-06-12 LAB — C-REACTIVE PROTEIN: CRP: 4 mg/L (ref 0–10)

## 2024-06-12 LAB — VITAMIN B12: Vitamin B-12: 747 pg/mL (ref 232–1245)

## 2024-06-14 ENCOUNTER — Ambulatory Visit: Payer: Self-pay | Admitting: Obstetrics and Gynecology

## 2024-06-14 ENCOUNTER — Telehealth: Payer: Self-pay | Admitting: Physician Assistant

## 2024-06-14 NOTE — Telephone Encounter (Signed)
 Contacted patient to schedule follow up visit with [facility]. No answer/LVM

## 2024-07-06 ENCOUNTER — Encounter (INDEPENDENT_AMBULATORY_CARE_PROVIDER_SITE_OTHER): Payer: Self-pay | Admitting: Physician Assistant

## 2024-07-06 ENCOUNTER — Ambulatory Visit (INDEPENDENT_AMBULATORY_CARE_PROVIDER_SITE_OTHER): Admitting: Physician Assistant

## 2024-07-06 VITALS — BP 135/79 | HR 66 | Temp 98.1°F | Ht 63.0 in | Wt 298.0 lb

## 2024-07-06 DIAGNOSIS — H66016 Acute suppurative otitis media with spontaneous rupture of ear drum, recurrent, bilateral: Secondary | ICD-10-CM | POA: Diagnosis not present

## 2024-07-06 DIAGNOSIS — H6993 Unspecified Eustachian tube disorder, bilateral: Secondary | ICD-10-CM | POA: Diagnosis not present

## 2024-07-06 MED ORDER — FLUTICASONE PROPIONATE 50 MCG/ACT NA SUSP: 2.0000 | Freq: Every day | NASAL | 6 refills | Status: AC

## 2024-07-06 NOTE — Progress Notes (Unsigned)
 Patient explains that she drank an energy drink on her way here.

## 2024-07-07 NOTE — Progress Notes (Signed)
 Dear Dr. Darcus, Here is my assessment for our mutual patient, Marisa Campbell. Thank you for allowing me the opportunity to care for your patient. Please do not hesitate to contact me should you have any other questions. Sincerely, Chyrl Cohen PA-C  Otolaryngology Clinic Note Referring provider: Dr. Darcus HPI:  Marisa Campbell is a 43 y.o. female kindly referred by Dr. Darcus   Discussed the use of AI scribe software for clinical note transcription with the patient, who gave verbal consent to proceed.  History of Present Illness    MARCENE Campbell is a 43 year old female with recurrent acute suppurative otitis media, tympanic membrane rupture, and eustachian tube dysfunction who presents for evaluation of persistent right ear pain and recurrent ear infections.  She developed recurrent ear infections beginning in July 2025, initially following a week of upper respiratory symptoms including rhinorrhea and cough. The initial episode was characterized by bilateral otalgia, with severe right-sided pain awakening her at 3 AM, followed by a loud pop and subsequent otorrhea and otorrhagia from the right ear. She received oral antibiotics two days later.  Approximately one month after the initial episode, she experienced recurrent otalgia, which resolved with another course of antibiotics and otic drops. In October 2025, she had a third episode of otalgia and otorrhea, again treated with antibiotics and otic drops.  She is currently experiencing right-sided otalgia that began yesterday, following two weeks of influenza A infection. Associated symptoms include fever, herpes labialis, and ongoing viral symptoms in her household, though she is the only one with otologic involvement. The right ear was the last to rupture and remains the most symptomatic. She notes decreased hearing during infections affecting both ears and suspects baseline hearing loss.  She has a history of seasonal allergies  and has been taking daily cetirizine  for over a month. She has not used any otic medications recently. She tends to have prolonged illness when infected and has previously required blood transfusion and iron  infusions following postpartum hemorrhage in 2024.  She did not have any recent ear infections prior to July 2025, with only rare episodes in childhood. She suspects that sleeping on her right side may contribute to right ear symptoms. No recent left-sided otalgia.           Independent Review of Additional Tests or Records:  PCP office note 06/01/2024 - right ear pain    PMH/Meds/All/SocHx/FamHx/ROS:   Past Medical History:  Diagnosis Date   Anemia    COVID-19 01/31/2019   COVID-19 virus infection 02/01/2019   7/27, symptomatic   Gestational diabetes    Headache    History of cold sores 11/17/2020   HSV (herpes simplex virus) anogenital infection    oral    Hypothyroidism    on meds currently   Pregnancy induced hypertension    Vaginal Pap smear, abnormal      Past Surgical History:  Procedure Laterality Date   CESAREAN SECTION     CESAREAN SECTION N/A 06/01/2019   Procedure: CESAREAN SECTION;  Surgeon: Lenon Oneil BRAVO, MD;  Location: [facility] LD ORS;  Service: Obstetrics;  Laterality: N/A;    Family History  Problem Relation Age of Onset   Hypertension Father      Social Connections: Socially Integrated (01/27/2024)   Social Connection and Isolation Panel    Frequency of Communication with Friends and Family: More than three times a week    Frequency of Social Gatherings with Friends and Family: Twice a week    Attends  Religious Services: More than 4 times per year    Active Member of Clubs or Organizations: Yes    Attends Banker Meetings: 1 to 4 times per year    Marital Status: Married     Current Medications[1]   Physical Exam:   BP 135/79   Pulse 66   Temp 98.1 F (36.7 C)   Ht 5' 3 (1.6 m)   Wt 298 lb (135.2 kg)   SpO2 98%   BMI 52.79  kg/m   Pertinent Findings  CN II-XII grossly intact Bilateral EAC clear and TM intact, left serous effusion  Anterior rhinoscopy: Septum right deviation; bilateral inferior turbinates with mild hypertrophy  No lesions of oral cavity/oropharynx; dentition wnl No obviously palpable neck masses/lymphadenopathy/thyromegaly No respiratory distress or stridor   Seprately Identifiable Procedures:  None  Impression & Plans:  Marisa Campbell is a 43 y.o. 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 No current infection or perforation. - Ordered audiometry  - Instructed to contact office for acute otalgia, suspected infection, or tympanic membrane rupture for evaluation and possible antibiotics. - Discussed tympanostomy tube placement if recurrent infections or dysfunction confirmed.  Eustachian tube dysfunction Symptoms consistent with dysfunction, exacerbated by recent influenza and allergic rhinitis, contributing to effusion and infections. Improvement expected with inflammation and allergy management. - Recommended continuation of oral cetirizine . - Prescribed fluticasone nasal spray for inflammation reduction. - Advised saline sinus irrigation; provided Neilmed bottle. - Instructed to continue therapies for three months if no effusion on audiometry. - Scheduled follow-up phone visit to review audiometry results. - Advised in-person follow-up if effusion present on audiometry.           - f/u phone visit with audio results, in office visit with reoccurrence, otherwise three month follow up    Thank you for allowing me the opportunity to care for your patient. Please do not hesitate to contact me should you have any other questions.  Sincerely, Chyrl Cohen PA-C [facility] Phone:  [phone] Fax: [phone]  07/07/2024, 9:19 AM        [1]  Current Outpatient Medications:    ADVANCED EVENING PRIMROSE OIL PO, Take 1 capsule by mouth daily., Disp: , Rfl:    aspirin  EC 81 MG tablet, Take 81 mg by mouth daily. Swallow whole., Disp: , Rfl:    cetirizine  (ZYRTEC  ALLERGY) 10 MG tablet, Take 1 tablet (10 mg total) by mouth daily., Disp: 30 tablet, Rfl: 11   fluticasone (FLONASE) 50 MCG/ACT nasal spray, Place 2 sprays into both nostrils daily., Disp: 16 g, Rfl: 6   labetalol  (NORMODYNE ) 200 MG tablet, Take 1 tablet by mouth twice daily, Disp: 180 tablet, Rfl: 0   lactobacillus acidophilus (BACID) TABS tablet, Take 1 tablet by mouth daily. Probiotic, Disp: , Rfl:    Multiple Vitamins-Minerals (WOMENS MULTIVITAMIN PO), Take 1 tablet by mouth daily., Disp: , Rfl:    NP THYROID  90 MG tablet, Take 1 tablet (90 mg total) by mouth every morning., Disp: 90 tablet, Rfl: 0

## 2024-07-12 ENCOUNTER — Other Ambulatory Visit: Payer: Self-pay | Admitting: Primary Care

## 2024-08-09 ENCOUNTER — Ambulatory Visit (INDEPENDENT_AMBULATORY_CARE_PROVIDER_SITE_OTHER): Admitting: Audiology

## 2024-08-11 ENCOUNTER — Ambulatory Visit (INDEPENDENT_AMBULATORY_CARE_PROVIDER_SITE_OTHER): Admitting: Physician Assistant

## 2024-08-11 ENCOUNTER — Encounter: Payer: Self-pay | Admitting: Physician Assistant

## 2024-08-11 ENCOUNTER — Ambulatory Visit: Admitting: Physician Assistant

## 2024-08-11 VITALS — BP 127/70 | HR 62 | Ht 63.0 in | Wt 290.0 lb

## 2024-08-11 DIAGNOSIS — M79672 Pain in left foot: Secondary | ICD-10-CM | POA: Diagnosis not present

## 2024-08-11 DIAGNOSIS — H65194 Other acute nonsuppurative otitis media, recurrent, right ear: Secondary | ICD-10-CM | POA: Diagnosis not present

## 2024-08-11 DIAGNOSIS — I10 Essential (primary) hypertension: Secondary | ICD-10-CM | POA: Diagnosis not present

## 2024-08-11 DIAGNOSIS — M25572 Pain in left ankle and joints of left foot: Secondary | ICD-10-CM

## 2024-08-11 DIAGNOSIS — L308 Other specified dermatitis: Secondary | ICD-10-CM | POA: Diagnosis not present

## 2024-08-11 DIAGNOSIS — E039 Hypothyroidism, unspecified: Secondary | ICD-10-CM | POA: Diagnosis not present

## 2024-08-11 MED ORDER — TRIAMCINOLONE ACETONIDE 0.1 % EX CREA: 1.0000 | TOPICAL_CREAM | Freq: Two times a day (BID) | CUTANEOUS | 1 refills | Status: AC

## 2024-08-11 MED ORDER — LABETALOL HCL 200 MG PO TABS: 200.0000 mg | ORAL_TABLET | Freq: Two times a day (BID) | ORAL | 1 refills | Status: AC

## 2024-08-11 NOTE — Progress Notes (Signed)
 "  Established Patient Office Visit  Subjective   Patient ID: Marisa Campbell, female    DOB: 03/31/1981  Age: 44 y.o. MRN: [id number]  Chief Complaint  Patient presents with   Hypertension    Follow up on chronic health conditions and medication management   Discussed the use of AI scribe software for clinical note transcription with the patient, who gave verbal consent to proceed.  History of Present Illness   Marisa Campbell is a 44 year old female with hypertension who presents for a medication refill and ear pain evaluation.  She needs a refill of labetalol   200 mg twice daily for hypertension after her pharmacy requested an in-person visit.  She has chronic ear pressure in both ears. ENT previously found fluid in the left ear without infection or rupture. She uses Zyrtec , Flonase , and a Medipot for symptom control.  Four to five years ago she fell while pregnant with significant swelling and bruising in both feet. She still has chronic pain and swelling in the lateral left foot near the ankle, worsened by cold and activity. Aleve gives limited relief. Also wears compression socks   She has eczema on her hands and left wrist that flares with water exposure. Current OTC topical steroids are not controlling symptoms.  Since December she has increased exercise to about an hour daily and improved her diet with more salads and fewer carbohydrates. She has lost 15 pounds and feels well.  She takes thyroid  medication daily and requests thyroid  function testing.   Past Medical History:  Diagnosis Date   Anemia    COVID-19 01/31/2019   COVID-19 virus infection 02/01/2019   7/27, symptomatic   Gestational diabetes    Headache    History of cold sores 11/17/2020   HSV (herpes simplex virus) anogenital infection    oral    Hypothyroidism    on meds currently   Pregnancy induced hypertension    Vaginal Pap smear, abnormal    Social History   Socioeconomic History    Marital status: Single    Spouse name: Lexicographer   Number of children: Not on file   Years of education: Not on file   Highest education level: Associate degree: academic program  Occupational History   Not on file  Tobacco Use   Smoking status: Never   Smokeless tobacco: Never  Vaping Use   Vaping status: Never Used  Substance and Sexual Activity   Alcohol use: No   Drug use: No   Sexual activity: Yes    Partners: Male    Birth control/protection: None  Other Topics Concern   Not on file  Social History Narrative   Not on file   Social Drivers of Health   Tobacco Use: Low Risk (08/11/2024)   Patient History    Smoking Tobacco Use: Never    Smokeless Tobacco Use: Never    Passive Exposure: Not on file  Financial Resource Strain: Low Risk (01/27/2024)   Overall Financial Resource Strain (CARDIA)    Difficulty of Paying Living Expenses: Not hard at all  Food Insecurity: No Food Insecurity (08/11/2024)   Epic    Worried About Radiation Protection Practitioner of Food in the Last Year: Never true    Ran Out of Food in the Last Year: Never true  Transportation Needs: No Transportation Needs (08/11/2024)   Epic    Lack of Transportation (Medical): No    Lack of Transportation (Non-Medical): No  Physical Activity: Sufficiently Active (01/27/2024)  Exercise Vital Sign    Days of Exercise per Week: 5 days    Minutes of Exercise per Session: 30 min  Stress: No Stress Concern Present (01/27/2024)   [employer] of Occupational Health - Occupational Stress Questionnaire    Feeling of Stress: Not at all  Social Connections: Socially Integrated (01/27/2024)   Social Connection and Isolation Panel    Frequency of Communication with Friends and Family: More than three times a week    Frequency of Social Gatherings with Friends and Family: Twice a week    Attends Religious Services: More than 4 times per year    Active Member of [employer] or Organizations: Yes    Attends Banker Meetings:  1 to 4 times per year    Marital Status: Married  Catering Manager Violence: Not At Risk (08/11/2024)   Epic    Fear of Current or Ex-Partner: No    Emotionally Abused: No    Physically Abused: No    Sexually Abused: No  Depression (PHQ2-9): Low Risk (04/07/2024)   Depression (PHQ2-9)    PHQ-2 Score: 0  Alcohol Screen: Low Risk (06/03/2023)   Alcohol Screen    Last Alcohol Screening Score (AUDIT): 0  Housing: Low Risk (08/11/2024)   Epic    Unable to Pay for Housing in the Last Year: No    Number of Times Moved in the Last Year: 0    Homeless in the Last Year: No  Utilities: Not At Risk (08/11/2024)   Epic    Threatened with loss of utilities: No  Health Literacy: Adequate Health Literacy (06/03/2023)   B1300 Health Literacy    Frequency of need for help with medical instructions: Never   Family History  Problem Relation Age of Onset   Hypertension Father    Allergies[1]  Review of Systems  Constitutional: Negative.   HENT:  Positive for ear pain. Negative for ear discharge.   Eyes: Negative.   Respiratory:  Negative for shortness of breath.   Cardiovascular:  Negative for chest pain.  Gastrointestinal: Negative.   Genitourinary: Negative.   Musculoskeletal:  Positive for joint pain.  Skin: Negative.   Neurological: Negative.   Endo/Heme/Allergies: Negative.   Psychiatric/Behavioral: Negative.        Objective:     BP 127/70   Pulse 62   Ht 5' 3 (1.6 m)   Wt 290 lb (131.5 kg)   SpO2 98%   BMI 51.37 kg/m  BP Readings from Last 3 Encounters:  08/11/24 127/70  07/06/24 135/79  06/01/24 (!) 176/90   Wt Readings from Last 3 Encounters:  08/11/24 290 lb (131.5 kg)  07/06/24 298 lb (135.2 kg)  05/24/24 (!) 306 lb (138.8 kg)    Physical Exam Vitals and nursing note reviewed.      GENERAL: Alert, cooperative, well developed, no acute distress HEENT: Normocephalic, normal oropharynx, moist mucous membranes, no infection or perforation, debris on tympanic  membrane, possible fluid behind tympanic membrane, right ear normal structure and shine, left ear lacks shine CHEST: Clear to auscultation bilaterally, no wheezes, rhonchi, or crackles CARDIOVASCULAR: Normal heart rate and rhythm, S1 and S2 normal without murmurs ABDOMEN: Soft, non-tender, non-distended, without organomegaly, normal bowel sounds EXTREMITIES: No cyanosis or edema, DP pulses 2+ bilateral, slight pain with ROM testing left ankle  NEUROLOGICAL: Cranial nerves grossly intact, moves all extremities without gross motor or sensory deficit    Assessment & Plan:   Problem List Items Addressed This Visit  Endocrine   Hypothyroidism   Relevant Medications   labetalol  (NORMODYNE ) 200 MG tablet   Other Relevant Orders   Thyroid  Panel With TSH   Other Visit Diagnoses       Essential hypertension    -  Primary   Relevant Medications   labetalol  (NORMODYNE ) 200 MG tablet   Other Relevant Orders   Vitamin D , 25-hydroxy   Comp. Metabolic Panel (12)     Left foot pain       Relevant Orders   DG Foot Complete Left     Acute left ankle pain       Relevant Orders   DG Ankle Complete Left     Other eczema       Relevant Medications   triamcinolone  cream (KENALOG ) 0.1 %     Other recurrent acute nonsuppurative otitis media of right ear         1. Essential hypertension (Primary) Continue current regimen.  Patient encouraged to check blood pressure at home, keep a written log and have available for all office visits.  Red flags given for prompt reevaluation - labetalol  (NORMODYNE ) 200 MG tablet; Take 1 tablet (200 mg total) by mouth 2 (two) times daily.  Dispense: 60 tablet; Refill: 1 - Vitamin D , 25-hydroxy - Comp. Metabolic Panel (12)  2. Hypothyroidism, unspecified type Will refill thyroid  medication based on today's lab results. - Thyroid  Panel With TSH  3. Left foot pain Patient education given on supportive care - DG Foot Complete Left; Future  4. Acute left  ankle pain Patient education given on supportive care - DG Ankle Complete Left; Future  5. Other eczema Trial Kenalog .  Patient education given on supportive care - triamcinolone  cream (KENALOG ) 0.1 %; Apply 1 Application topically 2 (two) times daily.  Dispense: 80 g; Refill: 1  6. Other recurrent acute nonsuppurative otitis media of right ear Patient encouraged to follow-up with ENT.  Patient understands and agrees.    I have reviewed the patient's medical history (PMH, PSH, Social History, Family History, Medications, and allergies) , and have been updated if relevant. I spent 30 minutes reviewing chart and  face to face time with patient.    Return if symptoms worsen or fail to improve.    Katrice Goel S Mayers, PA-C      [1] No Known Allergies  "

## 2024-08-11 NOTE — Patient Instructions (Signed)
 VISIT SUMMARY:  During your visit, we addressed your hypertension, ear pain, chronic foot and ankle pain, eczema, and thyroid  function. Your blood pressure is well-controlled with your current medication, and we have refilled your prescription. We also discussed your chronic ear pressure, foot pain, and eczema, and provided recommendations for each. Additionally, we ordered tests to check your thyroid  function and will adjust your medication based on the results.  YOUR PLAN:  -ESSENTIAL HYPERTENSION: Hypertension is high blood pressure. Your blood pressure is currently well-controlled with your medication. We have refilled your prescription   -HYPOTHYROIDISM: Hypothyroidism is when your thyroid  gland does not produce enough thyroid  hormone. We have ordered tests to check your thyroid  function and will refill your medication based on the results.  -CHRONIC LEFT FOOT AND ANKLE PAIN: Chronic pain in your left foot and ankle may be due to a past injury or arthritis. We have ordered an x-ray of your left foot and ankle and recommend wearing compression socks during physical activity.  -RECURRENT OTITIS MEDIA WITH EUSTACHIAN TUBE DYSFUNCTION: This condition involves fluid buildup in the ear without infection, causing pressure and discomfort. Continue using Zyrtec  and Flonase  as recommended by the ENT. Contact the ENT if you experience acute pain or suspect an infection. A hearing test is scheduled for March.  -ECZEMA OF HANDS AND LEFT WRIST: Eczema is a skin condition that causes itchy and inflamed patches of skin. We have prescribed Kenalog  cream to help manage your symptoms. If there is no improvement, we may refer you to a dermatologist.

## 2024-08-12 ENCOUNTER — Ambulatory Visit: Payer: Self-pay | Admitting: Physician Assistant

## 2024-08-12 DIAGNOSIS — E039 Hypothyroidism, unspecified: Secondary | ICD-10-CM

## 2024-08-12 LAB — COMP. METABOLIC PANEL (12)
AST: 21 [IU]/L (ref 0–40)
Albumin: 4.3 g/dL (ref 3.9–4.9)
Alkaline Phosphatase: 71 [IU]/L (ref 41–116)
BUN/Creatinine Ratio: 26 — ABNORMAL HIGH (ref 9–23)
BUN: 18 mg/dL (ref 6–24)
Bilirubin Total: 0.3 mg/dL (ref 0.0–1.2)
Calcium: 9.5 mg/dL (ref 8.7–10.2)
Chloride: 104 mmol/L (ref 96–106)
Creatinine, Ser: 0.7 mg/dL (ref 0.57–1.00)
Globulin, Total: 2.9 g/dL (ref 1.5–4.5)
Glucose: 101 mg/dL — ABNORMAL HIGH (ref 70–99)
Potassium: 4.4 mmol/L (ref 3.5–5.2)
Sodium: 138 mmol/L (ref 134–144)
Total Protein: 7.2 g/dL (ref 6.0–8.5)
eGFR: 109 mL/min/{1.73_m2}

## 2024-08-12 LAB — THYROID PANEL WITH TSH
Free Thyroxine Index: 1.7 (ref 1.2–4.9)
T3 Uptake Ratio: 27 % (ref 24–39)
T4, Total: 6.4 ug/dL (ref 4.5–12.0)
TSH: 0.826 u[IU]/mL (ref 0.450–4.500)

## 2024-08-12 LAB — VITAMIN D 25 HYDROXY (VIT D DEFICIENCY, FRACTURES): Vit D, 25-Hydroxy: 22.7 ng/mL — ABNORMAL LOW (ref 30.0–100.0)

## 2024-08-12 MED ORDER — NP THYROID 90 MG PO TABS: 90.0000 mg | ORAL_TABLET | Freq: Every morning | ORAL | 1 refills | Status: AC

## 2024-08-13 ENCOUNTER — Ambulatory Visit (HOSPITAL_BASED_OUTPATIENT_CLINIC_OR_DEPARTMENT_OTHER): Admission: RE | Admit: 2024-08-13 | Source: Ambulatory Visit | Admitting: Radiology

## 2024-08-13 DIAGNOSIS — M25572 Pain in left ankle and joints of left foot: Secondary | ICD-10-CM

## 2024-08-13 DIAGNOSIS — M79672 Pain in left foot: Secondary | ICD-10-CM

## 2024-09-10 ENCOUNTER — Ambulatory Visit (INDEPENDENT_AMBULATORY_CARE_PROVIDER_SITE_OTHER): Admitting: Physician Assistant

## 2024-09-10 ENCOUNTER — Ambulatory Visit (INDEPENDENT_AMBULATORY_CARE_PROVIDER_SITE_OTHER): Admitting: Audiology
